# Patient Record
Sex: Female | Born: 1995 | Race: White | Hispanic: No | Marital: Single | Smoking: Never smoker
Health system: Southern US, Community
[De-identification: ages and names within clinical notes are randomized; demographics above are authoritative.]

## PROBLEM LIST (undated history)

## (undated) DIAGNOSIS — R51 Headache: Secondary | ICD-10-CM

## (undated) DIAGNOSIS — H9190 Unspecified hearing loss, unspecified ear: Secondary | ICD-10-CM

## (undated) HISTORY — DX: Headache: R51

## (undated) HISTORY — DX: Unspecified hearing loss, unspecified ear: H91.90

## (undated) HISTORY — PX: INNER EAR SURGERY: SHX679

---
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
## 2014-02-26 ENCOUNTER — Telehealth: Payer: Self-pay | Admitting: *Deleted

## 2014-02-26 DIAGNOSIS — G43919 Migraine, unspecified, intractable, without status migrainosus: Secondary | ICD-10-CM

## 2014-02-26 MED ORDER — SUMATRIPTAN SUCCINATE 100 MG PO TABS: 100.0000 mg | ORAL_TABLET | ORAL | Status: DC | PRN

## 2014-02-26 NOTE — Telephone Encounter (Signed)
I have spoke with patient and let her know that we are starting her on Imitrex because her insurance will not pay for the Maxalt.  I have sent in the Imitrex to her pharmacy.

## 2014-02-26 NOTE — Telephone Encounter (Signed)
This is the wrong patient

## 2014-05-28 ENCOUNTER — Ambulatory Visit (INDEPENDENT_AMBULATORY_CARE_PROVIDER_SITE_OTHER): Payer: Self-pay | Admitting: Nurse Practitioner

## 2014-05-28 ENCOUNTER — Encounter: Payer: Self-pay | Admitting: Nurse Practitioner

## 2014-05-28 VITALS — BP 112/82 | HR 84 | Ht <= 58 in | Wt 135.0 lb

## 2014-05-28 DIAGNOSIS — N946 Dysmenorrhea, unspecified: Secondary | ICD-10-CM

## 2014-05-28 DIAGNOSIS — Z308 Encounter for other contraceptive management: Secondary | ICD-10-CM

## 2014-05-28 MED ORDER — NORETHINDRONE ACET-ETHINYL EST 1.5-30 MG-MCG PO TABS: 1.0000 | ORAL_TABLET | Freq: Every day | ORAL | Status: DC

## 2014-05-28 NOTE — Progress Notes (Signed)
History:  Yvonne Hood is a 18 y.o. No obstetric history on file. who presents to clinic today for follow up on migraine and birth control. She has neen doing very well with migraine and takes Imitrex when she gets one. She gets about 1-2 migraines per month. She needs her BCPs refilled. She has been off this past 2 weeks. She is sexually active with the same partner for one year. They also use condoms. She will consider the Nuvaring study and will take some samples of Nuvaring.  The following portions of the patient's history were reviewed and updated as appropriate: allergies, current medications, past family history, past medical history, past social history, past surgical history and problem list.  Review of Systems:    Objective:  Physical Exam BP 112/82 mmHg  Pulse 84  Ht 4\' 10"  (1.473 m)  Wt 135 lb (61.236 kg)  BMI 28.22 kg/m2  LMP 04/30/2014 (Approximate) GENERAL: Well-developed, well-nourished female in no acute distress.  HEENT: Normocephalic, atraumatic.  NECK: Supple. Normal thyroid.  LUNGS: Normal rate. Clear to auscultation bilaterally.  HEART: Regular rate and rhythm with no adventitious sounds.  EXTREMITIES: No cyanosis, clubbing, or edema, 2+ distal pulses.   Labs and Imaging No results found.  Assessment & Plan:  Assessment:  Migraine without Aura Contraception Management  Plans:  Doing well with migraine/ no refills needed Refill Loestrin BCP x 1 year Nuvaring # 3 samples/ she is aware she will use one or the other RTC 1 year or prn   Delbert PhenixLinda M Seiya Silsby, NP 05/28/2014 1:46 PM

## 2014-05-28 NOTE — Patient Instructions (Signed)

## 2014-09-03 ENCOUNTER — Emergency Department: Payer: Self-pay | Admitting: Student

## 2014-11-29 ENCOUNTER — Emergency Department
Admission: EM | Admit: 2014-11-29 | Discharge: 2014-11-29 | Discharge status: Against Medical Advice | Payer: Medicaid Other | Attending: Emergency Medicine | Admitting: Emergency Medicine

## 2014-11-29 ENCOUNTER — Encounter: Payer: Self-pay | Admitting: Emergency Medicine

## 2014-11-29 DIAGNOSIS — Z3201 Encounter for pregnancy test, result positive: Secondary | ICD-10-CM | POA: Insufficient documentation

## 2014-11-29 DIAGNOSIS — R109 Unspecified abdominal pain: Secondary | ICD-10-CM | POA: Diagnosis not present

## 2014-11-29 DIAGNOSIS — R110 Nausea: Secondary | ICD-10-CM | POA: Insufficient documentation

## 2014-11-29 LAB — HCG, QUANTITATIVE, PREGNANCY: hCG, Beta Chain, Quant, S: 111066 m[IU]/mL — ABNORMAL HIGH (ref <5)

## 2014-11-29 LAB — POCT PREGNANCY, URINE: PREG TEST UR: POSITIVE — AB

## 2014-11-29 LAB — COMPREHENSIVE METABOLIC PANEL
ALK PHOS: 70 U/L (ref 38–126)
ALT: 15 U/L (ref 14–54)
AST: 18 U/L (ref 15–41)
Albumin: 4 g/dL (ref 3.5–5.0)
Anion gap: 9 (ref 5–15)
BUN: 12 mg/dL (ref 6–20)
CHLORIDE: 106 mmol/L (ref 101–111)
CO2: 23 mmol/L (ref 22–32)
Calcium: 9.3 mg/dL (ref 8.9–10.3)
Creatinine, Ser: 0.68 mg/dL (ref 0.44–1.00)
GFR calc Af Amer: >60 mL/min (ref >60)
GLUCOSE: 97 mg/dL (ref 65–99)
POTASSIUM: 3.5 mmol/L (ref 3.5–5.1)
SODIUM: 138 mmol/L (ref 135–145)
Total Bilirubin: 0.5 mg/dL (ref 0.3–1.2)
Total Protein: 7 g/dL (ref 6.5–8.1)

## 2014-11-29 LAB — CBC WITH DIFFERENTIAL/PLATELET
BASOS PCT: 0 %
Basophils Absolute: 0 10*3/uL (ref 0–0.1)
EOS ABS: 0.1 10*3/uL (ref 0–0.7)
Eosinophils Relative: 1 %
HCT: 41 % (ref 35.0–47.0)
HEMOGLOBIN: 13.8 g/dL (ref 12.0–16.0)
Lymphocytes Relative: 22 %
Lymphs Abs: 3.5 10*3/uL (ref 1.0–3.6)
MCH: 29.5 pg (ref 26.0–34.0)
MCHC: 33.6 g/dL (ref 32.0–36.0)
MCV: 87.8 fL (ref 80.0–100.0)
Monocytes Absolute: 1.1 10*3/uL — ABNORMAL HIGH (ref 0.2–0.9)
Monocytes Relative: 7 %
Neutro Abs: 10.8 10*3/uL — ABNORMAL HIGH (ref 1.4–6.5)
Neutrophils Relative %: 70 %
PLATELETS: 370 10*3/uL (ref 150–440)
RBC: 4.67 MIL/uL (ref 3.80–5.20)
RDW: 12.6 % (ref 11.5–14.5)
WBC: 15.6 10*3/uL — ABNORMAL HIGH (ref 3.6–11.0)

## 2014-11-29 LAB — URINALYSIS COMPLETE WITH MICROSCOPIC (ARMC ONLY)
Bacteria, UA: NONE SEEN
Bilirubin Urine: NEGATIVE
Glucose, UA: NEGATIVE mg/dL
Hgb urine dipstick: NEGATIVE
KETONES UR: NEGATIVE mg/dL
Nitrite: NEGATIVE
PH: 5 (ref 5.0–8.0)
Protein, ur: NEGATIVE mg/dL
RBC / HPF: NONE SEEN RBC/hpf (ref 0–5)
SPECIFIC GRAVITY, URINE: 1.03 (ref 1.005–1.030)

## 2014-11-29 LAB — LIPASE, BLOOD: Lipase: 30 U/L (ref 22–51)

## 2014-11-29 NOTE — ED Notes (Signed)
Called no answer in lobby  

## 2014-11-29 NOTE — ED Notes (Signed)
Called times 2  No answer in lobby 

## 2014-11-29 NOTE — ED Notes (Signed)
Pt to ed with c/o abd pain x 2 weeks with nausea.  Pt denies diarrhea states last BM was 2 night ago and normal.

## 2014-12-02 ENCOUNTER — Telehealth: Payer: Self-pay | Admitting: Emergency Medicine

## 2014-12-02 NOTE — ED Notes (Signed)
Called patient due to lwot to inquire about condition and follow up plans. Left message.   

## 2014-12-17 ENCOUNTER — Encounter: Payer: Self-pay | Admitting: Family Medicine

## 2014-12-17 ENCOUNTER — Other Ambulatory Visit: Payer: Self-pay | Admitting: Family Medicine

## 2014-12-17 ENCOUNTER — Ambulatory Visit (INDEPENDENT_AMBULATORY_CARE_PROVIDER_SITE_OTHER): Payer: Medicaid Other | Admitting: Family Medicine

## 2014-12-17 VITALS — BP 95/65 | HR 88 | Wt 137.0 lb

## 2014-12-17 DIAGNOSIS — Z3401 Encounter for supervision of normal first pregnancy, first trimester: Secondary | ICD-10-CM

## 2014-12-17 DIAGNOSIS — Z3400 Encounter for supervision of normal first pregnancy, unspecified trimester: Secondary | ICD-10-CM | POA: Insufficient documentation

## 2014-12-17 LAB — OB RESULTS CONSOLE GC/CHLAMYDIA
Chlamydia: NEGATIVE
Gonorrhea: NEGATIVE

## 2014-12-17 NOTE — Patient Instructions (Signed)
First Trimester of Pregnancy The first trimester of pregnancy is from week 1 until the end of week 12 (months 1 through 3). A week after a sperm fertilizes an egg, the egg will implant on the wall of the uterus. This embryo will begin to develop into a baby. Genes from you and your partner are forming the baby. The female genes determine whether the baby is a boy or a girl. At 6-8 weeks, the eyes and face are formed, and the heartbeat can be seen on ultrasound. At the end of 12 weeks, all the baby's organs are formed.  Now that you are pregnant, you will want to do everything you can to have a healthy baby. Two of the most important things are to get good prenatal care and to follow your health care provider's instructions. Prenatal care is all the medical care you receive before the baby's birth. This care will help prevent, find, and treat any problems during the pregnancy and childbirth. BODY CHANGES Your body goes through many changes during pregnancy. The changes vary from woman to woman.   You may gain or lose a couple of pounds at first.  You may feel sick to your stomach (nauseous) and throw up (vomit). If the vomiting is uncontrollable, call your health care provider.  You may tire easily.  You may develop headaches that can be relieved by medicines approved by your health care provider.  You may urinate more often. Painful urination may mean you have a bladder infection.  You may develop heartburn as a result of your pregnancy.  You may develop constipation because certain hormones are causing the muscles that push waste through your intestines to slow down.  You may develop hemorrhoids or swollen, bulging veins (varicose veins).  Your breasts may begin to grow larger and become tender. Your nipples may stick out more, and the tissue that surrounds them (areola) may become darker.  Your gums may bleed and may be sensitive to brushing and flossing.  Dark spots or blotches  (chloasma, mask of pregnancy) may develop on your face. This will likely fade after the baby is born.  Your menstrual periods will stop.  You may have a loss of appetite.  You may develop cravings for certain kinds of food.  You may have changes in your emotions from day to day, such as being excited to be pregnant or being concerned that something may go wrong with the pregnancy and baby.  You may have more vivid and strange dreams.  You may have changes in your hair. These can include thickening of your hair, rapid growth, and changes in texture. Some women also have hair loss during or after pregnancy, or hair that feels dry or thin. Your hair will most likely return to normal after your baby is born. WHAT TO EXPECT AT YOUR PRENATAL VISITS During a routine prenatal visit:  You will be weighed to make sure you and the baby are growing normally.  Your blood pressure will be taken.  Your abdomen will be measured to track your baby's growth.  The fetal heartbeat will be listened to starting around week 10 or 12 of your pregnancy.  Test results from any previous visits will be discussed. Your health care provider may ask you:  How you are feeling.  If you are feeling the baby move.  If you have had any abnormal symptoms, such as leaking fluid, bleeding, severe headaches, or abdominal cramping.  If you have any questions. Other tests   that may be performed during your first trimester include:  Blood tests to find your blood type and to check for the presence of any previous infections. They will also be used to check for low iron levels (anemia) and Rh antibodies. Later in the pregnancy, blood tests for diabetes will be done along with other tests if problems develop.  Urine tests to check for infections, diabetes, or protein in the urine.  An ultrasound to confirm the proper growth and development of the baby.  An amniocentesis to check for possible genetic problems.  Fetal  screens for spina bifida and Down syndrome.  You may need other tests to make sure you and the baby are doing well. HOME CARE INSTRUCTIONS  Medicines  Follow your health care provider's instructions regarding medicine use. Specific medicines may be either safe or unsafe to take during pregnancy.  Take your prenatal vitamins as directed.  If you develop constipation, try taking a stool softener if your health care provider approves. Diet  Eat regular, well-balanced meals. Choose a variety of foods, such as meat or vegetable-based protein, fish, milk and low-fat dairy products, vegetables, fruits, and whole grain breads and cereals. Your health care provider will help you determine the amount of weight gain that is right for you.  Avoid raw meat and uncooked cheese. These carry germs that can cause birth defects in the baby.  Eating four or five small meals rather than three large meals a day may help relieve nausea and vomiting. If you start to feel nauseous, eating a few soda crackers can be helpful. Drinking liquids between meals instead of during meals also seems to help nausea and vomiting.  If you develop constipation, eat more high-fiber foods, such as fresh vegetables or fruit and whole grains. Drink enough fluids to keep your urine clear or pale yellow. Activity and Exercise  Exercise only as directed by your health care provider. Exercising will help you:  Control your weight.  Stay in shape.  Be prepared for labor and delivery.  Experiencing pain or cramping in the lower abdomen or low back is a good sign that you should stop exercising. Check with your health care provider before continuing normal exercises.  Try to avoid standing for long periods of time. Move your legs often if you must stand in one place for a long time.  Avoid heavy lifting.  Wear low-heeled shoes, and practice good posture.  You may continue to have sex unless your health care provider directs you  otherwise. Relief of Pain or Discomfort  Wear a good support bra for breast tenderness.   Take warm sitz baths to soothe any pain or discomfort caused by hemorrhoids. Use hemorrhoid cream if your health care provider approves.   Rest with your legs elevated if you have leg cramps or low back pain.  If you develop varicose veins in your legs, wear support hose. Elevate your feet for 15 minutes, 3-4 times a day. Limit salt in your diet. Prenatal Care  Schedule your prenatal visits by the twelfth week of pregnancy. They are usually scheduled monthly at first, then more often in the last 2 months before delivery.  Write down your questions. Take them to your prenatal visits.  Keep all your prenatal visits as directed by your health care provider. Safety  Wear your seat belt at all times when driving.  Make a list of emergency phone numbers, including numbers for family, friends, the hospital, and police and fire departments. General Tips    Ask your health care provider for a referral to a local prenatal education class. Begin classes no later than at the beginning of month 6 of your pregnancy.  Ask for help if you have counseling or nutritional needs during pregnancy. Your health care provider can offer advice or refer you to specialists for help with various needs.  Do not use hot tubs, steam rooms, or saunas.  Do not douche or use tampons or scented sanitary pads.  Do not cross your legs for long periods of time.  Avoid cat litter boxes and soil used by cats. These carry germs that can cause birth defects in the baby and possibly loss of the fetus by miscarriage or stillbirth.  Avoid all smoking, herbs, alcohol, and medicines not prescribed by your health care provider. Chemicals in these affect the formation and growth of the baby.  Schedule a dentist appointment. At home, brush your teeth with a soft toothbrush and be gentle when you floss. SEEK MEDICAL CARE IF:   You have  dizziness.  You have mild pelvic cramps, pelvic pressure, or nagging pain in the abdominal area.  You have persistent nausea, vomiting, or diarrhea.  You have a bad smelling vaginal discharge.  You have pain with urination.  You notice increased swelling in your face, hands, legs, or ankles. SEEK IMMEDIATE MEDICAL CARE IF:   You have a fever.  You are leaking fluid from your vagina.  You have spotting or bleeding from your vagina.  You have severe abdominal cramping or pain.  You have rapid weight gain or loss.  You vomit blood or material that looks like coffee grounds.  You are exposed to German measles and have never had them.  You are exposed to fifth disease or chickenpox.  You develop a severe headache.  You have shortness of breath.  You have any kind of trauma, such as from a fall or a car accident. Document Released: 05/25/2001 Document Revised: 10/15/2013 Document Reviewed: 04/10/2013 [employer] Patient Information 2015 [employer]. This information is not intended to replace advice given to you by your health care provider. Make sure you discuss any questions you have with your health care provider.  Breastfeeding Deciding to breastfeed is one of the best choices you can make for you and your baby. A change in hormones during pregnancy causes your breast tissue to grow and increases the number and size of your milk ducts. These hormones also allow proteins, sugars, and fats from your blood supply to make breast milk in your milk-producing glands. Hormones prevent breast milk from being released before your baby is born as well as prompt milk flow after birth. Once breastfeeding has begun, thoughts of your baby, as well as his or her sucking or crying, can stimulate the release of milk from your milk-producing glands.  BENEFITS OF BREASTFEEDING For Your Baby  Your first milk (colostrum) helps your baby's digestive system function better.   There are antibodies  in your milk that help your baby fight off infections.   Your baby has a lower incidence of asthma, allergies, and sudden infant death syndrome.   The nutrients in breast milk are better for your baby than infant formulas and are designed uniquely for your baby's needs.   Breast milk improves your baby's brain development.   Your baby is less likely to develop other conditions, such as childhood obesity, asthma, or type 2 diabetes mellitus.  For You   Breastfeeding helps to create a very special bond between   you and your baby.   Breastfeeding is convenient. Breast milk is always available at the correct temperature and costs nothing.   Breastfeeding helps to burn calories and helps you lose the weight gained during pregnancy.   Breastfeeding makes your uterus contract to its prepregnancy size faster and slows bleeding (lochia) after you give birth.   Breastfeeding helps to lower your risk of developing type 2 diabetes mellitus, osteoporosis, and breast or ovarian cancer later in life. SIGNS THAT YOUR BABY IS HUNGRY Early Signs of Hunger  Increased alertness or activity.  Stretching.  Movement of the head from side to side.  Movement of the head and opening of the mouth when the corner of the mouth or cheek is stroked (rooting).  Increased sucking sounds, smacking lips, cooing, sighing, or squeaking.  Hand-to-mouth movements.  Increased sucking of fingers or hands. Late Signs of Hunger  Fussing.  Intermittent crying. Extreme Signs of Hunger Signs of extreme hunger will require calming and consoling before your baby will be able to breastfeed successfully. Do not wait for the following signs of extreme hunger to occur before you initiate breastfeeding:   Restlessness.  A loud, strong cry.   Screaming. BREASTFEEDING BASICS Breastfeeding Initiation  Find a comfortable place to sit or lie down, with your neck and back well supported.  Place a pillow or  rolled up blanket under your baby to bring him or her to the level of your breast (if you are seated). Nursing pillows are specially designed to help support your arms and your baby while you breastfeed.  Make sure that your baby's abdomen is facing your abdomen.   Gently massage your breast. With your fingertips, massage from your chest wall toward your nipple in a circular motion. This encourages milk flow. You may need to continue this action during the feeding if your milk flows slowly.  Support your breast with 4 fingers underneath and your thumb above your nipple. Make sure your fingers are well away from your nipple and your baby's mouth.   Stroke your baby's lips gently with your finger or nipple.   When your baby's mouth is open wide enough, quickly bring your baby to your breast, placing your entire nipple and as much of the colored area around your nipple (areola) as possible into your baby's mouth.   More areola should be visible above your baby's upper lip than below the lower lip.   Your baby's tongue should be between his or her lower gum and your breast.   Ensure that your baby's mouth is correctly positioned around your nipple (latched). Your baby's lips should create a seal on your breast and be turned out (everted).  It is common for your baby to suck about 2-3 minutes in order to start the flow of breast milk. Latching Teaching your baby how to latch on to your breast properly is very important. An improper latch can cause nipple pain and decreased milk supply for you and poor weight gain in your baby. Also, if your baby is not latched onto your nipple properly, he or she may swallow some air during feeding. This can make your baby fussy. Burping your baby when you switch breasts during the feeding can help to get rid of the air. However, teaching your baby to latch on properly is still the best way to prevent fussiness from swallowing air while breastfeeding. Signs  that your baby has successfully latched on to your nipple:    Silent tugging or silent   sucking, without causing you pain.   Swallowing heard between every 3-4 sucks.    Muscle movement above and in front of his or her ears while sucking.  Signs that your baby has not successfully latched on to nipple:   Sucking sounds or smacking sounds from your baby while breastfeeding.  Nipple pain. If you think your baby has not latched on correctly, slip your finger into the corner of your baby's mouth to break the suction and place it between your baby's gums. Attempt breastfeeding initiation again. Signs of Successful Breastfeeding Signs from your baby:   A gradual decrease in the number of sucks or complete cessation of sucking.   Falling asleep.   Relaxation of his or her body.   Retention of a small amount of milk in his or her mouth.   Letting go of your breast by himself or herself. Signs from you:  Breasts that have increased in firmness, weight, and size 1-3 hours after feeding.   Breasts that are softer immediately after breastfeeding.  Increased milk volume, as well as a change in milk consistency and color by the fifth day of breastfeeding.   Nipples that are not sore, cracked, or bleeding. Signs That Your Baby is Getting Enough Milk  Wetting at least 3 diapers in a 24-hour period. The urine should be clear and pale yellow by age 5 days.  At least 3 stools in a 24-hour period by age 5 days. The stool should be soft and yellow.  At least 3 stools in a 24-hour period by age 7 days. The stool should be seedy and yellow.  No loss of weight greater than 10% of birth weight during the first 3 days of age.  Average weight gain of 4-7 ounces (113-198 g) per week after age 4 days.  Consistent daily weight gain by age 5 days, without weight loss after the age of 2 weeks. After a feeding, your baby may spit up a small amount. This is common. BREASTFEEDING FREQUENCY AND  DURATION Frequent feeding will help you make more milk and can prevent sore nipples and breast engorgement. Breastfeed when you feel the need to reduce the fullness of your breasts or when your baby shows signs of hunger. This is called "breastfeeding on demand." Avoid introducing a pacifier to your baby while you are working to establish breastfeeding (the first 4-6 weeks after your baby is born). After this time you may choose to use a pacifier. Research has shown that pacifier use during the first year of a baby's life decreases the risk of sudden infant death syndrome (SIDS). Allow your baby to feed on each breast as long as he or she wants. Breastfeed until your baby is finished feeding. When your baby unlatches or falls asleep while feeding from the first breast, offer the second breast. Because newborns are often sleepy in the first few weeks of life, you may need to awaken your baby to get him or her to feed. Breastfeeding times will vary from baby to baby. However, the following rules can serve as a guide to help you ensure that your baby is properly fed:  Newborns (babies 4 weeks of age or younger) may breastfeed every 1-3 hours.  Newborns should not go longer than 3 hours during the day or 5 hours during the night without breastfeeding.  You should breastfeed your baby a minimum of 8 times in a 24-hour period until you begin to introduce solid foods to your baby at around 6   months of age. BREAST MILK PUMPING Pumping and storing breast milk allows you to ensure that your baby is exclusively fed your breast milk, even at times when you are unable to breastfeed. This is especially important if you are going back to work while you are still breastfeeding or when you are not able to be present during feedings. Your lactation consultant can give you guidelines on how long it is safe to store breast milk.  A breast pump is a machine that allows you to pump milk from your breast into a sterile bottle.  The pumped breast milk can then be stored in a refrigerator or freezer. Some breast pumps are operated by hand, while others use electricity. Ask your lactation consultant which type will work best for you. Breast pumps can be purchased, but some hospitals and breastfeeding support groups lease breast pumps on a monthly basis. A lactation consultant can teach you how to hand express breast milk, if you prefer not to use a pump.  CARING FOR YOUR BREASTS WHILE YOU BREASTFEED Nipples can become dry, cracked, and sore while breastfeeding. The following recommendations can help keep your breasts moisturized and healthy:  Avoid using soap on your nipples.   Wear a supportive bra. Although not required, special nursing bras and tank tops are designed to allow access to your breasts for breastfeeding without taking off your entire bra or top. Avoid wearing underwire-style bras or extremely tight bras.  Air dry your nipples for 3-4minutes after each feeding.   Use only cotton bra pads to absorb leaked breast milk. Leaking of breast milk between feedings is normal.   Use lanolin on your nipples after breastfeeding. Lanolin helps to maintain your skin's normal moisture barrier. If you use pure lanolin, you do not need to wash it off before feeding your baby again. Pure lanolin is not toxic to your baby. You may also hand express a few drops of breast milk and gently massage that milk into your nipples and allow the milk to air dry. In the first few weeks after giving birth, some women experience extremely full breasts (engorgement). Engorgement can make your breasts feel heavy, warm, and tender to the touch. Engorgement peaks within 3-5 days after you give birth. The following recommendations can help ease engorgement:  Completely empty your breasts while breastfeeding or pumping. You may want to start by applying warm, moist heat (in the shower or with warm water-soaked hand towels) just before feeding or  pumping. This increases circulation and helps the milk flow. If your baby does not completely empty your breasts while breastfeeding, pump any extra milk after he or she is finished.  Wear a snug bra (nursing or regular) or tank top for 1-2 days to signal your body to slightly decrease milk production.  Apply ice packs to your breasts, unless this is too uncomfortable for you.  Make sure that your baby is latched on and positioned properly while breastfeeding. If engorgement persists after 48 hours of following these recommendations, contact your health care provider or a lactation consultant. OVERALL HEALTH CARE RECOMMENDATIONS WHILE BREASTFEEDING  Eat healthy foods. Alternate between meals and snacks, eating 3 of each per day. Because what you eat affects your breast milk, some of the foods may make your baby more irritable than usual. Avoid eating these foods if you are sure that they are negatively affecting your baby.  Drink milk, fruit juice, and water to satisfy your thirst (about 10 glasses a day).   Rest   often, relax, and continue to take your prenatal vitamins to prevent fatigue, stress, and anemia.  Continue breast self-awareness checks.  Avoid chewing and smoking tobacco.  Avoid alcohol and drug use. Some medicines that may be harmful to your baby can pass through breast milk. It is important to ask your health care provider before taking any medicine, including all over-the-counter and prescription medicine as well as vitamin and herbal supplements. It is possible to become pregnant while breastfeeding. If birth control is desired, ask your health care provider about options that will be safe for your baby. SEEK MEDICAL CARE IF:   You feel like you want to stop breastfeeding or have become frustrated with breastfeeding.  You have painful breasts or nipples.  Your nipples are cracked or bleeding.  Your breasts are red, tender, or warm.  You have a swollen area on either  breast.  You have a fever or chills.  You have nausea or vomiting.  You have drainage other than breast milk from your nipples.  Your breasts do not become full before feedings by the fifth day after you give birth.  You feel sad and depressed.  Your baby is too sleepy to eat well.  Your baby is having trouble sleeping.   Your baby is wetting less than 3 diapers in a 24-hour period.  Your baby has less than 3 stools in a 24-hour period.  Your baby's skin or the white part of his or her eyes becomes yellow.   Your baby is not gaining weight by 5 days of age. SEEK IMMEDIATE MEDICAL CARE IF:   Your baby is overly tired (lethargic) and does not want to wake up and feed.  Your baby develops an unexplained fever. Document Released: 05/31/2005 Document Revised: 06/05/2013 Document Reviewed: 11/22/2012 [employer] Patient Information 2015 [employer]. This information is not intended to replace advice given to you by your health care provider. Make sure you discuss any questions you have with your health care provider.  

## 2014-12-17 NOTE — Progress Notes (Signed)
   Subjective:    Yvonne Hood is a G1P0000 1488w6d being seen today for her first obstetrical visit.  Her obstetrical history is not significant. Pregnancy history fully reviewed.  Patient reports nausea and vomiting.  There were no vitals filed for this visit.  HISTORY: OB History  Gravida Para Term Preterm AB SAB TAB Ectopic Multiple Living  1 0 0 0 0 0 0 0 0 0     # Outcome Date GA Lbr Len/2nd Weight Sex Delivery Anes PTL Lv  1 Current              Past Medical History  Diagnosis Date  . ZOXWRUEA(540.9Headache(784.0)    Past Surgical History  Procedure Laterality Date  . Inner ear surgery      left ear   Family History  Problem Relation Age of Onset  . Diabetes Mother   . COPD Mother   . Heart disease Father      Exam    Uterus:   10 wk size  Pelvic Exam:    Perineum: Normal Perineum   Vulva: Bartholin's, Urethra, Skene's normal   Vagina:  normal mucosa, normal discharge   Cervix: no bleeding following Pap and no cervical motion tenderness   Adnexa: normal adnexa   Bony Pelvis: average  System: Breast:  normal appearance, no masses or tenderness   Skin: normal coloration and turgor, no rashes    Neurologic: oriented   Extremities: normal strength, tone, and muscle mass   HEENT sclera clear, anicteric   Mouth/Teeth mucous membranes moist, pharynx normal without lesions   Neck supple   Cardiovascular: regular rate and rhythm, no murmurs or gallops   Respiratory:  appears well, vitals normal, no respiratory distress, acyanotic, normal RR, ear and throat exam is normal, neck free of mass or lymphadenopathy, chest clear, no wheezing, crepitations, rhonchi, normal symmetric air entry   Abdomen: soft, non-tender; bowel sounds normal; no masses,  no organomegaly      Assessment:    Pregnancy: G1P0000 Patient Active Problem List   Diagnosis Date Noted  . Migraine without aura 04/25/2012  . Birth control 07/13/2011  . Adolescent dysmenorrhea 07/13/2011         Plan:     Initial labs drawn. Prenatal vitamins. Problem list reviewed and updated. Genetic Screening discussed First Screen: declined.  Follow up in 4 weeks.    Wava Kildow S 12/17/2014

## 2014-12-18 LAB — PRENATAL PROFILE (SOLSTAS)
ANTIBODY SCREEN: NEGATIVE
BASOS PCT: 0 % (ref 0–1)
Basophils Absolute: 0 10*3/uL (ref 0.0–0.1)
EOS ABS: 0.1 10*3/uL (ref 0.0–0.7)
EOS PCT: 1 % (ref 0–5)
HCT: 38.1 % (ref 36.0–46.0)
HEMOGLOBIN: 12.9 g/dL (ref 12.0–15.0)
HEP B S AG: NEGATIVE
HIV 1&2 Ab, 4th Generation: NONREACTIVE
LYMPHS ABS: 2.3 10*3/uL (ref 0.7–4.0)
Lymphocytes Relative: 21 % (ref 12–46)
MCH: 29.3 pg (ref 26.0–34.0)
MCHC: 33.9 g/dL (ref 30.0–36.0)
MCV: 86.6 fL (ref 78.0–100.0)
MPV: 9.8 fL (ref 8.6–12.4)
Monocytes Absolute: 0.8 10*3/uL (ref 0.1–1.0)
Monocytes Relative: 7 % (ref 3–12)
NEUTROS PCT: 71 % (ref 43–77)
Neutro Abs: 7.8 10*3/uL — ABNORMAL HIGH (ref 1.7–7.7)
Platelets: 346 10*3/uL (ref 150–400)
RBC: 4.4 MIL/uL (ref 3.87–5.11)
RDW: 13.2 % (ref 11.5–15.5)
RUBELLA: 0.98 {index} — AB (ref <0.90)
Rh Type: POSITIVE
WBC: 11 10*3/uL — ABNORMAL HIGH (ref 4.0–10.5)

## 2014-12-18 LAB — GC/CHLAMYDIA PROBE AMP
CT Probe RNA: NEGATIVE
GC Probe RNA: NEGATIVE

## 2014-12-18 LAB — CYSTIC FIBROSIS DIAGNOSTIC STUDY

## 2014-12-19 ENCOUNTER — Encounter: Payer: Self-pay | Admitting: Family Medicine

## 2014-12-19 DIAGNOSIS — O9989 Other specified diseases and conditions complicating pregnancy, childbirth and the puerperium: Secondary | ICD-10-CM

## 2014-12-19 DIAGNOSIS — Z283 Underimmunization status: Secondary | ICD-10-CM | POA: Insufficient documentation

## 2014-12-19 DIAGNOSIS — Z2839 Other underimmunization status: Secondary | ICD-10-CM | POA: Insufficient documentation

## 2014-12-19 LAB — HEMOGLOBINOPATHY EVALUATION
HGB A2 QUANT: 3 % (ref 2.2–3.2)
HGB A: 97 % (ref 96.8–97.8)
Hemoglobin Other: 0 %
Hgb F Quant: 0 % (ref 0.0–2.0)
Hgb S Quant: 0 %

## 2014-12-19 LAB — CULTURE, OB URINE: Colony Count: 9000

## 2015-01-14 ENCOUNTER — Ambulatory Visit (INDEPENDENT_AMBULATORY_CARE_PROVIDER_SITE_OTHER): Payer: Medicaid Other | Admitting: Obstetrics & Gynecology

## 2015-01-14 VITALS — BP 103/71 | HR 92 | Wt 131.0 lb

## 2015-01-14 DIAGNOSIS — O219 Vomiting of pregnancy, unspecified: Secondary | ICD-10-CM

## 2015-01-14 DIAGNOSIS — Z3402 Encounter for supervision of normal first pregnancy, second trimester: Secondary | ICD-10-CM

## 2015-01-14 MED ORDER — METOCLOPRAMIDE HCL 10 MG PO TABS: 10.0000 mg | ORAL_TABLET | Freq: Four times a day (QID) | ORAL | Status: DC | PRN

## 2015-01-14 NOTE — Progress Notes (Signed)
Subjective:  Yvonne Hood is a 19 y.o. G1P0000 at [redacted]w[redacted]d being seen today for ongoing prenatal care.  Patient reports that she sill has nausea and vomiting, usually vomits three times a day.    Vag. Bleeding: None. Movement: Absent. Denies leaking of fluid.   The following portions of the patient's history were reviewed and updated as appropriate: allergies, current medications, past family history, past medical history, past social history, past surgical history and problem list.   Objective:   Filed Vitals:   01/14/15 1333  BP: 103/71  Pulse: 92  Weight: 131 lb (59.421 kg)    Fetal Status: Fetal Heart Rate (bpm): 155   Movement: Absent     General:  Alert, oriented and cooperative. Patient is in no acute distress.  Skin: Skin is warm and dry. No rash noted.   Cardiovascular: Normal heart rate noted  Respiratory: Normal respiratory effort, no problems with respiration noted  Abdomen: Soft, gravid, appropriate for gestational age. Pain/Pressure: Absent     Vaginal: Vag. Bleeding: None.       Cervix: Not evaluated        Extremities: Normal range of motion.  Edema: None  Mental Status: Normal mood and affect. Normal behavior. Normal judgment and thought content.   Urinalysis: Urine Protein: Trace Urine Glucose: Negative  Assessment and Plan:  Pregnancy: G1P0000 at [redacted]w[redacted]d  1. Supervision of normal first pregnancy, antepartum, second trimester - US OB Comp + 14 Wk; Future - Declines quad screen  2. Nausea and vomiting of pregnancy, antepartum - metoCLOPramide (REGLAN) 10 MG tablet; Take 1 tablet (10 mg total) by mouth 4 (four) times daily as needed for nausea or vomiting.  Dispense: 30 tablet; Refill: 2  Routine obstetric precautions reviewed. Please refer to After Visit Summary for other counseling recommendations.  Return in about 4 weeks (around 02/11/2015) for OB Visit.   Tereso Newcomer, MD

## 2015-01-14 NOTE — Patient Instructions (Signed)
Return to clinic for any obstetric concerns or go to [facility] for evaluation Second Trimester of Pregnancy The second trimester is from week 13 through week 28, months 4 through 6. The second trimester is often a time when you feel your best. Your body has also adjusted to being pregnant, and you begin to feel better physically. Usually, morning sickness has lessened or quit completely, you may have more energy, and you may have an increase in appetite. The second trimester is also a time when the fetus is growing rapidly. At the end of the sixth month, the fetus is about 9 inches long and weighs about 1 pounds. You will likely begin to feel the baby move (quickening) between 18 and 20 weeks of the pregnancy. BODY CHANGES Your body goes through many changes during pregnancy. The changes vary from woman to woman.   Your weight will continue to increase. You will notice your lower abdomen bulging out.  You may begin to get stretch marks on your hips, abdomen, and breasts.  You may develop headaches that can be relieved by medicines approved by your health care provider.  You may urinate more often because the fetus is pressing on your bladder.  You may develop or continue to have heartburn as a result of your pregnancy.  You may develop constipation because certain hormones are causing the muscles that push waste through your intestines to slow down.  You may develop hemorrhoids or swollen, bulging veins (varicose veins).  You may have back pain because of the weight gain and pregnancy hormones relaxing your joints between the bones in your pelvis and as a result of a shift in weight and the muscles that support your balance.  Your breasts will continue to grow and be tender.  Your gums may bleed and may be sensitive to brushing and flossing.  Dark spots or blotches (chloasma, mask of pregnancy) may develop on your face. This will likely fade after the baby is born.  A dark line from your belly  button to the pubic area (linea nigra) may appear. This will likely fade after the baby is born.  You may have changes in your hair. These can include thickening of your hair, rapid growth, and changes in texture. Some women also have hair loss during or after pregnancy, or hair that feels dry or thin. Your hair will most likely return to normal after your baby is born. WHAT TO EXPECT AT YOUR PRENATAL VISITS During a routine prenatal visit:  You will be weighed to make sure you and the fetus are growing normally.  Your blood pressure will be taken.  Your abdomen will be measured to track your baby's growth.  The fetal heartbeat will be listened to.  Any test results from the previous visit will be discussed. Your health care provider may ask you:  How you are feeling.  If you are feeling the baby move.  If you have had any abnormal symptoms, such as leaking fluid, bleeding, severe headaches, or abdominal cramping.  If you have any questions. Other tests that may be performed during your second trimester include:  Blood tests that check for:  Low iron levels (anemia).  Gestational diabetes (between 24 and 28 weeks).  Rh antibodies.  Urine tests to check for infections, diabetes, or protein in the urine.  An ultrasound to confirm the proper growth and development of the baby.  An amniocentesis to check for possible genetic problems.  Fetal screens for spina bifida and Down  syndrome. HOME CARE INSTRUCTIONS   Avoid all smoking, herbs, alcohol, and unprescribed drugs. These chemicals affect the formation and growth of the baby.  Follow your health care provider's instructions regarding medicine use. There are medicines that are either safe or unsafe to take during pregnancy.  Exercise only as directed by your health care provider. Experiencing uterine cramps is a good sign to stop exercising.  Continue to eat regular, healthy meals.  Wear a good support bra for breast  tenderness.  Do not use hot tubs, steam rooms, or saunas.  Wear your seat belt at all times when driving.  Avoid raw meat, uncooked cheese, cat litter boxes, and soil used by cats. These carry germs that can cause birth defects in the baby.  Take your prenatal vitamins.  Try taking a stool softener (if your health care provider approves) if you develop constipation. Eat more high-fiber foods, such as fresh vegetables or fruit and whole grains. Drink plenty of fluids to keep your urine clear or pale yellow.  Take warm sitz baths to soothe any pain or discomfort caused by hemorrhoids. Use hemorrhoid cream if your health care provider approves.  If you develop varicose veins, wear support hose. Elevate your feet for 15 minutes, 3-4 times a day. Limit salt in your diet.  Avoid heavy lifting, wear low heel shoes, and practice good posture.  Rest with your legs elevated if you have leg cramps or low back pain.  Visit your dentist if you have not gone yet during your pregnancy. Use a soft toothbrush to brush your teeth and be gentle when you floss.  A sexual relationship may be continued unless your health care provider directs you otherwise.  Continue to go to all your prenatal visits as directed by your health care provider. SEEK MEDICAL CARE IF:   You have dizziness.  You have mild pelvic cramps, pelvic pressure, or nagging pain in the abdominal area.  You have persistent nausea, vomiting, or diarrhea.  You have a bad smelling vaginal discharge.  You have pain with urination. SEEK IMMEDIATE MEDICAL CARE IF:   You have a fever.  You are leaking fluid from your vagina.  You have spotting or bleeding from your vagina.  You have severe abdominal cramping or pain.  You have rapid weight gain or loss.  You have shortness of breath with chest pain.  You notice sudden or extreme swelling of your face, hands, ankles, feet, or legs.  You have not felt your baby move in over an  hour.  You have severe headaches that do not go away with medicine.  You have vision changes. Document Released: 05/25/2001 Document Revised: 06/05/2013 Document Reviewed: 08/01/2012 [facility] Patient Information 2015 [place], [place]. This information is not intended to replace advice given to you by your health care provider. Make sure you discuss any questions you have with your health care provider.

## 2015-02-11 ENCOUNTER — Encounter: Payer: Self-pay | Admitting: Physician Assistant

## 2015-02-11 ENCOUNTER — Ambulatory Visit (INDEPENDENT_AMBULATORY_CARE_PROVIDER_SITE_OTHER): Payer: Medicaid Other | Admitting: Physician Assistant

## 2015-02-11 VITALS — BP 88/54 | HR 91 | Wt 130.0 lb

## 2015-02-11 DIAGNOSIS — G4440 Drug-induced headache, not elsewhere classified, not intractable: Secondary | ICD-10-CM | POA: Insufficient documentation

## 2015-02-11 DIAGNOSIS — Z3402 Encounter for supervision of normal first pregnancy, second trimester: Secondary | ICD-10-CM

## 2015-02-11 DIAGNOSIS — G43009 Migraine without aura, not intractable, without status migrainosus: Secondary | ICD-10-CM

## 2015-02-11 HISTORY — DX: Drug-induced headache, not elsewhere classified, not intractable: G44.40

## 2015-02-11 MED ORDER — CYCLOBENZAPRINE HCL 10 MG PO TABS: 10.0000 mg | ORAL_TABLET | Freq: Three times a day (TID) | ORAL | Status: DC | PRN

## 2015-02-11 NOTE — Patient Instructions (Signed)
Second Trimester of Pregnancy The second trimester is from week 13 through week 28, months 4 through 6. The second trimester is often a time when you feel your best. Your body has also adjusted to being pregnant, and you begin to feel better physically. Usually, morning sickness has lessened or quit completely, you may have more energy, and you may have an increase in appetite. The second trimester is also a time when the fetus is growing rapidly. At the end of the sixth month, the fetus is about 9 inches long and weighs about 1 pounds. You will likely begin to feel the baby move (quickening) between 18 and 20 weeks of the pregnancy. BODY CHANGES Your body goes through many changes during pregnancy. The changes vary from woman to woman.   Your weight will continue to increase. You will notice your lower abdomen bulging out.  You may begin to get stretch marks on your hips, abdomen, and breasts.  You may develop headaches that can be relieved by medicines approved by your health care provider.  You may urinate more often because the fetus is pressing on your bladder.  You may develop or continue to have heartburn as a result of your pregnancy.  You may develop constipation because certain hormones are causing the muscles that push waste through your intestines to slow down.  You may develop hemorrhoids or swollen, bulging veins (varicose veins).  You may have back pain because of the weight gain and pregnancy hormones relaxing your joints between the bones in your pelvis and as a result of a shift in weight and the muscles that support your balance.  Your breasts will continue to grow and be tender.  Your gums may bleed and may be sensitive to brushing and flossing.  Dark spots or blotches (chloasma, mask of pregnancy) may develop on your face. This will likely fade after the baby is born.  A dark line from your belly button to the pubic area (linea nigra) may appear. This will likely fade  after the baby is born.  You may have changes in your hair. These can include thickening of your hair, rapid growth, and changes in texture. Some women also have hair loss during or after pregnancy, or hair that feels dry or thin. Your hair will most likely return to normal after your baby is born. WHAT TO EXPECT AT YOUR PRENATAL VISITS During a routine prenatal visit:  You will be weighed to make sure you and the fetus are growing normally.  Your blood pressure will be taken.  Your abdomen will be measured to track your baby's growth.  The fetal heartbeat will be listened to.  Any test results from the previous visit will be discussed. Your health care provider may ask you:  How you are feeling.  If you are feeling the baby move.  If you have had any abnormal symptoms, such as leaking fluid, bleeding, severe headaches, or abdominal cramping.  If you have any questions. Other tests that may be performed during your second trimester include:  Blood tests that check for:  Low iron levels (anemia).  Gestational diabetes (between 24 and 28 weeks).  Rh antibodies.  Urine tests to check for infections, diabetes, or protein in the urine.  An ultrasound to confirm the proper growth and development of the baby.  An amniocentesis to check for possible genetic problems.  Fetal screens for spina bifida and Down syndrome. HOME CARE INSTRUCTIONS   Avoid all smoking, herbs, alcohol, and unprescribed   drugs. These chemicals affect the formation and growth of the baby.  Follow your health care provider's instructions regarding medicine use. There are medicines that are either safe or unsafe to take during pregnancy.  Exercise only as directed by your health care provider. Experiencing uterine cramps is a good sign to stop exercising.  Continue to eat regular, healthy meals.  Wear a good support bra for breast tenderness.  Do not use hot tubs, steam rooms, or saunas.  Wear your  seat belt at all times when driving.  Avoid raw meat, uncooked cheese, cat litter boxes, and soil used by cats. These carry germs that can cause birth defects in the baby.  Take your prenatal vitamins.  Try taking a stool softener (if your health care provider approves) if you develop constipation. Eat more high-fiber foods, such as fresh vegetables or fruit and whole grains. Drink plenty of fluids to keep your urine clear or pale yellow.  Take warm sitz baths to soothe any pain or discomfort caused by hemorrhoids. Use hemorrhoid cream if your health care provider approves.  If you develop varicose veins, wear support hose. Elevate your feet for 15 minutes, 3-4 times a day. Limit salt in your diet.  Avoid heavy lifting, wear low heel shoes, and practice good posture.  Rest with your legs elevated if you have leg cramps or low back pain.  Visit your dentist if you have not gone yet during your pregnancy. Use a soft toothbrush to brush your teeth and be gentle when you floss.  A sexual relationship may be continued unless your health care provider directs you otherwise.  Continue to go to all your prenatal visits as directed by your health care provider. SEEK MEDICAL CARE IF:   You have dizziness.  You have mild pelvic cramps, pelvic pressure, or nagging pain in the abdominal area.  You have persistent nausea, vomiting, or diarrhea.  You have a bad smelling vaginal discharge.  You have pain with urination. SEEK IMMEDIATE MEDICAL CARE IF:   You have a fever.  You are leaking fluid from your vagina.  You have spotting or bleeding from your vagina.  You have severe abdominal cramping or pain.  You have rapid weight gain or loss.  You have shortness of breath with chest pain.  You notice sudden or extreme swelling of your face, hands, ankles, feet, or legs.  You have not felt your baby move in over an hour.  You have severe headaches that do not go away with  medicine.  You have vision changes. Document Released: 05/25/2001 Document Revised: 06/05/2013 Document Reviewed: 08/01/2012 [employer] Patient Information 2015 [employer]. This information is not intended to replace advice given to you by your health care provider. Make sure you discuss any questions you have with your health care provider.  

## 2015-02-11 NOTE — Progress Notes (Signed)
Subjective:  Yvonne Hood is a 19 y.o. G1P0000 at [redacted]w[redacted]d being seen today for ongoing prenatal care.  Patient reports headache.  Contractions: Not present.  Vag. Bleeding: None. Movement: Present. Denies leaking of fluid.  Pt notes the HA's are mild.  She awakens with them every day.  They improve slightly with tylenol but worsen again as it wears off.  She notes they have been worse since she stopped caffeine a couple weeks ago.  She cannot  quantify but reports drinking a lot of soda and tea every day previously.  No other meds or treatments tried.   The following portions of the patient's history were reviewed and updated as appropriate: allergies, current medications, past family history, past medical history, past social history, past surgical history and problem list.   Objective:   Filed Vitals:   02/11/15 0933  BP: 88/54  Pulse: 91  Weight: 130 lb (58.968 kg)    Fetal Status: Fetal Heart Rate (bpm): 140   Movement: Present     General:  Alert, oriented and cooperative. Patient is in no acute distress.  Skin: Skin is warm and dry. No rash noted.   Cardiovascular: Normal heart rate noted  Respiratory: Normal respiratory effort, no problems with respiration noted  Abdomen: Soft, gravid, appropriate for gestational age. Pain/Pressure: Absent   Fundal ht 2 fw below umb  Pelvic: Vag. Bleeding: None Vag D/C Character: Thin   Cervical exam deferred        Extremities: Normal range of motion.  Edema: None  Mental Status: Normal mood and affect. Normal behavior. Normal judgment and thought content.   Urinalysis: Urine Protein: Negative Urine Glucose: Negative  Assessment and Plan:  Pregnancy: G1P0000 at [redacted]w[redacted]d  1. Migraine without aura and without status migrainosus, not intractable Flexeril  - take 1/2 tab for mild HA.  May use up to 1 tab TID PRN.  Do not use more than 2-3 days per week.   Hydrate well.  2. Supervision of normal first pregnancy, antepartum, second  trimester Has anatomy scan appt tomorrow  3. Headache, rebound Flexeril  - take 1/2 tab for mild HA.  May use up to 1 tab TID PRN.  Do not use more than 2-3 days per week.   Hydrate well.  Preterm labor symptoms and general obstetric precautions including but not limited to vaginal bleeding, contractions, leaking of fluid and fetal movement were reviewed in detail with the patient. Please refer to After Visit Summary for other counseling recommendations.  Return in about 4 weeks (around 03/11/2015).   Bertram Denver, PA-C

## 2015-02-12 ENCOUNTER — Other Ambulatory Visit: Payer: Self-pay | Admitting: Obstetrics & Gynecology

## 2015-02-12 ENCOUNTER — Ambulatory Visit (HOSPITAL_COMMUNITY)
Admission: RE | Admit: 2015-02-12 | Discharge: 2015-02-12 | Disposition: A | Discharge status: Home / Self Care | Payer: Medicaid Other | Source: Ambulatory Visit | Attending: Obstetrics & Gynecology | Admitting: Obstetrics & Gynecology

## 2015-02-12 DIAGNOSIS — Z3689 Encounter for other specified antenatal screening: Secondary | ICD-10-CM

## 2015-02-12 DIAGNOSIS — Z3A18 18 weeks gestation of pregnancy: Secondary | ICD-10-CM | POA: Diagnosis not present

## 2015-02-12 DIAGNOSIS — Z3402 Encounter for supervision of normal first pregnancy, second trimester: Secondary | ICD-10-CM

## 2015-02-12 DIAGNOSIS — Z36 Encounter for antenatal screening of mother: Secondary | ICD-10-CM | POA: Insufficient documentation

## 2015-03-11 ENCOUNTER — Encounter: Payer: Self-pay | Admitting: Obstetrics & Gynecology

## 2015-03-11 ENCOUNTER — Ambulatory Visit (INDEPENDENT_AMBULATORY_CARE_PROVIDER_SITE_OTHER): Payer: Medicaid Other | Admitting: Obstetrics & Gynecology

## 2015-03-11 VITALS — BP 111/75 | HR 93 | Wt 136.0 lb

## 2015-03-11 DIAGNOSIS — Z3402 Encounter for supervision of normal first pregnancy, second trimester: Secondary | ICD-10-CM

## 2015-03-11 NOTE — Progress Notes (Signed)
Subjective:  Yvonne Hood is a 19 y.o. S W  G1P0000 at [redacted]w[redacted]d being seen today for ongoing prenatal care.  Patient reports cold symptoms.  Contractions: Not present.  Vag. Bleeding: None. Movement: Present. Denies leaking of fluid.   The following portions of the patient's history were reviewed and updated as appropriate: allergies, current medications, past family history, past medical history, past social history, past surgical history and problem list.   Objective:   Filed Vitals:   03/11/15 0935  BP: 111/75  Pulse: 93  Weight: 136 lb (61.689 kg)    Fetal Status: Fetal Heart Rate (bpm): 143 Fundal Height: 22 cm Movement: Present     General:  Alert, oriented and cooperative. Patient is in no acute distress.  Skin: Skin is warm and dry. No rash noted.   Cardiovascular: Normal heart rate noted  Respiratory: Normal respiratory effort, no problems with respiration noted  Abdomen: Soft, gravid, appropriate for gestational age. Pain/Pressure: Absent     Pelvic: Vag. Bleeding: None Vag D/C Character: Thin   Cervical exam deferred        Extremities: Normal range of motion.  Edema: None  Mental Status: Normal mood and affect. Normal behavior. Normal judgment and thought content.   Urinalysis: Urine Protein: Negative Urine Glucose: Negative  Assessment and Plan:  Pregnancy: G1P0000 at [redacted]w[redacted]d  1. Supervision of normal first pregnancy, antepartum, second trimester - flu vaccine, glucola, labs, tdap at next visit - [place] MFM OB FOLLOW UP; Future  Preterm labor symptoms and general obstetric precautions including but not limited to vaginal bleeding, contractions, leaking of fluid and fetal movement were reviewed in detail with the patient. Please refer to After Visit Summary for other counseling recommendations.  Return in about 4 weeks (around 04/08/2015) for glucola.   Allie Bossier, MD

## 2015-03-19 ENCOUNTER — Ambulatory Visit (HOSPITAL_COMMUNITY): Payer: Medicaid Other

## 2015-03-27 ENCOUNTER — Ambulatory Visit (HOSPITAL_COMMUNITY)
Admission: RE | Admit: 2015-03-27 | Discharge: 2015-03-27 | Disposition: A | Discharge status: Home / Self Care | Payer: Medicaid Other | Source: Ambulatory Visit | Attending: Obstetrics & Gynecology | Admitting: Obstetrics & Gynecology

## 2015-03-27 ENCOUNTER — Other Ambulatory Visit: Payer: Self-pay | Admitting: Obstetrics & Gynecology

## 2015-03-27 DIAGNOSIS — Z36 Encounter for antenatal screening of mother: Secondary | ICD-10-CM | POA: Insufficient documentation

## 2015-03-27 DIAGNOSIS — Z0489 Encounter for examination and observation for other specified reasons: Secondary | ICD-10-CM

## 2015-03-27 DIAGNOSIS — Z3A24 24 weeks gestation of pregnancy: Secondary | ICD-10-CM

## 2015-03-27 DIAGNOSIS — Z3402 Encounter for supervision of normal first pregnancy, second trimester: Secondary | ICD-10-CM

## 2015-03-27 DIAGNOSIS — IMO0002 Reserved for concepts with insufficient information to code with codable children: Secondary | ICD-10-CM

## 2015-04-09 ENCOUNTER — Encounter: Payer: Medicaid Other | Admitting: Obstetrics & Gynecology

## 2015-04-11 ENCOUNTER — Ambulatory Visit (INDEPENDENT_AMBULATORY_CARE_PROVIDER_SITE_OTHER): Payer: Medicaid Other | Admitting: Obstetrics & Gynecology

## 2015-04-11 VITALS — BP 115/76 | HR 82 | Wt 140.0 lb

## 2015-04-11 DIAGNOSIS — Z23 Encounter for immunization: Secondary | ICD-10-CM

## 2015-04-11 DIAGNOSIS — Z3402 Encounter for supervision of normal first pregnancy, second trimester: Secondary | ICD-10-CM

## 2015-04-11 NOTE — Progress Notes (Signed)
Subjective:  Yvonne Hood is a 19 y.o. G1P0000 at 1244w2d being seen today for ongoing prenatal care.  Patient reports no complaints.  Contractions: Not present.  Vag. Bleeding: None. Movement: Present. Denies leaking of fluid.   The following portions of the patient's history were reviewed and updated as appropriate: allergies, current medications, past family history, past medical history, past social history, past surgical history and problem list. Problem list updated.  Objective:   Filed Vitals:   04/11/15 0915  BP: 115/76  Pulse: 82  Weight: 140 lb (63.504 kg)    Fetal Status: Fetal Heart Rate (bpm): 146 Fundal Height: 26 cm Movement: Present     General:  Alert, oriented and cooperative. Patient is in no acute distress.  Skin: Skin is warm and dry. No rash noted.   Cardiovascular: Normal heart rate noted  Respiratory: Normal respiratory effort, no problems with respiration noted  Abdomen: Soft, gravid, appropriate for gestational age. Pain/Pressure: Absent     Pelvic: Vag. Bleeding: None Vag D/C Character: Thin  Cervical exam deferred        Extremities: Normal range of motion.  Edema: Trace  Mental Status: Normal mood and affect. Normal behavior. Normal judgment and thought content.   Urinalysis: Urine Protein: Negative Urine Glucose: Negative  Assessment and Plan:  Pregnancy: G1P0000 at 6044w2d  1. Supervision of normal first pregnancy in second trimester - Flu Vaccine QUAD 36+ mos IM (Fluarix, Quad PF) Preterm labor symptoms and general obstetric precautions including but not limited to vaginal bleeding, contractions, leaking of fluid and fetal movement were reviewed in detail with the patient. Please refer to After Visit Summary for other counseling recommendations.  Return in about 3 weeks (around 05/02/2015) for 1 hr GTT, 3rd trimester labs, TDap, OB Visit.   Tereso NewcomerUgonna A Sunshine Mackowski, MD

## 2015-04-11 NOTE — Patient Instructions (Signed)
Return to clinic for any obstetric concerns or go to [facility] for evaluation  

## 2015-05-02 ENCOUNTER — Ambulatory Visit (INDEPENDENT_AMBULATORY_CARE_PROVIDER_SITE_OTHER): Payer: Medicaid Other | Admitting: Family Medicine

## 2015-05-02 VITALS — BP 111/77 | HR 105 | Wt 145.0 lb

## 2015-05-02 DIAGNOSIS — Z3493 Encounter for supervision of normal pregnancy, unspecified, third trimester: Secondary | ICD-10-CM

## 2015-05-02 DIAGNOSIS — Z36 Encounter for antenatal screening of mother: Secondary | ICD-10-CM

## 2015-05-02 DIAGNOSIS — Z3403 Encounter for supervision of normal first pregnancy, third trimester: Secondary | ICD-10-CM

## 2015-05-02 DIAGNOSIS — Z23 Encounter for immunization: Secondary | ICD-10-CM

## 2015-05-02 LAB — CBC
HCT: 34.8 % — ABNORMAL LOW (ref 36.0–46.0)
Hemoglobin: 11.4 g/dL — ABNORMAL LOW (ref 12.0–15.0)
MCH: 28.7 pg (ref 26.0–34.0)
MCHC: 32.8 g/dL (ref 30.0–36.0)
MCV: 87.7 fL (ref 78.0–100.0)
MPV: 10.3 fL (ref 8.6–12.4)
PLATELETS: 304 10*3/uL (ref 150–400)
RBC: 3.97 MIL/uL (ref 3.87–5.11)
RDW: 12.6 % (ref 11.5–15.5)
WBC: 13.1 10*3/uL — ABNORMAL HIGH (ref 4.0–10.5)

## 2015-05-02 NOTE — Patient Instructions (Signed)
Third Trimester of Pregnancy The third trimester is from week 29 through week 42, months 7 through 9. The third trimester is a time when the fetus is growing rapidly. At the end of the ninth month, the fetus is about 20 inches in length and weighs 6-10 pounds.  BODY CHANGES Your body goes through many changes during pregnancy. The changes vary from woman to woman.   Your weight will continue to increase. You can expect to gain 25-35 pounds (11-16 kg) by the end of the pregnancy.  You may begin to get stretch marks on your hips, abdomen, and breasts.  You may urinate more often because the fetus is moving lower into your pelvis and pressing on your bladder.  You may develop or continue to have heartburn as a result of your pregnancy.  You may develop constipation because certain hormones are causing the muscles that push waste through your intestines to slow down.  You may develop hemorrhoids or swollen, bulging veins (varicose veins).  You may have pelvic pain because of the weight gain and pregnancy hormones relaxing your joints between the bones in your pelvis. Backaches may result from overexertion of the muscles supporting your posture.  You may have changes in your hair. These can include thickening of your hair, rapid growth, and changes in texture. Some women also have hair loss during or after pregnancy, or hair that feels dry or thin. Your hair will most likely return to normal after your baby is born.  Your breasts will continue to grow and be tender. A yellow discharge may leak from your breasts called colostrum.  Your belly button may stick out.  You may feel short of breath because of your expanding uterus.  You may notice the fetus "dropping," or moving lower in your abdomen.  You may have a bloody mucus discharge. This usually occurs a few days to a week before labor begins.  Your cervix becomes thin and soft (effaced) near your due date. WHAT TO EXPECT AT YOUR  PRENATAL EXAMS  You will have prenatal exams every 2 weeks until week 36. Then, you will have weekly prenatal exams. During a routine prenatal visit:  You will be weighed to make sure you and the fetus are growing normally.  Your blood pressure is taken.  Your abdomen will be measured to track your baby's growth.  The fetal heartbeat will be listened to.  Any test results from the previous visit will be discussed.  You may have a cervical check near your due date to see if you have effaced. At around 36 weeks, your caregiver will check your cervix. At the same time, your caregiver will also perform a test on the secretions of the vaginal tissue. This test is to determine if a type of bacteria, Group B streptococcus, is present. Your caregiver will explain this further. Your caregiver may ask you:  What your birth plan is.  How you are feeling.  If you are feeling the baby move.  If you have had any abnormal symptoms, such as leaking fluid, bleeding, severe headaches, or abdominal cramping.  If you are using any tobacco products, including cigarettes, chewing tobacco, and electronic cigarettes.  If you have any questions. Other tests or screenings that may be performed during your third trimester include:  Blood tests that check for low iron levels (anemia).  Fetal testing to check the health, activity level, and growth of the fetus. Testing is done if you have certain medical conditions or if   there are problems during the pregnancy.  HIV (human immunodeficiency virus) testing. If you are at high risk, you may be screened for HIV during your third trimester of pregnancy. FALSE LABOR You may feel small, irregular contractions that eventually go away. These are called Braxton Hicks contractions, or false labor. Contractions may last for hours, days, or even weeks before true labor sets in. If contractions come at regular intervals, intensify, or become painful, it is best to be seen  by your caregiver.  SIGNS OF LABOR   Menstrual-like cramps.  Contractions that are 5 minutes apart or less.  Contractions that start on the top of the uterus and spread down to the lower abdomen and back.  A sense of increased pelvic pressure or back pain.  A watery or bloody mucus discharge that comes from the vagina. If you have any of these signs before the 37th week of pregnancy, call your caregiver right away. You need to go to the hospital to get checked immediately. HOME CARE INSTRUCTIONS   Avoid all smoking, herbs, alcohol, and unprescribed drugs. These chemicals affect the formation and growth of the baby.  Do not use any tobacco products, including cigarettes, chewing tobacco, and electronic cigarettes. If you need help quitting, ask your health care provider. You may receive counseling support and other resources to help you quit.  Follow your caregiver's instructions regarding medicine use. There are medicines that are either safe or unsafe to take during pregnancy.  Exercise only as directed by your caregiver. Experiencing uterine cramps is a good sign to stop exercising.  Continue to eat regular, healthy meals.  Wear a good support bra for breast tenderness.  Do not use hot tubs, steam rooms, or saunas.  Wear your seat belt at all times when driving.  Avoid raw meat, uncooked cheese, cat litter boxes, and soil used by cats. These carry germs that can cause birth defects in the baby.  Take your prenatal vitamins.  Take 1500-2000 mg of calcium daily starting at the 20th week of pregnancy until you deliver your baby.  Try taking a stool softener (if your caregiver approves) if you develop constipation. Eat more high-fiber foods, such as fresh vegetables or fruit and whole grains. Drink plenty of fluids to keep your urine clear or pale yellow.  Take warm sitz baths to soothe any pain or discomfort caused by hemorrhoids. Use hemorrhoid cream if your caregiver  approves.  If you develop varicose veins, wear support hose. Elevate your feet for 15 minutes, 3-4 times a day. Limit salt in your diet.  Avoid heavy lifting, wear low heal shoes, and practice good posture.  Rest a lot with your legs elevated if you have leg cramps or low back pain.  Visit your dentist if you have not gone during your pregnancy. Use a soft toothbrush to brush your teeth and be gentle when you floss.  A sexual relationship may be continued unless your caregiver directs you otherwise.  Do not travel far distances unless it is absolutely necessary and only with the approval of your caregiver.  Take prenatal classes to understand, practice, and ask questions about the labor and delivery.  Make a trial run to the hospital.  Pack your hospital bag.  Prepare the baby's nursery.  Continue to go to all your prenatal visits as directed by your caregiver. SEEK MEDICAL CARE IF:  You are unsure if you are in labor or if your water has broken.  You have dizziness.  You have   mild pelvic cramps, pelvic pressure, or nagging pain in your abdominal area.  You have persistent nausea, vomiting, or diarrhea.  You have a bad smelling vaginal discharge.  You have pain with urination. SEEK IMMEDIATE MEDICAL CARE IF:   You have a fever.  You are leaking fluid from your vagina.  You have spotting or bleeding from your vagina.  You have severe abdominal cramping or pain.  You have rapid weight loss or gain.  You have shortness of breath with chest pain.  You notice sudden or extreme swelling of your face, hands, ankles, feet, or legs.  You have not felt your baby move in over an hour.  You have severe headaches that do not go away with medicine.  You have vision changes.   This information is not intended to replace advice given to you by your health care provider. Make sure you discuss any questions you have with your health care provider.   Document Released:  05/25/2001 Document Revised: 06/21/2014 Document Reviewed: 08/01/2012 Elsevier Interactive Patient Education 2016 [employer]  Contraception Choices Contraception (birth control) is the use of any methods or devices to prevent pregnancy. Below are some methods to help avoid pregnancy. HORMONAL METHODS   Contraceptive implant. This is a thin, plastic tube containing progesterone hormone. It does not contain estrogen hormone. Your health care provider inserts the tube in the inner part of the upper arm. The tube can remain in place for up to 3 years. After 3 years, the implant must be removed. The implant prevents the ovaries from releasing an egg (ovulation), thickens the cervical mucus to prevent sperm from entering the uterus, and thins the lining of the inside of the uterus.  Progesterone-only injections. These injections are given every 3 months by your health care provider to prevent pregnancy. This synthetic progesterone hormone stops the ovaries from releasing eggs. It also thickens cervical mucus and changes the uterine lining. This makes it harder for sperm to survive in the uterus.  Birth control pills. These pills contain estrogen and progesterone hormone. They work by preventing the ovaries from releasing eggs (ovulation). They also cause the cervical mucus to thicken, preventing the sperm from entering the uterus. Birth control pills are prescribed by a health care provider.Birth control pills can also be used to treat heavy periods.  Minipill. This type of birth control pill contains only the progesterone hormone. They are taken every day of each month and must be prescribed by your health care provider.  Birth control patch. The patch contains hormones similar to those in birth control pills. It must be changed once a week and is prescribed by a health care provider.  Vaginal ring. The ring contains hormones similar to those in birth control pills. It is left in the vagina for 3  weeks, removed for 1 week, and then a new one is put back in place. The patient must be comfortable inserting and removing the ring from the vagina.A health care provider's prescription is necessary.  Emergency contraception. Emergency contraceptives prevent pregnancy after unprotected sexual intercourse. This pill can be taken right after sex or up to 5 days after unprotected sex. It is most effective the sooner you take the pills after having sexual intercourse. Most emergency contraceptive pills are available without a prescription. Check with your pharmacist. Do not use emergency contraception as your only form of birth control. BARRIER METHODS   Female condom. This is a thin sheath (latex or rubber) that is worn over the penis   during sexual intercourse. It can be used with spermicide to increase effectiveness.  Female condom. This is a soft, loose-fitting sheath that is put into the vagina before sexual intercourse.  Diaphragm. This is a soft, latex, dome-shaped barrier that must be fitted by a health care provider. It is inserted into the vagina, along with a spermicidal jelly. It is inserted before intercourse. The diaphragm should be left in the vagina for 6 to 8 hours after intercourse.  Cervical cap. This is a round, soft, latex or plastic cup that fits over the cervix and must be fitted by a health care provider. The cap can be left in place for up to 48 hours after intercourse.  Sponge. This is a soft, circular piece of polyurethane foam. The sponge has spermicide in it. It is inserted into the vagina after wetting it and before sexual intercourse.  Spermicides. These are chemicals that kill or block sperm from entering the cervix and uterus. They come in the form of creams, jellies, suppositories, foam, or tablets. They do not require a prescription. They are inserted into the vagina with an applicator before having sexual intercourse. The process must be repeated every time you have  sexual intercourse. INTRAUTERINE CONTRACEPTION  Intrauterine device (IUD). This is a T-shaped device that is put in a woman's uterus during a menstrual period to prevent pregnancy. There are 2 types:  Copper IUD. This type of IUD is wrapped in copper wire and is placed inside the uterus. Copper makes the uterus and fallopian tubes produce a fluid that kills sperm. It can stay in place for 10 years.  Hormone IUD. This type of IUD contains the hormone progestin (synthetic progesterone). The hormone thickens the cervical mucus and prevents sperm from entering the uterus, and it also thins the uterine lining to prevent implantation of a fertilized egg. The hormone can weaken or kill the sperm that get into the uterus. It can stay in place for 3-5 years, depending on which type of IUD is used. PERMANENT METHODS OF CONTRACEPTION  Female tubal ligation. This is when the woman's fallopian tubes are surgically sealed, tied, or blocked to prevent the egg from traveling to the uterus.  Hysteroscopic sterilization. This involves placing a small coil or insert into each fallopian tube. Your doctor uses a technique called hysteroscopy to do the procedure. The device causes scar tissue to form. This results in permanent blockage of the fallopian tubes, so the sperm cannot fertilize the egg. It takes about 3 months after the procedure for the tubes to become blocked. You must use another form of birth control for these 3 months.  Female sterilization. This is when the female has the tubes that carry sperm tied off (vasectomy).This blocks sperm from entering the vagina during sexual intercourse. After the procedure, the man can still ejaculate fluid (semen). NATURAL PLANNING METHODS  Natural family planning. This is not having sexual intercourse or using a barrier method (condom, diaphragm, cervical cap) on days the woman could become pregnant.  Calendar method. This is keeping track of the length of each menstrual  cycle and identifying when you are fertile.  Ovulation method. This is avoiding sexual intercourse during ovulation.  Symptothermal method. This is avoiding sexual intercourse during ovulation, using a thermometer and ovulation symptoms.  Post-ovulation method. This is timing sexual intercourse after you have ovulated. Regardless of which type or method of contraception you choose, it is important that you use condoms to protect against the transmission of sexually transmitted infections (  STIs). Talk with your health care provider about which form of contraception is most appropriate for you.   This information is not intended to replace advice given to you by your health care provider. Make sure you discuss any questions you have with your health care provider.   Document Released: 05/31/2005 Document Revised: 06/05/2013 Document Reviewed: 11/23/2012 Elsevier Interactive Patient Education 2016 [employer]   Breastfeeding Deciding to breastfeed is one of the best choices you can make for you and your baby. A change in hormones during pregnancy causes your breast tissue to grow and increases the number and size of your milk ducts. These hormones also allow proteins, sugars, and fats from your blood supply to make breast milk in your milk-producing glands. Hormones prevent breast milk from being released before your baby is born as well as prompt milk flow after birth. Once breastfeeding has begun, thoughts of your baby, as well as his or her sucking or crying, can stimulate the release of milk from your milk-producing glands.  BENEFITS OF BREASTFEEDING For Your Baby  Your first milk (colostrum) helps your baby's digestive system function better.  There are antibodies in your milk that help your baby fight off infections.  Your baby has a lower incidence of asthma, allergies, and sudden infant death syndrome.  The nutrients in breast milk are better for your baby than infant formulas and  are designed uniquely for your baby's needs.  Breast milk improves your baby's brain development.  Your baby is less likely to develop other conditions, such as childhood obesity, asthma, or type 2 diabetes mellitus. For You  Breastfeeding helps to create a very special bond between you and your baby.  Breastfeeding is convenient. Breast milk is always available at the correct temperature and costs nothing.  Breastfeeding helps to burn calories and helps you lose the weight gained during pregnancy.  Breastfeeding makes your uterus contract to its prepregnancy size faster and slows bleeding (lochia) after you give birth.   Breastfeeding helps to lower your risk of developing type 2 diabetes mellitus, osteoporosis, and breast or ovarian cancer later in life. SIGNS THAT YOUR BABY IS HUNGRY Early Signs of Hunger  Increased alertness or activity.  Stretching.  Movement of the head from side to side.  Movement of the head and opening of the mouth when the corner of the mouth or cheek is stroked (rooting).  Increased sucking sounds, smacking lips, cooing, sighing, or squeaking.  Hand-to-mouth movements.  Increased sucking of fingers or hands. Late Signs of Hunger  Fussing.  Intermittent crying. Extreme Signs of Hunger Signs of extreme hunger will require calming and consoling before your baby will be able to breastfeed successfully. Do not wait for the following signs of extreme hunger to occur before you initiate breastfeeding:  Restlessness.  A loud, strong cry.  Screaming. BREASTFEEDING BASICS Breastfeeding Initiation  Find a comfortable place to sit or lie down, with your neck and back well supported.  Place a pillow or rolled up blanket under your baby to bring him or her to the level of your breast (if you are seated). Nursing pillows are specially designed to help support your arms and your baby while you breastfeed.  Make sure that your baby's abdomen is facing  your abdomen.  Gently massage your breast. With your fingertips, massage from your chest wall toward your nipple in a circular motion. This encourages milk flow. You may need to continue this action during the feeding if your milk flows slowly.    Support your breast with 4 fingers underneath and your thumb above your nipple. Make sure your fingers are well away from your nipple and your baby's mouth.  Stroke your baby's lips gently with your finger or nipple.  When your baby's mouth is open wide enough, quickly bring your baby to your breast, placing your entire nipple and as much of the colored area around your nipple (areola) as possible into your baby's mouth.  More areola should be visible above your baby's upper lip than below the lower lip.  Your baby's tongue should be between his or her lower gum and your breast.  Ensure that your baby's mouth is correctly positioned around your nipple (latched). Your baby's lips should create a seal on your breast and be turned out (everted).  It is common for your baby to suck about 2-3 minutes in order to start the flow of breast milk. Latching Teaching your baby how to latch on to your breast properly is very important. An improper latch can cause nipple pain and decreased milk supply for you and poor weight gain in your baby. Also, if your baby is not latched onto your nipple properly, he or she may swallow some air during feeding. This can make your baby fussy. Burping your baby when you switch breasts during the feeding can help to get rid of the air. However, teaching your baby to latch on properly is still the best way to prevent fussiness from swallowing air while breastfeeding. Signs that your baby has successfully latched on to your nipple:  Silent tugging or silent sucking, without causing you pain.  Swallowing heard between every 3-4 sucks.  Muscle movement above and in front of his or her ears while sucking. Signs that your baby has  not successfully latched on to nipple:  Sucking sounds or smacking sounds from your baby while breastfeeding.  Nipple pain. If you think your baby has not latched on correctly, slip your finger into the corner of your baby's mouth to break the suction and place it between your baby's gums. Attempt breastfeeding initiation again. Signs of Successful Breastfeeding Signs from your baby:  A gradual decrease in the number of sucks or complete cessation of sucking.  Falling asleep.  Relaxation of his or her body.  Retention of a small amount of milk in his or her mouth.  Letting go of your breast by himself or herself. Signs from you:  Breasts that have increased in firmness, weight, and size 1-3 hours after feeding.  Breasts that are softer immediately after breastfeeding.  Increased milk volume, as well as a change in milk consistency and color by the fifth day of breastfeeding.  Nipples that are not sore, cracked, or bleeding. Signs That Your Baby is Getting Enough Milk  Wetting at least 3 diapers in a 24-hour period. The urine should be clear and pale yellow by age 5 days.  At least 3 stools in a 24-hour period by age 5 days. The stool should be soft and yellow.  At least 3 stools in a 24-hour period by age 7 days. The stool should be seedy and yellow.  No loss of weight greater than 10% of birth weight during the first 3 days of age.  Average weight gain of 4-7 ounces (113-198 g) per week after age 4 days.  Consistent daily weight gain by age 5 days, without weight loss after the age of 2 weeks. After a feeding, your baby may spit up a small amount. This   is common. BREASTFEEDING FREQUENCY AND DURATION Frequent feeding will help you make more milk and can prevent sore nipples and breast engorgement. Breastfeed when you feel the need to reduce the fullness of your breasts or when your baby shows signs of hunger. This is called "breastfeeding on demand." Avoid introducing a  pacifier to your baby while you are working to establish breastfeeding (the first 4-6 weeks after your baby is born). After this time you may choose to use a pacifier. Research has shown that pacifier use during the first year of a baby's life decreases the risk of sudden infant death syndrome (SIDS). Allow your baby to feed on each breast as long as he or she wants. Breastfeed until your baby is finished feeding. When your baby unlatches or falls asleep while feeding from the first breast, offer the second breast. Because newborns are often sleepy in the first few weeks of life, you may need to awaken your baby to get him or her to feed. Breastfeeding times will vary from baby to baby. However, the following rules can serve as a guide to help you ensure that your baby is properly fed:  Newborns (babies 4 weeks of age or younger) may breastfeed every 1-3 hours.  Newborns should not go longer than 3 hours during the day or 5 hours during the night without breastfeeding.  You should breastfeed your baby a minimum of 8 times in a 24-hour period until you begin to introduce solid foods to your baby at around 6 months of age. BREAST MILK PUMPING Pumping and storing breast milk allows you to ensure that your baby is exclusively fed your breast milk, even at times when you are unable to breastfeed. This is especially important if you are going back to work while you are still breastfeeding or when you are not able to be present during feedings. Your lactation consultant can give you guidelines on how long it is safe to store breast milk. A breast pump is a machine that allows you to pump milk from your breast into a sterile bottle. The pumped breast milk can then be stored in a refrigerator or freezer. Some breast pumps are operated by hand, while others use electricity. Ask your lactation consultant which type will work best for you. Breast pumps can be purchased, but some hospitals and breastfeeding support  groups lease breast pumps on a monthly basis. A lactation consultant can teach you how to hand express breast milk, if you prefer not to use a pump. CARING FOR YOUR BREASTS WHILE YOU BREASTFEED Nipples can become dry, cracked, and sore while breastfeeding. The following recommendations can help keep your breasts moisturized and healthy:  Avoid using soap on your nipples.  Wear a supportive bra. Although not required, special nursing bras and tank tops are designed to allow access to your breasts for breastfeeding without taking off your entire bra or top. Avoid wearing underwire-style bras or extremely tight bras.  Air dry your nipples for 3-4minutes after each feeding.  Use only cotton bra pads to absorb leaked breast milk. Leaking of breast milk between feedings is normal.  Use lanolin on your nipples after breastfeeding. Lanolin helps to maintain your skin's normal moisture barrier. If you use pure lanolin, you do not need to wash it off before feeding your baby again. Pure lanolin is not toxic to your baby. You may also hand express a few drops of breast milk and gently massage that milk into your nipples and allow the milk   to air dry. In the first few weeks after giving birth, some women experience extremely full breasts (engorgement). Engorgement can make your breasts feel heavy, warm, and tender to the touch. Engorgement peaks within 3-5 days after you give birth. The following recommendations can help ease engorgement:  Completely empty your breasts while breastfeeding or pumping. You may want to start by applying warm, moist heat (in the shower or with warm water-soaked hand towels) just before feeding or pumping. This increases circulation and helps the milk flow. If your baby does not completely empty your breasts while breastfeeding, pump any extra milk after he or she is finished.  Wear a snug bra (nursing or regular) or tank top for 1-2 days to signal your body to slightly decrease  milk production.  Apply ice packs to your breasts, unless this is too uncomfortable for you.  Make sure that your baby is latched on and positioned properly while breastfeeding. If engorgement persists after 48 hours of following these recommendations, contact your health care provider or a lactation consultant. OVERALL HEALTH CARE RECOMMENDATIONS WHILE BREASTFEEDING  Eat healthy foods. Alternate between meals and snacks, eating 3 of each per day. Because what you eat affects your breast milk, some of the foods may make your baby more irritable than usual. Avoid eating these foods if you are sure that they are negatively affecting your baby.  Drink milk, fruit juice, and water to satisfy your thirst (about 10 glasses a day).  Rest often, relax, and continue to take your prenatal vitamins to prevent fatigue, stress, and anemia.  Continue breast self-awareness checks.  Avoid chewing and smoking tobacco. Chemicals from cigarettes that pass into breast milk and exposure to secondhand smoke may harm your baby.  Avoid alcohol and drug use, including marijuana. Some medicines that may be harmful to your baby can pass through breast milk. It is important to ask your health care provider before taking any medicine, including all over-the-counter and prescription medicine as well as vitamin and herbal supplements. It is possible to become pregnant while breastfeeding. If birth control is desired, ask your health care provider about options that will be safe for your baby. SEEK MEDICAL CARE IF:  You feel like you want to stop breastfeeding or have become frustrated with breastfeeding.  You have painful breasts or nipples.  Your nipples are cracked or bleeding.  Your breasts are red, tender, or warm.  You have a swollen area on either breast.  You have a fever or chills.  You have nausea or vomiting.  You have drainage other than breast milk from your nipples.  Your breasts do not become  full before feedings by the fifth day after you give birth.  You feel sad and depressed.  Your baby is too sleepy to eat well.  Your baby is having trouble sleeping.   Your baby is wetting less than 3 diapers in a 24-hour period.  Your baby has less than 3 stools in a 24-hour period.  Your baby's skin or the white part of his or her eyes becomes yellow.   Your baby is not gaining weight by 5 days of age. SEEK IMMEDIATE MEDICAL CARE IF:  Your baby is overly tired (lethargic) and does not want to wake up and feed.  Your baby develops an unexplained fever.   This information is not intended to replace advice given to you by your health care provider. Make sure you discuss any questions you have with your health care provider.     Document Released: 05/31/2005 Document Revised: 02/19/2015 Document Reviewed: 11/22/2012 Elsevier Interactive Patient Education 2016 [employer]  

## 2015-05-02 NOTE — Progress Notes (Signed)
Subjective:  Yvonne Hood is a 19 y.o. G1P0000 at 1158w2d being seen today for ongoing prenatal care.  Patient reports no complaints.  Contractions: Not present.  Vag. Bleeding: None. Movement: Present. Denies leaking of fluid.   The following portions of the patient's history were reviewed and updated as appropriate: allergies, current medications, past family history, past medical history, past social history, past surgical history and problem list. Problem list updated.  Objective:   Filed Vitals:   05/02/15 0920  BP: 111/77  Pulse: 105  Weight: 145 lb (65.772 kg)    Fetal Status: Fetal Heart Rate (bpm): 140   Movement: Present     General:  Alert, oriented and cooperative. Patient is in no acute distress.  Skin: Skin is warm and dry. No rash noted.   Cardiovascular: Normal heart rate noted  Respiratory: Normal respiratory effort, no problems with respiration noted  Abdomen: Soft, gravid, appropriate for gestational age. Pain/Pressure: Absent     Pelvic: Vag. Bleeding: None Vag D/C Character: Thin   Cervical exam deferred        Extremities: Normal range of motion.  Edema: Trace  Mental Status: Normal mood and affect. Normal behavior. Normal judgment and thought content.   Urinalysis: Urine Protein: Negative Urine Glucose: Negative  Assessment and Plan:  Pregnancy: G1P0000 at 3558w2d  1. Supervision of normal pregnancy in third trimester Continue routine prenatal care. 28 wk labs today - Glucose Tolerance, 1 HR (50g) - CBC - RPR - HIV antibody - Tdap vaccine greater than or equal to 7yo IM   Preterm labor symptoms and general obstetric precautions including but not limited to vaginal bleeding, contractions, leaking of fluid and fetal movement were reviewed in detail with the patient. Please refer to After Visit Summary for other counseling recommendations.  Return in 2 weeks (on 05/16/2015).   Reva Boresanya S Sutton Plake, MD

## 2015-05-03 LAB — GLUCOSE TOLERANCE, 1 HOUR (50G) W/O FASTING: Glucose, 1 Hour GTT: 116 mg/dL (ref 70–140)

## 2015-05-03 LAB — HIV ANTIBODY (ROUTINE TESTING W REFLEX): HIV 1&2 Ab, 4th Generation: NONREACTIVE

## 2015-05-03 LAB — RPR

## 2015-05-15 ENCOUNTER — Ambulatory Visit (INDEPENDENT_AMBULATORY_CARE_PROVIDER_SITE_OTHER): Payer: Medicaid Other | Admitting: Obstetrics and Gynecology

## 2015-05-15 ENCOUNTER — Encounter: Payer: Self-pay | Admitting: Obstetrics and Gynecology

## 2015-05-15 VITALS — BP 119/80 | HR 94 | Wt 146.0 lb

## 2015-05-15 DIAGNOSIS — Z283 Underimmunization status: Secondary | ICD-10-CM

## 2015-05-15 DIAGNOSIS — O9989 Other specified diseases and conditions complicating pregnancy, childbirth and the puerperium: Secondary | ICD-10-CM

## 2015-05-15 DIAGNOSIS — O09899 Supervision of other high risk pregnancies, unspecified trimester: Secondary | ICD-10-CM

## 2015-05-15 DIAGNOSIS — Z3403 Encounter for supervision of normal first pregnancy, third trimester: Secondary | ICD-10-CM

## 2015-05-15 NOTE — Progress Notes (Signed)
Subjective:  Yvonne Hood is a 19 y.o. G1P0000 at 54w1dbeing seen today for ongoing prenatal care.  She is currently monitored for the following issues for this high-risk pregnancy and has Adolescent dysmenorrhea; Migraine without aura; Supervision of normal first pregnancy, antepartum; Rubella non-immune status, antepartum; and Headache, rebound on her problem list.  Patient reports heartburn.  Contractions: Not present. Vag. Bleeding: None.  Movement: Present. Denies leaking of fluid.   The following portions of the patient's history were reviewed and updated as appropriate: allergies, current medications, past family history, past medical history, past social history, past surgical history and problem list. Problem list updated.  Objective:   Filed Vitals:   05/15/15 0950  BP: 119/80  Pulse: 94  Weight: 146 lb (66.225 kg)    Fetal Status: Fetal Heart Rate (bpm): 143   Movement: Present     General:  Alert, oriented and cooperative. Patient is in no acute distress.  Skin: Skin is warm and dry. No rash noted.   Cardiovascular: Normal heart rate noted  Respiratory: Normal respiratory effort, no problems with respiration noted  Abdomen: Soft, gravid, appropriate for gestational age. Pain/Pressure: Absent     Pelvic: Vag. Bleeding: None Vag D/C Character: Thin   Cervical exam deferred        Extremities: Normal range of motion.  Edema: Trace  Mental Status: Normal mood and affect. Normal behavior. Normal judgment and thought content.   Urinalysis: Urine Protein: Negative Urine Glucose: Negative  Assessment and Plan:  Pregnancy: G1P0000 at 359w1d1. Supervision of normal first pregnancy, antepartum, third trimester Patient reports some acid reflux and has not taken anything for it. Suggested Tums and Rx for pepcid e-prescribed if Tums are not sufficient  2. Rubella non-immune status, antepartum Will need MMR postpartum  Preterm labor symptoms and general obstetric  precautions including but not limited to vaginal bleeding, contractions, leaking of fluid and fetal movement were reviewed in detail with the patient. Please refer to After Visit Summary for other counseling recommendations.  Return in about 2 weeks (around 05/29/2015).   PeMora BellmanMD

## 2015-05-29 ENCOUNTER — Ambulatory Visit (INDEPENDENT_AMBULATORY_CARE_PROVIDER_SITE_OTHER): Payer: Medicaid Other | Admitting: Obstetrics and Gynecology

## 2015-05-29 ENCOUNTER — Encounter: Payer: Self-pay | Admitting: Obstetrics and Gynecology

## 2015-05-29 VITALS — BP 117/81 | HR 102 | Wt 147.0 lb

## 2015-05-29 DIAGNOSIS — Z283 Underimmunization status: Secondary | ICD-10-CM

## 2015-05-29 DIAGNOSIS — Z3403 Encounter for supervision of normal first pregnancy, third trimester: Secondary | ICD-10-CM

## 2015-05-29 DIAGNOSIS — O9989 Other specified diseases and conditions complicating pregnancy, childbirth and the puerperium: Secondary | ICD-10-CM

## 2015-05-29 DIAGNOSIS — O09899 Supervision of other high risk pregnancies, unspecified trimester: Secondary | ICD-10-CM

## 2015-05-29 NOTE — Progress Notes (Signed)
Subjective:  Yvonne Hood is a 19 y.o. G1P0000 at 6021w1d being seen today for ongoing prenatal care.  She is currently monitored for the following issues for this high-risk pregnancy and has Adolescent dysmenorrhea; Migraine without aura; Supervision of normal first pregnancy, antepartum; Rubella non-immune status, antepartum; and Headache, rebound on her problem list.  Patient reports no complaints.  Contractions: Irregular. Vag. Bleeding: None.  Movement: Present. Denies leaking of fluid.   The following portions of the patient's history were reviewed and updated as appropriate: allergies, current medications, past family history, past medical history, past social history, past surgical history and problem list. Problem list updated.  Objective:   Filed Vitals:   05/29/15 0958  BP: 117/81  Pulse: 102  Weight: 147 lb (66.679 kg)    Fetal Status: Fetal Heart Rate (bpm): 140   Movement: Present     General:  Alert, oriented and cooperative. Patient is in no acute distress.  Skin: Skin is warm and dry. No rash noted.   Cardiovascular: Normal heart rate noted  Respiratory: Normal respiratory effort, no problems with respiration noted  Abdomen: Soft, gravid, appropriate for gestational age. Pain/Pressure: Present     Pelvic: Vag. Bleeding: None Vag D/C Character: Thin   Cervical exam deferred        Extremities: Normal range of motion.  Edema: Trace  Mental Status: Normal mood and affect. Normal behavior. Normal judgment and thought content.   Urinalysis: Urine Protein: Negative Urine Glucose: Negative  Assessment and Plan:  Pregnancy: G1P0000 at 521w1d  1. Supervision of normal first pregnancy, antepartum, third trimester Patient is doing well without complaints  2. Rubella non-immune status, antepartum Will need pp   Preterm labor symptoms and general obstetric precautions including but not limited to vaginal bleeding, contractions, leaking of fluid and fetal movement were  reviewed in detail with the patient. Please refer to After Visit Summary for other counseling recommendations.  Return in about 2 weeks (around 06/12/2015).   Catalina AntiguaPeggy Momoko Slezak, MD

## 2015-06-12 ENCOUNTER — Ambulatory Visit (INDEPENDENT_AMBULATORY_CARE_PROVIDER_SITE_OTHER): Payer: Medicaid Other | Admitting: Obstetrics & Gynecology

## 2015-06-12 ENCOUNTER — Encounter: Payer: Self-pay | Admitting: Obstetrics & Gynecology

## 2015-06-12 VITALS — BP 131/82 | HR 108 | Wt 151.0 lb

## 2015-06-12 DIAGNOSIS — Z3403 Encounter for supervision of normal first pregnancy, third trimester: Secondary | ICD-10-CM

## 2015-06-12 NOTE — Progress Notes (Signed)
Subjective:  Yvonne Hood is a 19 y.o. SW 161P0000 (son)  at 66100w1d being seen today for ongoing prenatal care.  She is currently monitored for the following issues for this low-risk pregnancy and has Adolescent dysmenorrhea; Migraine without aura; Supervision of normal first pregnancy, antepartum; Rubella non-immune status, antepartum; and Headache, rebound on her problem list.  Patient reports no complaints except that her shifts at [employer] are too long and hard. She can have a note limiting her shifts to 6 hours.  Contractions: Irregular. Vag. Bleeding: None.  Movement: Present. Denies leaking of fluid.   The following portions of the patient's history were reviewed and updated as appropriate: allergies, current medications, past family history, past medical history, past social history, past surgical history and problem list. Problem list updated.  Objective:   Filed Vitals:   06/12/15 0842  BP: 131/82  Pulse: 108  Weight: 151 lb (68.493 kg)    Fetal Status: Fetal Heart Rate (bpm): 154   Movement: Present     General:  Alert, oriented and cooperative. Patient is in no acute distress.  Skin: Skin is warm and dry. No rash noted.   Cardiovascular: Normal heart rate noted  Respiratory: Normal respiratory effort, no problems with respiration noted  Abdomen: Soft, gravid, appropriate for gestational age. Pain/Pressure: Present     Pelvic: Vag. Bleeding: None Vag D/C Character: Thin   Cervical exam deferred        Extremities: Normal range of motion.  Edema: Trace  Mental Status: Normal mood and affect. Normal behavior. Normal judgment and thought content.   Urinalysis: Urine Protein: Negative Urine Glucose: Negative  Assessment and Plan:  Pregnancy: G1P0000 at 54100w1d  1. Supervision of normal first pregnancy, antepartum, third trimester   Preterm labor symptoms and general obstetric precautions including but not limited to vaginal bleeding, contractions, leaking of fluid and  fetal movement were reviewed in detail with the patient. Please refer to After Visit Summary for other counseling recommendations.   Return in about 1 week (around 06/19/2015) for cervical cultures.   Yvonne BossierMyra C Harris Kistler, MD

## 2015-06-15 NOTE — L&D Delivery Note (Signed)
Patient is 20 y.o. G1P0000 [redacted]w[redacted]d admitted for SOL. New diagnosis of GHTN on admission. BPs have been stable. Diagnosed with Triple I during labor, and was started on Amp and [place]. Amnioinfusion done due to recurrent variable decelerations during labor.    Delivery Note At 8:50 PM a viable female was delivered via Vaginal, Spontaneous Delivery (Presentation: Left Occiput Anterior).  APGAR: 6, 7; weight: 3410.   Placenta status: Intact, Spontaneous.  Cord:  with the following complications: None.  Cord pH: not obtained. Placenta sent to pathology due to Triple I. Noted meconium stained fluid.   Anesthesia: Epidural  Episiotomy:  none Lacerations:  1st degree perineal  Suture Repair: 3.0 vicryl Est. Blood Loss (mL):  250  Mom to postpartum. Due to diagnosis of Triple I, will continue Amp and Gent 24 hrs postpartum. Placenta sent to pathology. Baby to NICU.  Palma Holter 07/07/2015, 9:18 PM   Upon arrival patient was complete and pushing. She pushed with good maternal effort to deliver a healthy baby boy. Baby delivered without difficulty, was noted to have good tone and place on maternal abdomen for oral suctioning, drying and stimulation. Delayed cord clamping performed. Placenta delivered intact with 3V cord. Vaginal canal and perineum was inspected and 1st degree perinea laceration was repaired; hemostatic. Pitocin was started and uterus massaged until bleeding slowed. Counts of sharps, instruments, and lap pads were all correct.   Palma Holter, MD PGY 1 Family Medicine

## 2015-06-19 ENCOUNTER — Other Ambulatory Visit (HOSPITAL_COMMUNITY)
Admission: RE | Admit: 2015-06-19 | Discharge: 2015-06-19 | Disposition: A | Discharge status: Home / Self Care | Payer: Medicaid Other | Source: Ambulatory Visit | Attending: Obstetrics and Gynecology | Admitting: Obstetrics and Gynecology

## 2015-06-19 ENCOUNTER — Encounter: Payer: Self-pay | Admitting: Obstetrics and Gynecology

## 2015-06-19 ENCOUNTER — Ambulatory Visit (INDEPENDENT_AMBULATORY_CARE_PROVIDER_SITE_OTHER): Payer: Medicaid Other | Admitting: Obstetrics and Gynecology

## 2015-06-19 VITALS — BP 127/86 | HR 82 | Wt 151.0 lb

## 2015-06-19 DIAGNOSIS — Z283 Underimmunization status: Secondary | ICD-10-CM

## 2015-06-19 DIAGNOSIS — Z113 Encounter for screening for infections with a predominantly sexual mode of transmission: Secondary | ICD-10-CM | POA: Diagnosis present

## 2015-06-19 DIAGNOSIS — Z36 Encounter for antenatal screening of mother: Secondary | ICD-10-CM | POA: Diagnosis not present

## 2015-06-19 DIAGNOSIS — O09899 Supervision of other high risk pregnancies, unspecified trimester: Secondary | ICD-10-CM

## 2015-06-19 DIAGNOSIS — Z3403 Encounter for supervision of normal first pregnancy, third trimester: Secondary | ICD-10-CM

## 2015-06-19 DIAGNOSIS — O9989 Other specified diseases and conditions complicating pregnancy, childbirth and the puerperium: Secondary | ICD-10-CM

## 2015-06-19 LAB — OB RESULTS CONSOLE GBS: STREP GROUP B AG: NEGATIVE

## 2015-06-19 NOTE — Progress Notes (Signed)
Subjective:  Yvonne Hood is a 20 y.o. G1P0000 at 1452w1d being seen today for ongoing prenatal care.  She is currently monitored for the following issues for this low-risk pregnancy and has Adolescent dysmenorrhea; Migraine without aura; Supervision of normal first pregnancy, antepartum; Rubella non-immune status, antepartum; and Headache, rebound on her problem list.  Patient reports no complaints.  Contractions: Irregular. Vag. Bleeding: None.  Movement: Present. Denies leaking of fluid.   The following portions of the patient's history were reviewed and updated as appropriate: allergies, current medications, past family history, past medical history, past social history, past surgical history and problem list. Problem list updated.  Objective:   Filed Vitals:   06/19/15 0955  BP: 127/86  Pulse: 82  Weight: 151 lb (68.493 kg)    Fetal Status: Fetal Heart Rate (bpm): 126   Movement: Present     General:  Alert, oriented and cooperative. Patient is in no acute distress.  Skin: Skin is warm and dry. No rash noted.   Cardiovascular: Normal heart rate noted  Respiratory: Normal respiratory effort, no problems with respiration noted  Abdomen: Soft, gravid, appropriate for gestational age. Pain/Pressure: Present     Pelvic: Vag. Bleeding: None Vag D/C Character: Thin   Cervical exam performed      1/ 30/ high  Extremities: Normal range of motion.  Edema: Trace  Mental Status: Normal mood and affect. Normal behavior. Normal judgment and thought content.   Urinalysis:      Assessment and Plan:  Pregnancy: G1P0000 at 3952w1d  1. Supervision of normal first pregnancy, antepartum, third trimester Patient is doing well Cultures collected  2. Rubella non-immune status, antepartum Will need vaccination postpartum  Preterm labor symptoms and general obstetric precautions including but not limited to vaginal bleeding, contractions, leaking of fluid and fetal movement were reviewed in  detail with the patient. Please refer to After Visit Summary for other counseling recommendations.  Return in about 1 week (around 06/26/2015).   Catalina AntiguaPeggy Noel Henandez, MD

## 2015-06-20 LAB — GC/CHLAMYDIA PROBE AMP (~~LOC~~) NOT AT ARMC
CHLAMYDIA, DNA PROBE: NEGATIVE
Neisseria Gonorrhea: NEGATIVE

## 2015-06-21 LAB — CULTURE, BETA STREP (GROUP B ONLY)

## 2015-06-27 ENCOUNTER — Ambulatory Visit (INDEPENDENT_AMBULATORY_CARE_PROVIDER_SITE_OTHER): Payer: Medicaid Other | Admitting: Family Medicine

## 2015-06-27 VITALS — BP 136/81 | HR 85 | Wt 155.0 lb

## 2015-06-27 DIAGNOSIS — Z3403 Encounter for supervision of normal first pregnancy, third trimester: Secondary | ICD-10-CM

## 2015-06-27 NOTE — Progress Notes (Signed)
Subjective:  Yvonne Hood is a 20 y.o. G1P0000 at 7367w2d being seen today for ongoing prenatal care.  She is currently monitored for the following issues for this low-risk pregnancy and has Adolescent dysmenorrhea; Migraine without aura; Supervision of normal first pregnancy, antepartum; Rubella non-immune status, antepartum; and Headache, rebound on her problem list.  Patient reports no complaints.  Contractions: Irregular. Vag. Bleeding: None.  Movement: Present. Denies leaking of fluid.   The following portions of the patient's history were reviewed and updated as appropriate: allergies, current medications, past family history, past medical history, past social history, past surgical history and problem list. Problem list updated.  Objective:   Filed Vitals:   06/27/15 0942  BP: 136/81  Pulse: 85  Weight: 155 lb (70.308 kg)    Fetal Status: Fetal Heart Rate (bpm): 138 Fundal Height: 32 cm Movement: Present  Presentation: Vertex  General:  Alert, oriented and cooperative. Patient is in no acute distress.  Skin: Skin is warm and dry. No rash noted.   Cardiovascular: Normal heart rate noted  Respiratory: Normal respiratory effort, no problems with respiration noted  Abdomen: Soft, gravid, appropriate for gestational age. Pain/Pressure: Present     Pelvic: Vag. Bleeding: None Vag D/C Character: Thin   Cervical exam performed Dilation: 1.5 Effacement (%): 50 Station: -2  Extremities: Normal range of motion.  Edema: Trace  Mental Status: Normal mood and affect. Normal behavior. Normal judgment and thought content.   Urinalysis: Urine Protein: Negative Urine Glucose: Negative  Assessment and Plan:  Pregnancy: G1P0000 at 7467w2d  1. Supervision of normal first pregnancy, antepartum, third trimester Continue routine prenatal care.   Term labor symptoms and general obstetric precautions including but not limited to vaginal bleeding, contractions, leaking of fluid and fetal movement  were reviewed in detail with the patient. Please refer to After Visit Summary for other counseling recommendations.  Return in 1 week (on 07/04/2015).   Reva Boresanya S Pratt, MD

## 2015-06-27 NOTE — Patient Instructions (Addendum)
Breastfeeding Deciding to breastfeed is one of the best choices you can make for you and your baby. A change in hormones during pregnancy causes your breast tissue to grow and increases the number and size of your milk ducts. These hormones also allow proteins, sugars, and fats from your blood supply to make breast milk in your milk-producing glands. Hormones prevent breast milk from being released before your baby is born as well as prompt milk flow after birth. Once breastfeeding has begun, thoughts of your baby, as well as his or her sucking or crying, can stimulate the release of milk from your milk-producing glands.  BENEFITS OF BREASTFEEDING For Your Baby  Your first milk (colostrum) helps your baby's digestive system function better.  There are antibodies in your milk that help your baby fight off infections.  Your baby has a lower incidence of asthma, allergies, and sudden infant death syndrome.  The nutrients in breast milk are better for your baby than infant formulas and are designed uniquely for your baby's needs.  Breast milk improves your baby's brain development.  Your baby is less likely to develop other conditions, such as childhood obesity, asthma, or type 2 diabetes mellitus. For You  Breastfeeding helps to create a very special bond between you and your baby.  Breastfeeding is convenient. Breast milk is always available at the correct temperature and costs nothing.  Breastfeeding helps to burn calories and helps you lose the weight gained during pregnancy.  Breastfeeding makes your uterus contract to its prepregnancy size faster and slows bleeding (lochia) after you give birth.   Breastfeeding helps to lower your risk of developing type 2 diabetes mellitus, osteoporosis, and breast or ovarian cancer later in life. SIGNS THAT YOUR BABY IS HUNGRY Early Signs of Hunger  Increased alertness or activity.  Stretching.  Movement of the head from side to  side.  Movement of the head and opening of the mouth when the corner of the mouth or cheek is stroked (rooting).  Increased sucking sounds, smacking lips, cooing, sighing, or squeaking.  Hand-to-mouth movements.  Increased sucking of fingers or hands. Late Signs of Hunger  Fussing.  Intermittent crying. Extreme Signs of Hunger Signs of extreme hunger will require calming and consoling before your baby will be able to breastfeed successfully. Do not wait for the following signs of extreme hunger to occur before you initiate breastfeeding:  Restlessness.  A loud, strong cry.  Screaming. BREASTFEEDING BASICS Breastfeeding Initiation  Find a comfortable place to sit or lie down, with your neck and back well supported.  Place a pillow or rolled up blanket under your baby to bring him or her to the level of your breast (if you are seated). Nursing pillows are specially designed to help support your arms and your baby while you breastfeed.  Make sure that your baby's abdomen is facing your abdomen.  Gently massage your breast. With your fingertips, massage from your chest wall toward your nipple in a circular motion. This encourages milk flow. You may need to continue this action during the feeding if your milk flows slowly.  Support your breast with 4 fingers underneath and your thumb above your nipple. Make sure your fingers are well away from your nipple and your baby's mouth.  Stroke your baby's lips gently with your finger or nipple.  When your baby's mouth is open wide enough, quickly bring your baby to your breast, placing your entire nipple and as much of the colored area around your nipple (  areola) as possible into your baby's mouth.  More areola should be visible above your baby's upper lip than below the lower lip.  Your baby's tongue should be between his or her lower gum and your breast.  Ensure that your baby's mouth is correctly positioned around your nipple  (latched). Your baby's lips should create a seal on your breast and be turned out (everted).  It is common for your baby to suck about 2-3 minutes in order to start the flow of breast milk. Latching Teaching your baby how to latch on to your breast properly is very important. An improper latch can cause nipple pain and decreased milk supply for you and poor weight gain in your baby. Also, if your baby is not latched onto your nipple properly, he or she may swallow some air during feeding. This can make your baby fussy. Burping your baby when you switch breasts during the feeding can help to get rid of the air. However, teaching your baby to latch on properly is still the best way to prevent fussiness from swallowing air while breastfeeding. Signs that your baby has successfully latched on to your nipple:  Silent tugging or silent sucking, without causing you pain.  Swallowing heard between every 3-4 sucks.  Muscle movement above and in front of his or her ears while sucking. Signs that your baby has not successfully latched on to nipple:  Sucking sounds or smacking sounds from your baby while breastfeeding.  Nipple pain. If you think your baby has not latched on correctly, slip your finger into the corner of your baby's mouth to break the suction and place it between your baby's gums. Attempt breastfeeding initiation again. Signs of Successful Breastfeeding Signs from your baby:  A gradual decrease in the number of sucks or complete cessation of sucking.  Falling asleep.  Relaxation of his or her body.  Retention of a small amount of milk in his or her mouth.  Letting go of your breast by himself or herself. Signs from you:  Breasts that have increased in firmness, weight, and size 1-3 hours after feeding.  Breasts that are softer immediately after breastfeeding.  Increased milk volume, as well as a change in milk consistency and color by the fifth day of breastfeeding.  Nipples  that are not sore, cracked, or bleeding. Signs That Your Baby is Getting Enough Milk  Wetting at least 3 diapers in a 24-hour period. The urine should be clear and pale yellow by age 5 days.  At least 3 stools in a 24-hour period by age 5 days. The stool should be soft and yellow.  At least 3 stools in a 24-hour period by age 7 days. The stool should be seedy and yellow.  No loss of weight greater than 10% of birth weight during the first 3 days of age.  Average weight gain of 4-7 ounces (113-198 g) per week after age 4 days.  Consistent daily weight gain by age 5 days, without weight loss after the age of 2 weeks. After a feeding, your baby may spit up a small amount. This is common. BREASTFEEDING FREQUENCY AND DURATION Frequent feeding will help you make more milk and can prevent sore nipples and breast engorgement. Breastfeed when you feel the need to reduce the fullness of your breasts or when your baby shows signs of hunger. This is called "breastfeeding on demand." Avoid introducing a pacifier to your baby while you are working to establish breastfeeding (the first 4-6 weeks   after your baby is born). After this time you may choose to use a pacifier. Research has shown that pacifier use during the first year of a baby's life decreases the risk of sudden infant death syndrome (SIDS). Allow your baby to feed on each breast as long as he or she wants. Breastfeed until your baby is finished feeding. When your baby unlatches or falls asleep while feeding from the first breast, offer the second breast. Because newborns are often sleepy in the first few weeks of life, you may need to awaken your baby to get him or her to feed. Breastfeeding times will vary from baby to baby. However, the following rules can serve as a guide to help you ensure that your baby is properly fed:  Newborns (babies 4 weeks of age or younger) may breastfeed every 1-3 hours.  Newborns should not go longer than 3 hours  during the day or 5 hours during the night without breastfeeding.  You should breastfeed your baby a minimum of 8 times in a 24-hour period until you begin to introduce solid foods to your baby at around 6 months of age. BREAST MILK PUMPING Pumping and storing breast milk allows you to ensure that your baby is exclusively fed your breast milk, even at times when you are unable to breastfeed. This is especially important if you are going back to work while you are still breastfeeding or when you are not able to be present during feedings. Your lactation consultant can give you guidelines on how long it is safe to store breast milk. A breast pump is a machine that allows you to pump milk from your breast into a sterile bottle. The pumped breast milk can then be stored in a refrigerator or freezer. Some breast pumps are operated by hand, while others use electricity. Ask your lactation consultant which type will work best for you. Breast pumps can be purchased, but some hospitals and breastfeeding support groups lease breast pumps on a monthly basis. A lactation consultant can teach you how to hand express breast milk, if you prefer not to use a pump. CARING FOR YOUR BREASTS WHILE YOU BREASTFEED Nipples can become dry, cracked, and sore while breastfeeding. The following recommendations can help keep your breasts moisturized and healthy:  Avoid using soap on your nipples.  Wear a supportive bra. Although not required, special nursing bras and tank tops are designed to allow access to your breasts for breastfeeding without taking off your entire bra or top. Avoid wearing underwire-style bras or extremely tight bras.  Air dry your nipples for 3-4minutes after each feeding.  Use only cotton bra pads to absorb leaked breast milk. Leaking of breast milk between feedings is normal.  Use lanolin on your nipples after breastfeeding. Lanolin helps to maintain your skin's normal moisture barrier. If you use  pure lanolin, you do not need to wash it off before feeding your baby again. Pure lanolin is not toxic to your baby. You may also hand express a few drops of breast milk and gently massage that milk into your nipples and allow the milk to air dry. In the first few weeks after giving birth, some women experience extremely full breasts (engorgement). Engorgement can make your breasts feel heavy, warm, and tender to the touch. Engorgement peaks within 3-5 days after you give birth. The following recommendations can help ease engorgement:  Completely empty your breasts while breastfeeding or pumping. You may want to start by applying warm, moist heat (in   the shower or with warm water-soaked hand towels) just before feeding or pumping. This increases circulation and helps the milk flow. If your baby does not completely empty your breasts while breastfeeding, pump any extra milk after he or she is finished.  Wear a snug bra (nursing or regular) or tank top for 1-2 days to signal your body to slightly decrease milk production.  Apply ice packs to your breasts, unless this is too uncomfortable for you.  Make sure that your baby is latched on and positioned properly while breastfeeding. If engorgement persists after 48 hours of following these recommendations, contact your health care provider or a lactation consultant. OVERALL HEALTH CARE RECOMMENDATIONS WHILE BREASTFEEDING  Eat healthy foods. Alternate between meals and snacks, eating 3 of each per day. Because what you eat affects your breast milk, some of the foods may make your baby more irritable than usual. Avoid eating these foods if you are sure that they are negatively affecting your baby.  Drink milk, fruit juice, and water to satisfy your thirst (about 10 glasses a day).  Rest often, relax, and continue to take your prenatal vitamins to prevent fatigue, stress, and anemia.  Continue breast self-awareness checks.  Avoid chewing and smoking  tobacco. Chemicals from cigarettes that pass into breast milk and exposure to secondhand smoke may harm your baby.  Avoid alcohol and drug use, including marijuana. Some medicines that may be harmful to your baby can pass through breast milk. It is important to ask your health care provider before taking any medicine, including all over-the-counter and prescription medicine as well as vitamin and herbal supplements. It is possible to become pregnant while breastfeeding. If birth control is desired, ask your health care provider about options that will be safe for your baby. SEEK MEDICAL CARE IF:  You feel like you want to stop breastfeeding or have become frustrated with breastfeeding.  You have painful breasts or nipples.  Your nipples are cracked or bleeding.  Your breasts are red, tender, or warm.  You have a swollen area on either breast.  You have a fever or chills.  You have nausea or vomiting.  You have drainage other than breast milk from your nipples.  Your breasts do not become full before feedings by the fifth day after you give birth.  You feel sad and depressed.  Your baby is too sleepy to eat well.  Your baby is having trouble sleeping.   Your baby is wetting less than 3 diapers in a 24-hour period.  Your baby has less than 3 stools in a 24-hour period.  Your baby's skin or the white part of his or her eyes becomes yellow.   Your baby is not gaining weight by 5 days of age. SEEK IMMEDIATE MEDICAL CARE IF:  Your baby is overly tired (lethargic) and does not want to wake up and feed.  Your baby develops an unexplained fever.   This information is not intended to replace advice given to you by your health care provider. Make sure you discuss any questions you have with your health care provider.   Document Released: 05/31/2005 Document Revised: 02/19/2015 Document Reviewed: 11/22/2012 [employer] Interactive Patient Education 2016 [employer] Vaginal  Delivery During delivery, your health care provider will help you give birth to your baby. During a vaginal delivery, you will work to push the baby out of your vagina. However, before you can push your baby out, a few things need to happen. The opening of your uterus (  cervix) has to soften, thin out, and open up (dilate) all the way to 10 cm. Also, your baby has to move down from the uterus into your vagina.  SIGNS OF LABOR  Your health care provider will first need to make sure you are in labor. Signs of labor include:   Passing what is called the mucous plug before labor begins. This is a small amount of blood-stained mucus.  Having regular, painful uterine contractions.   The time between contractions gets shorter.   The discomfort and pain gradually get more intense.  Contraction pains get worse when walking and do not go away when resting.   Your cervix becomes thinner (effacement) and dilates. BEFORE THE DELIVERY Once you are in labor and admitted into the hospital or care center, your health care provider may do the following:   Perform a complete physical exam.  Review any complications related to pregnancy or labor.  Check your blood pressure, pulse, temperature, and heart rate (vital signs).   Determine if, and when, the rupture of amniotic membranes occurred.  Do a vaginal exam (using a sterile glove and lubricant) to determine:   The position (presentation) of the baby. Is the baby's head presenting first (vertex) in the birth canal (vagina), or are the feet or buttocks first (breech)?   The level (station) of the baby's head within the birth canal.   The effacement and dilatation of the cervix.   An electronic fetal monitor is usually placed on your abdomen when you first arrive. This is used to monitor your contractions and the baby's heart rate.  When the monitor is on your abdomen (external fetal monitor), it can only pick up the frequency and length of  your contractions. It cannot tell the strength of your contractions.  If it becomes necessary for your health care provider to know exactly how strong your contractions are or to see exactly what the baby's heart rate is doing, an internal monitor may be inserted into your vagina and uterus. Your health care provider will discuss the benefits and risks of using an internal monitor and obtain your permission before inserting the device.  Continuous fetal monitoring may be needed if you have an epidural, are receiving certain medicines (such as oxytocin), or have pregnancy or labor complications.  An IV access tube may be placed into a vein in your arm to deliver fluids and medicines if necessary. THREE STAGES OF LABOR AND DELIVERY Normal labor and delivery is divided into three stages. First Stage This stage starts when you begin to contract regularly and your cervix begins to efface and dilate. It ends when your cervix is completely open (fully dilated). The first stage is the longest stage of labor and can last from 3 hours to 15 hours.  Several methods are available to help with labor pain. You and your health care provider will decide which option is best for you. Options include:   Opioid medicines. These are strong pain medicines that you can get through your IV tube or as a shot into your muscle. These medicines lessen pain but do not make it go away completely.  Epidural. A medicine is given through a thin tube that is inserted in your back. The medicine numbs the lower part of your body and prevents any pain in that area.  Paracervical pain medicine. This is an injection of an anesthetic on each side of your cervix.   You may request natural childbirth, which does not involve the   use of pain medicines or an epidural during labor and delivery. Instead, you will use other things, such as breathing exercises, to help cope with the pain. Second Stage The second stage of labor begins when  your cervix is fully dilated at 10 cm. It continues until you push your baby down through the birth canal and the baby is born. This stage can take only minutes or several hours.  The location of your baby's head as it moves through the birth canal is reported as a number called a station. If the baby's head has not started its descent, the station is described as being at minus 3 (-3). When your baby's head is at the zero station, it is at the middle of the birth canal and is engaged in the pelvis. The station of your baby helps indicate the progress of the second stage of labor.  When your baby is born, your health care provider may hold the baby with his or her head lowered to prevent amniotic fluid, mucus, and blood from getting into the baby's lungs. The baby's mouth and nose may be suctioned with a small bulb syringe to remove any additional fluid.  Your health care provider may then place the baby on your stomach. It is important to keep the baby from getting cold. To do this, the health care provider will dry the baby off, place the baby directly on your skin (with no blankets between you and the baby), and cover the baby with warm, dry blankets.   The umbilical cord is cut. Third Stage During the third stage of labor, your health care provider will deliver the placenta (afterbirth) and make sure your bleeding is under control. The delivery of the placenta usually takes about 5 minutes but can take up to 30 minutes. After the placenta is delivered, a medicine may be given either by IV or injection to help contract the uterus and control bleeding. If you are planning to breastfeed, you can try to do so now. After you deliver the placenta, your uterus should contract and get very firm. If your uterus does not remain firm, your health care provider will massage it. This is important because the contraction of the uterus helps cut off bleeding at the site where the placenta was attached to your  uterus. If your uterus does not contract properly and stay firm, you may continue to bleed heavily. If there is a lot of bleeding, medicines may be given to contract the uterus and stop the bleeding.    This information is not intended to replace advice given to you by your health care provider. Make sure you discuss any questions you have with your health care provider.   Document Released: 03/09/2008 Document Revised: 06/21/2014 Document Reviewed: 01/26/2012 [employer] Interactive Patient Education 2016 [employer]  

## 2015-07-04 ENCOUNTER — Ambulatory Visit (INDEPENDENT_AMBULATORY_CARE_PROVIDER_SITE_OTHER): Payer: Medicaid Other | Admitting: Family Medicine

## 2015-07-04 VITALS — BP 133/88 | HR 88 | Wt 152.0 lb

## 2015-07-04 DIAGNOSIS — Z3403 Encounter for supervision of normal first pregnancy, third trimester: Secondary | ICD-10-CM

## 2015-07-04 NOTE — Patient Instructions (Addendum)
Breastfeeding Deciding to breastfeed is one of the best choices you can make for you and your baby. A change in hormones during pregnancy causes your breast tissue to grow and increases the number and size of your milk ducts. These hormones also allow proteins, sugars, and fats from your blood supply to make breast milk in your milk-producing glands. Hormones prevent breast milk from being released before your baby is born as well as prompt milk flow after birth. Once breastfeeding has begun, thoughts of your baby, as well as his or her sucking or crying, can stimulate the release of milk from your milk-producing glands.  BENEFITS OF BREASTFEEDING For Your Baby  Your first milk (colostrum) helps your baby's digestive system function better.  There are antibodies in your milk that help your baby fight off infections.  Your baby has a lower incidence of asthma, allergies, and sudden infant death syndrome.  The nutrients in breast milk are better for your baby than infant formulas and are designed uniquely for your baby's needs.  Breast milk improves your baby's brain development.  Your baby is less likely to develop other conditions, such as childhood obesity, asthma, or type 2 diabetes mellitus. For You  Breastfeeding helps to create a very special bond between you and your baby.  Breastfeeding is convenient. Breast milk is always available at the correct temperature and costs nothing.  Breastfeeding helps to burn calories and helps you lose the weight gained during pregnancy.  Breastfeeding makes your uterus contract to its prepregnancy size faster and slows bleeding (lochia) after you give birth.   Breastfeeding helps to lower your risk of developing type 2 diabetes mellitus, osteoporosis, and breast or ovarian cancer later in life. SIGNS THAT YOUR BABY IS HUNGRY Early Signs of Hunger  Increased alertness or activity.  Stretching.  Movement of the head from side to  side.  Movement of the head and opening of the mouth when the corner of the mouth or cheek is stroked (rooting).  Increased sucking sounds, smacking lips, cooing, sighing, or squeaking.  Hand-to-mouth movements.  Increased sucking of fingers or hands. Late Signs of Hunger  Fussing.  Intermittent crying. Extreme Signs of Hunger Signs of extreme hunger will require calming and consoling before your baby will be able to breastfeed successfully. Do not wait for the following signs of extreme hunger to occur before you initiate breastfeeding:  Restlessness.  A loud, strong cry.  Screaming. BREASTFEEDING BASICS Breastfeeding Initiation  Find a comfortable place to sit or lie down, with your neck and back well supported.  Place a pillow or rolled up blanket under your baby to bring him or her to the level of your breast (if you are seated). Nursing pillows are specially designed to help support your arms and your baby while you breastfeed.  Make sure that your baby's abdomen is facing your abdomen.  Gently massage your breast. With your fingertips, massage from your chest wall toward your nipple in a circular motion. This encourages milk flow. You may need to continue this action during the feeding if your milk flows slowly.  Support your breast with 4 fingers underneath and your thumb above your nipple. Make sure your fingers are well away from your nipple and your baby's mouth.  Stroke your baby's lips gently with your finger or nipple.  When your baby's mouth is open wide enough, quickly bring your baby to your breast, placing your entire nipple and as much of the colored area around your nipple (  areola) as possible into your baby's mouth.  More areola should be visible above your baby's upper lip than below the lower lip.  Your baby's tongue should be between his or her lower gum and your breast.  Ensure that your baby's mouth is correctly positioned around your nipple  (latched). Your baby's lips should create a seal on your breast and be turned out (everted).  It is common for your baby to suck about 2-3 minutes in order to start the flow of breast milk. Latching Teaching your baby how to latch on to your breast properly is very important. An improper latch can cause nipple pain and decreased milk supply for you and poor weight gain in your baby. Also, if your baby is not latched onto your nipple properly, he or she may swallow some air during feeding. This can make your baby fussy. Burping your baby when you switch breasts during the feeding can help to get rid of the air. However, teaching your baby to latch on properly is still the best way to prevent fussiness from swallowing air while breastfeeding. Signs that your baby has successfully latched on to your nipple:  Silent tugging or silent sucking, without causing you pain.  Swallowing heard between every 3-4 sucks.  Muscle movement above and in front of his or her ears while sucking. Signs that your baby has not successfully latched on to nipple:  Sucking sounds or smacking sounds from your baby while breastfeeding.  Nipple pain. If you think your baby has not latched on correctly, slip your finger into the corner of your baby's mouth to break the suction and place it between your baby's gums. Attempt breastfeeding initiation again. Signs of Successful Breastfeeding Signs from your baby:  A gradual decrease in the number of sucks or complete cessation of sucking.  Falling asleep.  Relaxation of his or her body.  Retention of a small amount of milk in his or her mouth.  Letting go of your breast by himself or herself. Signs from you:  Breasts that have increased in firmness, weight, and size 1-3 hours after feeding.  Breasts that are softer immediately after breastfeeding.  Increased milk volume, as well as a change in milk consistency and color by the fifth day of breastfeeding.  Nipples  that are not sore, cracked, or bleeding. Signs That Your Baby is Getting Enough Milk  Wetting at least 3 diapers in a 24-hour period. The urine should be clear and pale yellow by age 5 days.  At least 3 stools in a 24-hour period by age 5 days. The stool should be soft and yellow.  At least 3 stools in a 24-hour period by age 7 days. The stool should be seedy and yellow.  No loss of weight greater than 10% of birth weight during the first 3 days of age.  Average weight gain of 4-7 ounces (113-198 g) per week after age 4 days.  Consistent daily weight gain by age 5 days, without weight loss after the age of 2 weeks. After a feeding, your baby may spit up a small amount. This is common. BREASTFEEDING FREQUENCY AND DURATION Frequent feeding will help you make more milk and can prevent sore nipples and breast engorgement. Breastfeed when you feel the need to reduce the fullness of your breasts or when your baby shows signs of hunger. This is called "breastfeeding on demand." Avoid introducing a pacifier to your baby while you are working to establish breastfeeding (the first 4-6 weeks   after your baby is born). After this time you may choose to use a pacifier. Research has shown that pacifier use during the first year of a baby's life decreases the risk of sudden infant death syndrome (SIDS). Allow your baby to feed on each breast as long as he or she wants. Breastfeed until your baby is finished feeding. When your baby unlatches or falls asleep while feeding from the first breast, offer the second breast. Because newborns are often sleepy in the first few weeks of life, you may need to awaken your baby to get him or her to feed. Breastfeeding times will vary from baby to baby. However, the following rules can serve as a guide to help you ensure that your baby is properly fed:  Newborns (babies 4 weeks of age or younger) may breastfeed every 1-3 hours.  Newborns should not go longer than 3 hours  during the day or 5 hours during the night without breastfeeding.  You should breastfeed your baby a minimum of 8 times in a 24-hour period until you begin to introduce solid foods to your baby at around 6 months of age. BREAST MILK PUMPING Pumping and storing breast milk allows you to ensure that your baby is exclusively fed your breast milk, even at times when you are unable to breastfeed. This is especially important if you are going back to work while you are still breastfeeding or when you are not able to be present during feedings. Your lactation consultant can give you guidelines on how long it is safe to store breast milk. A breast pump is a machine that allows you to pump milk from your breast into a sterile bottle. The pumped breast milk can then be stored in a refrigerator or freezer. Some breast pumps are operated by hand, while others use electricity. Ask your lactation consultant which type will work best for you. Breast pumps can be purchased, but some hospitals and breastfeeding support groups lease breast pumps on a monthly basis. A lactation consultant can teach you how to hand express breast milk, if you prefer not to use a pump. CARING FOR YOUR BREASTS WHILE YOU BREASTFEED Nipples can become dry, cracked, and sore while breastfeeding. The following recommendations can help keep your breasts moisturized and healthy:  Avoid using soap on your nipples.  Wear a supportive bra. Although not required, special nursing bras and tank tops are designed to allow access to your breasts for breastfeeding without taking off your entire bra or top. Avoid wearing underwire-style bras or extremely tight bras.  Air dry your nipples for 3-4minutes after each feeding.  Use only cotton bra pads to absorb leaked breast milk. Leaking of breast milk between feedings is normal.  Use lanolin on your nipples after breastfeeding. Lanolin helps to maintain your skin's normal moisture barrier. If you use  pure lanolin, you do not need to wash it off before feeding your baby again. Pure lanolin is not toxic to your baby. You may also hand express a few drops of breast milk and gently massage that milk into your nipples and allow the milk to air dry. In the first few weeks after giving birth, some women experience extremely full breasts (engorgement). Engorgement can make your breasts feel heavy, warm, and tender to the touch. Engorgement peaks within 3-5 days after you give birth. The following recommendations can help ease engorgement:  Completely empty your breasts while breastfeeding or pumping. You may want to start by applying warm, moist heat (in   the shower or with warm water-soaked hand towels) just before feeding or pumping. This increases circulation and helps the milk flow. If your baby does not completely empty your breasts while breastfeeding, pump any extra milk after he or she is finished.  Wear a snug bra (nursing or regular) or tank top for 1-2 days to signal your body to slightly decrease milk production.  Apply ice packs to your breasts, unless this is too uncomfortable for you.  Make sure that your baby is latched on and positioned properly while breastfeeding. If engorgement persists after 48 hours of following these recommendations, contact your health care provider or a Advertising copywriter. OVERALL HEALTH CARE RECOMMENDATIONS WHILE BREASTFEEDING  Eat healthy foods. Alternate between meals and snacks, eating 3 of each per day. Because what you eat affects your breast milk, some of the foods may make your baby more irritable than usual. Avoid eating these foods if you are sure that they are negatively affecting your baby.  Drink milk, fruit juice, and water to satisfy your thirst (about 10 glasses a day).  Rest often, relax, and continue to take your prenatal vitamins to prevent fatigue, stress, and anemia.  Continue breast self-awareness checks.  Avoid chewing and smoking  tobacco. Chemicals from cigarettes that pass into breast milk and exposure to secondhand smoke may harm your baby.  Avoid alcohol and drug use, including marijuana. Some medicines that may be harmful to your baby can pass through breast milk. It is important to ask your health care provider before taking any medicine, including all over-the-counter and prescription medicine as well as vitamin and herbal supplements. It is possible to become pregnant while breastfeeding. If birth control is desired, ask your health care provider about options that will be safe for your baby. SEEK MEDICAL CARE IF:  You feel like you want to stop breastfeeding or have become frustrated with breastfeeding.  You have painful breasts or nipples.  Your nipples are cracked or bleeding.  Your breasts are red, tender, or warm.  You have a swollen area on either breast.  You have a fever or chills.  You have nausea or vomiting.  You have drainage other than breast milk from your nipples.  Your breasts do not become full before feedings by the fifth day after you give birth.  You feel sad and depressed.  Your baby is too sleepy to eat well.  Your baby is having trouble sleeping.   Your baby is wetting less than 3 diapers in a 24-hour period.  Your baby has less than 3 stools in a 24-hour period.  Your baby's skin or the white part of his or her eyes becomes yellow.   Your baby is not gaining weight by 52 days of age. SEEK IMMEDIATE MEDICAL CARE IF:  Your baby is overly tired (lethargic) and does not want to wake up and feed.  Your baby develops an unexplained fever.   This information is not intended to replace advice given to you by your health care provider. Make sure you discuss any questions you have with your health care provider.   Document Released: 05/31/2005 Document Revised: 02/19/2015 Document Reviewed: 11/22/2012 Elsevier Interactive Patient Education 2016 [employer] Preeclampsia  and Eclampsia Preeclampsia is a serious condition that develops only during pregnancy. It is also called toxemia of pregnancy. This condition causes high blood pressure along with other symptoms, such as swelling and headaches. These may develop as the condition gets worse. Preeclampsia may occur 20 weeks or later  into your pregnancy.  Diagnosing and treating preeclampsia early is very important. If not treated early, it can cause serious problems for you and your baby. One problem it can lead to is eclampsia, which is a condition that causes muscle jerking or shaking (convulsions) in the mother. Delivering your baby is the best treatment for preeclampsia or eclampsia.  RISK FACTORS The cause of preeclampsia is not known. You may be more likely to develop preeclampsia if you have certain risk factors. These include:   Being pregnant for the first time.  Having preeclampsia in a past pregnancy.  Having a family history of preeclampsia.  Having high blood pressure.  Being pregnant with twins or triplets.  Being 69 or older.  Being African American.  Having kidney disease or diabetes.  Having medical conditions such as lupus or blood diseases.  Being very overweight (obese). SIGNS AND SYMPTOMS  The earliest signs of preeclampsia are:  High blood pressure.  Increased protein in your urine. Your health care provider will check for this at every prenatal visit. Other symptoms that can develop include:   Severe headaches.  Sudden weight gain.  Swelling of your hands, face, legs, and feet.  Feeling sick to your stomach (nauseous) and throwing up (vomiting).  Vision problems (blurred or double vision).  Numbness in your face, arms, legs, and feet.  Dizziness.  Slurred speech.  Sensitivity to bright lights.  Abdominal pain. DIAGNOSIS  There are no screening tests for preeclampsia. Your health care provider will ask you about symptoms and check for signs of preeclampsia  during your prenatal visits. You may also have tests, including:  Urine testing.  Blood testing.  Checking your baby's heart rate.  Checking the health of your baby and your placenta using images created with sound waves (ultrasound). TREATMENT  You can work out the best treatment approach together with your health care provider. It is very important to keep all prenatal appointments. If you have an increased risk of preeclampsia, you may need more frequent prenatal exams.  Your health care provider may prescribe bed rest.  You may have to eat as little salt as possible.  You may need to take medicine to lower your blood pressure if the condition does not respond to more conservative measures.  You may need to stay in the hospital if your condition is severe. There, treatment will focus on controlling your blood pressure and fluid retention. You may also need to take medicine to prevent seizures.  If the condition gets worse, your baby may need to be delivered early to protect you and the baby. You may have your labor started with medicine (be induced), or you may have a cesarean delivery.  Preeclampsia usually goes away after the baby is born. HOME CARE INSTRUCTIONS   Only take over-the-counter or prescription medicines as directed by your health care provider.  Lie on your left side while resting. This keeps pressure off your baby.  Elevate your feet while resting.  Get regular exercise. Ask your health care provider what type of exercise is safe for you.  Avoid caffeine and alcohol.  Do not smoke.  Drink 6-8 glasses of water every day.  Eat a balanced diet that is low in salt. Do not add salt to your food.  Avoid stressful situations as much as possible.  Get plenty of rest and sleep.  Keep all prenatal appointments and tests as scheduled. SEEK MEDICAL CARE IF:  You are gaining more weight than expected.  You  have any headaches, abdominal pain, or nausea.  You  are bruising more than usual.  You feel dizzy or light-headed. SEEK IMMEDIATE MEDICAL CARE IF:   You develop sudden or severe swelling anywhere in your body. This usually happens in the legs.  You gain 5 lb (2.3 kg) or more in a week.  You have a severe headache, dizziness, problems with your vision, or confusion.  You have severe abdominal pain.  You have lasting nausea or vomiting.  You have a seizure.  You have trouble moving any part of your body.  You develop numbness in your body.  You have trouble speaking.  You have any abnormal bleeding.  You develop a stiff neck.  You pass out. MAKE SURE YOU:   Understand these instructions.  Will watch your condition.  Will get help right away if you are not doing well or get worse.   This information is not intended to replace advice given to you by your health care provider. Make sure you discuss any questions you have with your health care provider.   Document Released: 05/28/2000 Document Revised: 06/05/2013 Document Reviewed: 03/23/2013 Elsevier Interactive Patient Education 2016 [employer]. Vaginal Delivery During delivery, your health care provider will help you give birth to your baby. During a vaginal delivery, you will work to push the baby out of your vagina. However, before you can push your baby out, a few things need to happen. The opening of your uterus (cervix) has to soften, thin out, and open up (dilate) all the way to 10 cm. Also, your baby has to move down from the uterus into your vagina.  SIGNS OF LABOR  Your health care provider will first need to make sure you are in labor. Signs of labor include:   Passing what is called the mucous plug before labor begins. This is a small amount of blood-stained mucus.  Having regular, painful uterine contractions.   The time between contractions gets shorter.   The discomfort and pain gradually get more intense.  Contraction pains get worse when walking  and do not go away when resting.   Your cervix becomes thinner (effacement) and dilates. BEFORE THE DELIVERY Once you are in labor and admitted into the hospital or care center, your health care provider may do the following:   Perform a complete physical exam.  Review any complications related to pregnancy or labor.  Check your blood pressure, pulse, temperature, and heart rate (vital signs).   Determine if, and when, the rupture of amniotic membranes occurred.  Do a vaginal exam (using a sterile glove and lubricant) to determine:   The position (presentation) of the baby. Is the baby's head presenting first (vertex) in the birth canal (vagina), or are the feet or buttocks first (breech)?   The level (station) of the baby's head within the birth canal.   The effacement and dilatation of the cervix.   An electronic fetal monitor is usually placed on your abdomen when you first arrive. This is used to monitor your contractions and the baby's heart rate.  When the monitor is on your abdomen (external fetal monitor), it can only pick up the frequency and length of your contractions. It cannot tell the strength of your contractions.  If it becomes necessary for your health care provider to know exactly how strong your contractions are or to see exactly what the baby's heart rate is doing, an internal monitor may be inserted into your vagina and uterus. Your health  care provider will discuss the benefits and risks of using an internal monitor and obtain your permission before inserting the device.  Continuous fetal monitoring may be needed if you have an epidural, are receiving certain medicines (such as oxytocin), or have pregnancy or labor complications.  An IV access tube may be placed into a vein in your arm to deliver fluids and medicines if necessary. THREE STAGES OF LABOR AND DELIVERY Normal labor and delivery is divided into three stages. First Stage This stage starts when  you begin to contract regularly and your cervix begins to efface and dilate. It ends when your cervix is completely open (fully dilated). The first stage is the longest stage of labor and can last from 3 hours to 15 hours.  Several methods are available to help with labor pain. You and your health care provider will decide which option is best for you. Options include:   Opioid medicines. These are strong pain medicines that you can get through your IV tube or as a shot into your muscle. These medicines lessen pain but do not make it go away completely.  Epidural. A medicine is given through a thin tube that is inserted in your back. The medicine numbs the lower part of your body and prevents any pain in that area.  Paracervical pain medicine. This is an injection of an anesthetic on each side of your cervix.   You may request natural childbirth, which does not involve the use of pain medicines or an epidural during labor and delivery. Instead, you will use other things, such as breathing exercises, to help cope with the pain. Second Stage The second stage of labor begins when your cervix is fully dilated at 10 cm. It continues until you push your baby down through the birth canal and the baby is born. This stage can take only minutes or several hours.  The location of your baby's head as it moves through the birth canal is reported as a number called a station. If the baby's head has not started its descent, the station is described as being at minus 3 (-3). When your baby's head is at the zero station, it is at the middle of the birth canal and is engaged in the pelvis. The station of your baby helps indicate the progress of the second stage of labor.  When your baby is born, your health care provider may hold the baby with his or her head lowered to prevent amniotic fluid, mucus, and blood from getting into the baby's lungs. The baby's mouth and nose may be suctioned with a small bulb syringe to  remove any additional fluid.  Your health care provider may then place the baby on your stomach. It is important to keep the baby from getting cold. To do this, the health care provider will dry the baby off, place the baby directly on your skin (with no blankets between you and the baby), and cover the baby with warm, dry blankets.   The umbilical cord is cut. Third Stage During the third stage of labor, your health care provider will deliver the placenta (afterbirth) and make sure your bleeding is under control. The delivery of the placenta usually takes about 5 minutes but can take up to 30 minutes. After the placenta is delivered, a medicine may be given either by IV or injection to help contract the uterus and control bleeding. If you are planning to breastfeed, you can try to do so now. After you  deliver the placenta, your uterus should contract and get very firm. If your uterus does not remain firm, your health care provider will massage it. This is important because the contraction of the uterus helps cut off bleeding at the site where the placenta was attached to your uterus. If your uterus does not contract properly and stay firm, you may continue to bleed heavily. If there is a lot of bleeding, medicines may be given to contract the uterus and stop the bleeding.    This information is not intended to replace advice given to you by your health care provider. Make sure you discuss any questions you have with your health care provider.   Document Released: 03/09/2008 Document Revised: 06/21/2014 Document Reviewed: 01/26/2012 Elsevier Interactive Patient Education [employer]

## 2015-07-04 NOTE — Progress Notes (Signed)
Subjective:  Yvonne Hood is a 20 y.o. G1P0000 at [redacted]w[redacted]d being seen today for ongoing prenatal care.  She is currently monitored for the following issues for this low-risk pregnancy and has Adolescent dysmenorrhea; Migraine without aura; Supervision of normal first pregnancy, antepartum; Rubella non-immune status, antepartum; and Headache, rebound on her problem list.  Patient reports headache.  Contractions: Irregular. Vag. Bleeding: None.  Movement: Present. Denies leaking of fluid.   The following portions of the patient's history were reviewed and updated as appropriate: allergies, current medications, past family history, past medical history, past social history, past surgical history and problem list. Problem list updated.  Objective:   Filed Vitals:   07/04/15 1048 07/04/15 1111  BP: 141/91 133/88  Pulse: 88   Weight: 152 lb (68.947 kg)     Fetal Status: Fetal Heart Rate (bpm): 128 Fundal Height: 34 cm Movement: Present  Presentation: Vertex  General:  Alert, oriented and cooperative. Patient is in no acute distress.  Skin: Skin is warm and dry. No rash noted.   Cardiovascular: Normal heart rate noted  Respiratory: Normal respiratory effort, no problems with respiration noted  Abdomen: Soft, gravid, appropriate for gestational age. Pain/Pressure: Present     Pelvic: Vag. Bleeding: None Vag D/C Character: Thin   Cervical exam performed Dilation: 2 Effacement (%): 60 Station: -2  Extremities: Normal range of motion.  Edema: Trace  Mental Status: Normal mood and affect. Normal behavior. Normal judgment and thought content.   Urinalysis: Urine Protein: Negative Urine Glucose: Negative  Assessment and Plan:  Pregnancy: G1P0000 at [redacted]w[redacted]d  1. Supervision of normal first pregnancy, antepartum, third trimester Watch BP closely--peeclampsia precautions given Consider membrane stripping next visit.  Term labor symptoms and general obstetric precautions including but not limited  to vaginal bleeding, contractions, leaking of fluid and fetal movement were reviewed in detail with the patient. Please refer to After Visit Summary for other counseling recommendations.  Return in 1 week (on 07/11/2015).   Reva Bores, MD

## 2015-07-06 ENCOUNTER — Inpatient Hospital Stay (HOSPITAL_COMMUNITY)
Admission: AD | Admit: 2015-07-06 | Discharge: 2015-07-09 | DRG: 775 | Disposition: A | Discharge status: Home / Self Care | Payer: Medicaid Other | Source: Ambulatory Visit | Attending: Family Medicine | Admitting: Family Medicine

## 2015-07-06 ENCOUNTER — Encounter (HOSPITAL_COMMUNITY): Payer: Self-pay | Admitting: *Deleted

## 2015-07-06 DIAGNOSIS — IMO0001 Reserved for inherently not codable concepts without codable children: Secondary | ICD-10-CM

## 2015-07-06 DIAGNOSIS — O9989 Other specified diseases and conditions complicating pregnancy, childbirth and the puerperium: Secondary | ICD-10-CM

## 2015-07-06 DIAGNOSIS — O09899 Supervision of other high risk pregnancies, unspecified trimester: Secondary | ICD-10-CM

## 2015-07-06 DIAGNOSIS — O133 Gestational [pregnancy-induced] hypertension without significant proteinuria, third trimester: Secondary | ICD-10-CM

## 2015-07-06 DIAGNOSIS — O411230 Chorioamnionitis, third trimester, not applicable or unspecified: Secondary | ICD-10-CM | POA: Diagnosis present

## 2015-07-06 DIAGNOSIS — Z283 Underimmunization status: Secondary | ICD-10-CM

## 2015-07-06 DIAGNOSIS — Z2839 Other underimmunization status: Secondary | ICD-10-CM

## 2015-07-06 DIAGNOSIS — Z3403 Encounter for supervision of normal first pregnancy, third trimester: Secondary | ICD-10-CM

## 2015-07-06 DIAGNOSIS — Z3A38 38 weeks gestation of pregnancy: Secondary | ICD-10-CM

## 2015-07-06 DIAGNOSIS — Z833 Family history of diabetes mellitus: Secondary | ICD-10-CM

## 2015-07-06 DIAGNOSIS — Z8249 Family history of ischemic heart disease and other diseases of the circulatory system: Secondary | ICD-10-CM | POA: Diagnosis not present

## 2015-07-06 DIAGNOSIS — O134 Gestational [pregnancy-induced] hypertension without significant proteinuria, complicating childbirth: Secondary | ICD-10-CM | POA: Diagnosis present

## 2015-07-06 LAB — COMPREHENSIVE METABOLIC PANEL
ALBUMIN: 2.9 g/dL — AB (ref 3.5–5.0)
ALT: 13 U/L — ABNORMAL LOW (ref 14–54)
ANION GAP: 12 (ref 5–15)
AST: 19 U/L (ref 15–41)
Alkaline Phosphatase: 183 U/L — ABNORMAL HIGH (ref 38–126)
BUN: 10 mg/dL (ref 6–20)
CHLORIDE: 105 mmol/L (ref 101–111)
CO2: 20 mmol/L — ABNORMAL LOW (ref 22–32)
Calcium: 9.5 mg/dL (ref 8.9–10.3)
Creatinine, Ser: 0.82 mg/dL (ref 0.44–1.00)
GFR calc Af Amer: >60 mL/min (ref >60)
GFR calc non Af Amer: >60 mL/min (ref >60)
GLUCOSE: 75 mg/dL (ref 65–99)
POTASSIUM: 3.4 mmol/L — AB (ref 3.5–5.1)
SODIUM: 137 mmol/L (ref 135–145)
Total Bilirubin: 0.5 mg/dL (ref 0.3–1.2)
Total Protein: 7 g/dL (ref 6.5–8.1)

## 2015-07-06 LAB — CBC
HCT: 35.4 % — ABNORMAL LOW (ref 36.0–46.0)
Hemoglobin: 11.5 g/dL — ABNORMAL LOW (ref 12.0–15.0)
MCH: 26.9 pg (ref 26.0–34.0)
MCHC: 32.5 g/dL (ref 30.0–36.0)
MCV: 82.7 fL (ref 78.0–100.0)
PLATELETS: 303 10*3/uL (ref 150–400)
RBC: 4.28 MIL/uL (ref 3.87–5.11)
RDW: 14.1 % (ref 11.5–15.5)
WBC: 13.8 10*3/uL — AB (ref 4.0–10.5)

## 2015-07-06 LAB — PROTEIN / CREATININE RATIO, URINE
Creatinine, Urine: 44 mg/dL
Total Protein, Urine: <6 mg/dL

## 2015-07-06 LAB — TYPE AND SCREEN
ABO/RH(D): A POS
ANTIBODY SCREEN: NEGATIVE

## 2015-07-06 MED ORDER — OXYCODONE-ACETAMINOPHEN 5-325 MG PO TABS: 1.0000 | ORAL_TABLET | ORAL | Status: DC | PRN

## 2015-07-06 MED ORDER — LACTATED RINGERS IV SOLN
500.0000 mL | INTRAVENOUS | Status: DC | PRN
  Administered 2015-07-07: 1000 mL via INTRAVENOUS
  Administered 2015-07-07 (×3): 500 mL via INTRAVENOUS

## 2015-07-06 MED ORDER — ONDANSETRON HCL 4 MG/2ML IJ SOLN: 4.0000 mg | Freq: Four times a day (QID) | INTRAMUSCULAR | Status: DC | PRN

## 2015-07-06 MED ORDER — FENTANYL CITRATE (PF) 100 MCG/2ML IJ SOLN
100.0000 ug | INTRAMUSCULAR | Status: DC | PRN
  Administered 2015-07-06: 100 ug via INTRAVENOUS

## 2015-07-06 MED ORDER — CITRIC ACID-SODIUM CITRATE 334-500 MG/5ML PO SOLN: 30.0000 mL | ORAL | Status: DC | PRN

## 2015-07-06 MED ORDER — LIDOCAINE HCL (PF) 1 % IJ SOLN
30.0000 mL | INTRAMUSCULAR | Status: DC | PRN
  Administered 2015-07-07: 30 mL via SUBCUTANEOUS
  Filled 2015-07-06: qty 30

## 2015-07-06 MED ORDER — OXYCODONE-ACETAMINOPHEN 5-325 MG PO TABS: 2.0000 | ORAL_TABLET | ORAL | Status: DC | PRN

## 2015-07-06 MED ORDER — MORPHINE SULFATE (PF) 4 MG/ML IV SOLN
4.0000 mg | Freq: Once | INTRAVENOUS | Status: AC
Start: 2015-07-06 — End: 2015-07-06
  Administered 2015-07-06: 4 mg via INTRAMUSCULAR
  Filled 2015-07-06: qty 1

## 2015-07-06 MED ORDER — FLEET ENEMA 7-19 GM/118ML RE ENEM: 1.0000 | ENEMA | RECTAL | Status: DC | PRN

## 2015-07-06 MED ORDER — PROMETHAZINE HCL 25 MG/ML IJ SOLN
12.5000 mg | Freq: Once | INTRAMUSCULAR | Status: AC
  Administered 2015-07-06: 12.5 mg via INTRAMUSCULAR
  Filled 2015-07-06: qty 1

## 2015-07-06 MED ORDER — OXYTOCIN 10 UNIT/ML IJ SOLN
2.5000 [IU]/h | INTRAVENOUS | Status: DC
  Administered 2015-07-07: 2.5 [IU]/h via INTRAVENOUS

## 2015-07-06 MED ORDER — LACTATED RINGERS IV SOLN
INTRAVENOUS | Status: DC
  Administered 2015-07-06 – 2015-07-07 (×5): via INTRAVENOUS

## 2015-07-06 MED ORDER — ACETAMINOPHEN 325 MG PO TABS
650.0000 mg | ORAL_TABLET | ORAL | Status: DC | PRN
  Administered 2015-07-07 (×2): 650 mg via ORAL
  Filled 2015-07-06 (×2): qty 2

## 2015-07-06 MED ORDER — OXYTOCIN BOLUS FROM INFUSION: 500.0000 mL | INTRAVENOUS | Status: DC

## 2015-07-06 MED ORDER — FENTANYL CITRATE (PF) 100 MCG/2ML IJ SOLN
INTRAMUSCULAR | Status: AC
  Filled 2015-07-06: qty 2

## 2015-07-06 NOTE — MAU Note (Signed)
C/o contractions since 12N. Passed some mucous, little bit of spotting. States FM decreased some today.

## 2015-07-06 NOTE — H&P (Signed)
Yvonne Hood is a 20 y.o. female presenting for labor. GBS neg, low risk. Maternal Medical History:  Reason for admission: Contractions.   Contractions: Onset was 6-12 hours ago.   Frequency: regular.    Fetal activity: Perceived fetal activity is normal.   Last perceived fetal movement was within the past hour.    Prenatal complications: no prenatal complications Prenatal Complications - Diabetes: none.    OB History    Gravida Para Term Preterm AB TAB SAB Ectopic Multiple Living       Past Medical History  Diagnosis Date  . Headache(784.0)   . Hearing loss     Bilateral aids   Past Surgical History  Procedure Laterality Date  . Inner ear surgery      left ear   Family History: family history includes COPD in her mother; Diabetes in her mother; Heart disease in her father. Social History:  reports that she has never smoked. She has never used smokeless tobacco. She reports that she does not drink alcohol or use illicit drugs.   Prenatal Transfer Tool  Maternal Diabetes: No Genetic Screening: Normal Maternal Ultrasounds/Referrals: Normal Fetal Ultrasounds or other Referrals:  None Maternal Substance Abuse:  No Significant Maternal Medications:  None Significant Maternal Lab Results:  None Other Comments:  None  Review of Systems  Constitutional: Negative.   HENT: Negative.   Eyes: Negative.   Respiratory: Negative.   Cardiovascular: Negative.   Gastrointestinal: Positive for abdominal pain.  Genitourinary: Negative.   Musculoskeletal: Negative.   Skin: Negative.   Neurological: Negative.   Endo/Heme/Allergies: Negative.   Psychiatric/Behavioral: Negative.     Dilation: 2.5 Effacement (%): 70 Station: -2 Exam by:: S. Carrera, RNC Blood pressure 145/89, pulse 70, temperature 98.1 F (36.7 C), temperature source Oral, resp. rate 18, last menstrual period 10/09/2014. Maternal Exam:  Uterine Assessment: Contraction strength is  mild.  Contraction frequency is regular.   Abdomen: Patient reports no abdominal tenderness. Fetal presentation: vertex  Introitus: Normal vulva. Normal vagina.  Pelvis: adequate for delivery.   Cervix: Cervix evaluated by digital exam.     Fetal Exam Fetal Monitor Review: Mode: ultrasound.   Variability: moderate (6-25 bpm).    Fetal State Assessment: Category I - tracings are normal.     Physical Exam  Constitutional: She is oriented to person, place, and time. She appears well-developed and well-nourished.  HENT:  Head: Normocephalic.  Eyes: Pupils are equal, round, and reactive to light.  Neck: Normal range of motion.  Cardiovascular: Normal rate, regular rhythm, normal heart sounds and intact distal pulses.   Respiratory: Effort normal and breath sounds normal.  GI: Soft. Bowel sounds are normal.  Genitourinary: Vagina normal and uterus normal.  Musculoskeletal: Normal range of motion.  Neurological: She is alert and oriented to person, place, and time. She has normal reflexes.  Skin: Skin is warm and dry.  Psychiatric: She has a normal mood and affect. Her behavior is normal. Judgment and thought content normal.    Prenatal labs: ABO, Rh: A/POS/-- (07/05 1404) Antibody: NEG (07/05 1404) Rubella: 0.98 (07/05 1404) RPR: NON REAC (11/18 0945)  HBsAg: NEGATIVE (07/05 1404)  HIV: NONREACTIVE (11/18 0945)  GBS: Negative (01/05 0000)   Assessment/Plan: Mild hypertension that i feel is related to discomfort of contractions, probable early labor.  Plan: Pre eclampsia labs admit pt   Ferdie Ping 07/06/2015, 6:46 PM

## 2015-07-07 ENCOUNTER — Inpatient Hospital Stay (HOSPITAL_COMMUNITY): Payer: Medicaid Other | Admitting: Anesthesiology

## 2015-07-07 ENCOUNTER — Encounter (HOSPITAL_COMMUNITY): Payer: Self-pay | Admitting: General Practice

## 2015-07-07 DIAGNOSIS — Z3A38 38 weeks gestation of pregnancy: Secondary | ICD-10-CM

## 2015-07-07 DIAGNOSIS — O411230 Chorioamnionitis, third trimester, not applicable or unspecified: Secondary | ICD-10-CM

## 2015-07-07 DIAGNOSIS — O134 Gestational [pregnancy-induced] hypertension without significant proteinuria, complicating childbirth: Secondary | ICD-10-CM

## 2015-07-07 LAB — URINE MICROSCOPIC-ADD ON: BACTERIA UA: NONE SEEN

## 2015-07-07 LAB — CBC WITH DIFFERENTIAL/PLATELET
BASOS PCT: 0 %
Basophils Absolute: 0 10*3/uL (ref 0.0–0.1)
Eosinophils Absolute: 0 10*3/uL (ref 0.0–0.7)
Eosinophils Relative: 0 %
HCT: 33.3 % — ABNORMAL LOW (ref 36.0–46.0)
Hemoglobin: 10.8 g/dL — ABNORMAL LOW (ref 12.0–15.0)
LYMPHS ABS: 1.5 10*3/uL (ref 0.7–4.0)
Lymphocytes Relative: 8 %
MCH: 26.9 pg (ref 26.0–34.0)
MCHC: 32.4 g/dL (ref 30.0–36.0)
MCV: 82.8 fL (ref 78.0–100.0)
MONO ABS: 1.1 10*3/uL — AB (ref 0.1–1.0)
Monocytes Relative: 6 %
NEUTROS ABS: 16.2 10*3/uL — AB (ref 1.7–7.7)
Neutrophils Relative %: 86 %
OTHER: 0 %
PLATELETS: 263 10*3/uL (ref 150–400)
RBC: 4.02 MIL/uL (ref 3.87–5.11)
RDW: 14.3 % (ref 11.5–15.5)
WBC MORPHOLOGY: INCREASED
WBC: 18.8 10*3/uL — ABNORMAL HIGH (ref 4.0–10.5)

## 2015-07-07 LAB — URINALYSIS, ROUTINE W REFLEX MICROSCOPIC
Bilirubin Urine: NEGATIVE
Glucose, UA: NEGATIVE mg/dL
KETONES UR: NEGATIVE mg/dL
NITRITE: NEGATIVE
PROTEIN: NEGATIVE mg/dL
Specific Gravity, Urine: 1.01 (ref 1.005–1.030)
pH: 6 (ref 5.0–8.0)

## 2015-07-07 LAB — INFLUENZA PANEL BY PCR (TYPE A & B)
H1N1 flu by pcr: NOT DETECTED
Influenza A By PCR: NEGATIVE
Influenza B By PCR: NEGATIVE

## 2015-07-07 LAB — ABO/RH: ABO/RH(D): A POS

## 2015-07-07 MED ORDER — SENNOSIDES-DOCUSATE SODIUM 8.6-50 MG PO TABS
2.0000 | ORAL_TABLET | ORAL | Status: DC
  Administered 2015-07-08 (×2): 2 via ORAL
  Filled 2015-07-07 (×2): qty 2

## 2015-07-07 MED ORDER — ONDANSETRON HCL 4 MG PO TABS: 4.0000 mg | ORAL_TABLET | ORAL | Status: DC | PRN

## 2015-07-07 MED ORDER — BENZOCAINE-MENTHOL 20-0.5 % EX AERO
1.0000 "application " | INHALATION_SPRAY | CUTANEOUS | Status: DC | PRN
  Filled 2015-07-07 (×2): qty 56

## 2015-07-07 MED ORDER — LANOLIN HYDROUS EX OINT: TOPICAL_OINTMENT | CUTANEOUS | Status: DC | PRN

## 2015-07-07 MED ORDER — PRENATAL MULTIVITAMIN CH
1.0000 | ORAL_TABLET | Freq: Every day | ORAL | Status: DC
  Administered 2015-07-08 – 2015-07-09 (×2): 1 via ORAL
  Filled 2015-07-07 (×2): qty 1

## 2015-07-07 MED ORDER — DIPHENHYDRAMINE HCL 25 MG PO CAPS: 25.0000 mg | ORAL_CAPSULE | Freq: Four times a day (QID) | ORAL | Status: DC | PRN

## 2015-07-07 MED ORDER — PHENYLEPHRINE 40 MCG/ML (10ML) SYRINGE FOR IV PUSH (FOR BLOOD PRESSURE SUPPORT)
PREFILLED_SYRINGE | INTRAVENOUS | Status: AC
  Filled 2015-07-07: qty 20

## 2015-07-07 MED ORDER — ACETAMINOPHEN 325 MG PO TABS
650.0000 mg | ORAL_TABLET | ORAL | Status: DC | PRN
  Administered 2015-07-08 – 2015-07-09 (×3): 650 mg via ORAL
  Filled 2015-07-07 (×4): qty 2

## 2015-07-07 MED ORDER — FENTANYL 2.5 MCG/ML BUPIVACAINE 1/10 % EPIDURAL INFUSION (WH - ANES)
INTRAMUSCULAR | Status: AC
  Filled 2015-07-07: qty 125

## 2015-07-07 MED ORDER — SODIUM CHLORIDE 0.9 % IV SOLN
2.0000 g | Freq: Four times a day (QID) | INTRAVENOUS | Status: DC
  Administered 2015-07-07 – 2015-07-08 (×4): 2 g via INTRAVENOUS
  Filled 2015-07-07 (×5): qty 2000

## 2015-07-07 MED ORDER — DIPHENHYDRAMINE HCL 50 MG/ML IJ SOLN: 12.5000 mg | INTRAMUSCULAR | Status: DC | PRN

## 2015-07-07 MED ORDER — GENTAMICIN SULFATE 40 MG/ML IJ SOLN
1.5000 mg/kg | Freq: Three times a day (TID) | INTRAVENOUS | Status: AC
  Administered 2015-07-07 – 2015-07-08 (×4): 100 mg via INTRAVENOUS
  Filled 2015-07-07 (×4): qty 2.5

## 2015-07-07 MED ORDER — WITCH HAZEL-GLYCERIN EX PADS: 1.0000 "application " | MEDICATED_PAD | CUTANEOUS | Status: DC | PRN

## 2015-07-07 MED ORDER — OXYTOCIN 10 UNIT/ML IJ SOLN
1.0000 m[IU]/min | INTRAVENOUS | Status: DC
  Administered 2015-07-07: 2 m[IU]/min via INTRAVENOUS
  Filled 2015-07-07: qty 4

## 2015-07-07 MED ORDER — EPHEDRINE 5 MG/ML INJ
10.0000 mg | INTRAVENOUS | Status: DC | PRN
Start: 2015-07-07 — End: 2015-07-07
  Filled 2015-07-07: qty 2

## 2015-07-07 MED ORDER — LACTATED RINGERS IV SOLN
INTRAVENOUS | Status: DC
  Administered 2015-07-07: 17:00:00 via INTRAUTERINE

## 2015-07-07 MED ORDER — ZOLPIDEM TARTRATE 5 MG PO TABS: 5.0000 mg | ORAL_TABLET | Freq: Every evening | ORAL | Status: DC | PRN

## 2015-07-07 MED ORDER — SIMETHICONE 80 MG PO CHEW: 80.0000 mg | CHEWABLE_TABLET | ORAL | Status: DC | PRN

## 2015-07-07 MED ORDER — TERBUTALINE SULFATE 1 MG/ML IJ SOLN: 0.2500 mg | Freq: Once | INTRAMUSCULAR | Status: DC | PRN

## 2015-07-07 MED ORDER — TETANUS-DIPHTH-ACELL PERTUSSIS 5-2.5-18.5 LF-MCG/0.5 IM SUSP: 0.5000 mL | Freq: Once | INTRAMUSCULAR | Status: DC

## 2015-07-07 MED ORDER — PHENYLEPHRINE 40 MCG/ML (10ML) SYRINGE FOR IV PUSH (FOR BLOOD PRESSURE SUPPORT)
80.0000 ug | PREFILLED_SYRINGE | INTRAVENOUS | Status: DC | PRN
  Filled 2015-07-07: qty 2
  Filled 2015-07-07: qty 20

## 2015-07-07 MED ORDER — LIDOCAINE HCL (PF) 1 % IJ SOLN
INTRAMUSCULAR | Status: DC | PRN
  Administered 2015-07-07: 5 mL via EPIDURAL
  Administered 2015-07-07: 3 mL via EPIDURAL
  Administered 2015-07-07: 2 mL via EPIDURAL

## 2015-07-07 MED ORDER — ONDANSETRON HCL 4 MG/2ML IJ SOLN: 4.0000 mg | INTRAMUSCULAR | Status: DC | PRN

## 2015-07-07 MED ORDER — FENTANYL 2.5 MCG/ML BUPIVACAINE 1/10 % EPIDURAL INFUSION (WH - ANES)
14.0000 mL/h | INTRAMUSCULAR | Status: DC | PRN
  Administered 2015-07-07 (×3): 14 mL/h via EPIDURAL
  Filled 2015-07-07 (×2): qty 125

## 2015-07-07 MED ORDER — LIDOCAINE-EPINEPHRINE (PF) 2 %-1:200000 IJ SOLN
INTRAMUSCULAR | Status: DC | PRN
  Administered 2015-07-07 (×2): 4 mL via EPIDURAL

## 2015-07-07 MED ORDER — DIBUCAINE 1 % RE OINT: 1.0000 "application " | TOPICAL_OINTMENT | RECTAL | Status: DC | PRN

## 2015-07-07 NOTE — Progress Notes (Addendum)
LABOR PROGRESS NOTE  Yvonne Hood is a 20 y.o. G1P0000 at [redacted]w[redacted]d  admitted for SOL found to have ghtn.  Subjective: Denies fever. Endorses a few days of stuffy nose, headache, body ache, runny nose. No abdominal pain. Some vaginal bleeding.  Objective: BP 140/86 mmHg  Pulse 94  Temp(Src) 100.9 F (38.3 C) (Oral)  Resp 18  Ht  (1.473 m)  Wt 152 lb (68.947 kg)  BMI 31.78 kg/m2  SpO2 98%  LMP 10/09/2014 or  Filed Vitals:   07/07/15 1300 07/07/15 1330 07/07/15 1400 07/07/15 1430  BP: 132/72 137/84 138/78 140/86  Pulse: 101 101 89 94  Temp:   100.4 F (38 C) 100.9 F (38.3 C)  TempSrc:   Oral Oral  Resp:   20 18  Height:      Weight:      SpO2:       No uterine tenderness.  165/mod/-a/intermittent shallow late decels 5/70/-2  Labs: Lab Results  Component Value Date   WBC 13.8* 07/06/2015   HGB 11.5* 07/06/2015   HCT 35.4* 07/06/2015   MCV 82.7 07/06/2015   PLT 303 07/06/2015    Patient Active Problem List   Diagnosis Date Noted  . Active labor at term 07/06/2015  . Headache, rebound 02/11/2015  . Rubella non-immune status, antepartum 12/19/2014  . Supervision of normal first pregnancy, antepartum 12/17/2014  . Migraine without aura 04/25/2012  . Adolescent dysmenorrhea 07/13/2011    Assessment / Plan: 20 y.o. G1P0000 at [redacted]w[redacted]d here for SOL with GHTN. Now with fetal tachycardia and maternal fever  Labor: s/p arom, iupc placed, titrate pitocin Fetal Wellbeing:  Cat 2 - s/p bolus, repositioning, now with O2 Pain Control:  epidural Anticipated MOD:  Vagina Fever, fetal tachycardia: ddx maternal infection, epidural, URI, abruption. Now two fevers > 30 minutes apart, and with fetal tachycardia, so will start amp/gent for presumed intraamniotic infection. Sending rapid flu. Urinalysis not suggestive of infection. No significant vaginal bleeding or uterine tenderness, but definitely bloody show, so possible abruption, and will continue to monitor closely.  Has been bolused twice.  Silvano Bilis, MD 07/07/2015, 2:39 PM

## 2015-07-07 NOTE — Progress Notes (Signed)
Labor Progress Note  MARLETTA BOUSQUET is a 20 y.o. G1P0000 at [redacted]w[redacted]d  admitted for early labor  S:  Patient doing okay. Continues to feel contractions.    O:  BP 146/87 mmHg  Pulse 84  Temp(Src) 99.2 F (37.3 C) (Oral)  Resp 18  Ht  (1.473 m)  Wt 68.947 kg (152 lb)  BMI 31.78 kg/m2  LMP 10/09/2014     FHT:  FHR: 130 bpm, variability: minimal ,  accelerations:  Present,  decelerations:  Present variable decel x 1 (mild) UC:  2-3 mins  SVE:   Dilation: 3.5 Effacement (%): 80 Station: -2 Exam by:: M.Merrill, RN Intact Membranes   Labs: Lab Results  Component Value Date   WBC 13.8* 07/06/2015   HGB 11.5* 07/06/2015   HCT 35.4* 07/06/2015   MCV 82.7 07/06/2015   PLT 303 07/06/2015    Assessment / Plan: 20 y.o. G1P0000 [redacted]w[redacted]d admitted for early labor  Spontaneous labor, progressing normally  Labor: Progressing normally Fetal Wellbeing:  Category II (one variable decel) Pain Control:  Fentanyl and patient desires epidural Anticipated MOD:  NSVD  Expectant management   Palma Holter, MD PGY 1 Family Medicine

## 2015-07-07 NOTE — Progress Notes (Signed)
LABOR PROGRESS NOTE  Yvonne Hood is a 20 y.o. G1P0000 at [redacted]w[redacted]d  admitted for sol, now with ghtn and triple I  Subjective: Dry mouth, no other complaints  Objective: BP 121/62 mmHg  Pulse 99  Temp(Src) 100.9 F (38.3 C) (Oral)  Resp 20  Ht  (1.473 m)  Wt 152 lb (68.947 kg)  BMI 31.78 kg/m2  SpO2 98%  LMP 10/09/2014 or  Filed Vitals:   07/07/15 1500 07/07/15 1530 07/07/15 1600 07/07/15 1630  BP: 142/82 134/71 135/76 121/62  Pulse: 93 97 106 99  Temp:  99.9 F (37.7 C) 100.9 F (38.3 C)   TempSrc:  Oral Oral   Resp:   20   Height:      Weight:      SpO2:        150/mod/-a/recurrent variable/late decels  Dilation: 5.5 Effacement (%): 80 Cervical Position: Anterior Station: -2 Presentation: Vertex Exam by:: Dr. Ashok Pall  Labs: Lab Results  Component Value Date   WBC 18.8* 07/07/2015   HGB 10.8* 07/07/2015   HCT 33.3* 07/07/2015   MCV 82.8 07/07/2015   PLT 263 07/07/2015    Patient Active Problem List   Diagnosis Date Noted  . Active labor at term 07/06/2015  . Headache, rebound 02/11/2015  . Rubella non-immune status, antepartum 12/19/2014  . Supervision of normal first pregnancy, antepartum 12/17/2014  . Migraine without aura 04/25/2012  . Adolescent dysmenorrhea 07/13/2011    Assessment / Plan: 20 y.o. G1P0000 at [redacted]w[redacted]d here for sol, now with ghtn and triple I and persistent category 2 strip  Labor: holding pitocin given tachysystole and recurrent variable/late decels. Starting amnioinfusion. Resume pit when able Fetal Wellbeing:  Cat 2 Pain Control:  epidural Anticipated MOD:  Vaginal, now moving toward possible c/s GHTN: no current indication for antihypertensive or Mg. Cont to monitor Triple I: elevated WBC and fetal tachycardia. Rapid flu pending. Amp/gent ordered.   Silvano Bilis, MD 07/07/2015, 4:39 PM

## 2015-07-07 NOTE — Anesthesia Preprocedure Evaluation (Addendum)
Anesthesia Evaluation  Patient identified by MRN, date of birth, ID band Patient awake    Reviewed: Allergy & Precautions, NPO status , Patient's Chart, lab work & pertinent test results  Airway Mallampati: II  TM Distance: >3 FB Neck ROM: Full    Dental  (+) Teeth Intact, Dental Advisory Given   Pulmonary neg pulmonary ROS,    Pulmonary exam normal breath sounds clear to auscultation       Cardiovascular negative cardio ROS Normal cardiovascular exam Rhythm:Regular Rate:Normal     Neuro/Psych  Headaches, Hearing loss    GI/Hepatic negative GI ROS, Neg liver ROS,   Endo/Other  OBESITY   Renal/GU negative Renal ROS     Musculoskeletal negative musculoskeletal ROS (+)   Abdominal   Peds  Hematology  (+) Blood dyscrasia, anemia , PLT 303K   Anesthesia Other Findings Day of surgery medications reviewed with the patient.  Reproductive/Obstetrics (+) Pregnancy                            Anesthesia Physical Anesthesia Plan  ASA: II  Anesthesia Plan: Epidural   Post-op Pain Management:    Induction:   Airway Management Planned:   Additional Equipment:   Intra-op Plan:   Post-operative Plan:   Informed Consent: I have reviewed the patients History and Physical, chart, labs and discussed the procedure including the risks, benefits and alternatives for the proposed anesthesia with the patient or authorized representative who has indicated his/her understanding and acceptance.   Dental advisory given  Plan Discussed with:   Anesthesia Plan Comments: (Patient identified. Risks/Benefits/Options discussed with patient including but not limited to bleeding, infection, nerve damage, paralysis, failed block, incomplete pain control, headache, blood pressure changes, nausea, vomiting, reactions to medication both or allergic, itching and postpartum back pain. Confirmed with bedside nurse the  patient's most recent platelet count. Confirmed with patient that they are not currently taking any anticoagulation, have any bleeding history or any family history of bleeding disorders. Patient expressed understanding and wished to proceed. All questions were answered. )        Anesthesia Quick Evaluation

## 2015-07-07 NOTE — Progress Notes (Signed)
Yvonne Hood is a 20 y.o. G1P0000 at [redacted]w[redacted]d   Subjective: Patient complaining of pressure  Objective: BP 133/89 mmHg  Pulse 116  Temp(Src) 103 F (39.4 C) (Oral)  Resp 18  Ht  (1.473 m)  Wt 152 lb (68.947 kg)  BMI 31.78 kg/m2  SpO2 98%  LMP 10/09/2014   Total I/O In: -  Out: 1075 [Urine:1075]  FHT:  FHR: 160-170 bpm, variability: moderate,  accelerations:  Abscent,  decelerations:  Absent UC:   regular, every 1-2 minutes SVE:   Dilation: 8.5 Effacement (%): 80 Station: 0, +1 Exam by:: Dr. Adrian Blackwater  Labs: Lab Results  Component Value Date   WBC 18.8* 07/07/2015   HGB 10.8* 07/07/2015   HCT 33.3* 07/07/2015   MCV 82.8 07/07/2015   PLT 263 07/07/2015    Assessment / Plan: Induction of labor due to gestational hypertension,  progressing well on pitocin  Labor: continue pitocin - making progress Preeclampsia:  labs stable Fetal Wellbeing:  Category II Pain Control:  Epidural - will ask anesthesiologist to redose epidural I/D:  intrauterine infection - on ampicillin and gent.  Continue tylenol Anticipated MOD:  NSVD  Adine Heimann JEHIEL 07/07/2015, 6:56 PM

## 2015-07-07 NOTE — Progress Notes (Signed)
LABOR PROGRESS NOTE  Yvonne Hood is a 20 y.o. G1P0000 at [redacted]w[redacted]d  admitted for labor. Found to have ghtn.  Subjective: Mild pain w/ contractions. No dysuria, no fever, no ha, no vision change, no ruq pain, no cough or sob.  Objective: BP 130/83 mmHg  Pulse 110  Temp(Src) 99.8 F (37.7 C) (Oral)  Resp 20  Ht  (1.473 m)  Wt 152 lb (68.947 kg)  BMI 31.78 kg/m2  SpO2 98%  LMP 10/09/2014 or  Filed Vitals:   07/07/15 1030 07/07/15 1100 07/07/15 1130 07/07/15 1200  BP: 131/81 131/95 119/69 130/83  Pulse: 94 101 85 110  Temp:   99.8 F (37.7 C) 99.8 F (37.7 C)  TempSrc:   Oral Oral  Resp:    20  Height:      Weight:      SpO2:        160/mod/+a/-d  Dilation: 5.5 Effacement (%): 70, 80 Cervical Position: Anterior Station: -3 Presentation: Vertex Exam by:: Lorretta Harp RNC  Labs: Lab Results  Component Value Date   WBC 13.8* 07/06/2015   HGB 11.5* 07/06/2015   HCT 35.4* 07/06/2015   MCV 82.7 07/06/2015   PLT 303 07/06/2015    Patient Active Problem List   Diagnosis Date Noted  . Active labor at term 07/06/2015  . Headache, rebound 02/11/2015  . Rubella non-immune status, antepartum 12/19/2014  . Supervision of normal first pregnancy, antepartum 12/17/2014  . Migraine without aura 04/25/2012  . Adolescent dysmenorrhea 07/13/2011    Assessment / Plan: 20 y.o. G1P0000 at [redacted]w[redacted]d here for labor c/b ghtn.  Labor: augmenting with pitocin, arom when safe to do so Fetal Wellbeing:  Borderline category 2 (borderline fetal tachycardia). No other signs/symptoms intraamniotic or other infectin. Will bolus.  Pain Control:  S/p epidural Anticipated MOD:  Vag GHTN: not on mg or antihypertnesive, recent BPs wnl, asymptomatic, continue to monitor  Silvano Bilis, MD 07/07/2015, 12:13 PM

## 2015-07-07 NOTE — Anesthesia Procedure Notes (Signed)

## 2015-07-08 LAB — RPR: RPR Ser Ql: NONREACTIVE

## 2015-07-08 MED ORDER — OXYCODONE-ACETAMINOPHEN 5-325 MG PO TABS
1.0000 | ORAL_TABLET | Freq: Four times a day (QID) | ORAL | Status: DC | PRN
  Administered 2015-07-08 – 2015-07-09 (×3): 1 via ORAL
  Filled 2015-07-08 (×3): qty 1

## 2015-07-08 MED ORDER — SODIUM CHLORIDE 0.9 % IV SOLN
2.0000 g | Freq: Four times a day (QID) | INTRAVENOUS | Status: DC
  Administered 2015-07-08: 2 g via INTRAVENOUS
  Filled 2015-07-08 (×2): qty 2000

## 2015-07-08 NOTE — Progress Notes (Signed)
Post Partum Day 1  Subjective:  Yvonne Hood is a 20 y.o. G1P1001 [redacted]w[redacted]d s/p SVD.  No acute events overnight. Pt had F103 during delivery, T99.3 this am.  BP wnl. Pt denies problems with ambulating, voiding or po intake.  She denies nausea or vomiting.  Pain is well controlled. Lochia Small.   Plan for birth control is oral contraceptives (estrogen/progesterone).   Method of Feeding: breast Circumcision: inpt vs outpt   Objective: BP 139/84 mmHg  Pulse 99  Temp(Src) 99.3 F (37.4 C) (Oral)  Resp 18  Ht  (1.473 m)  Wt 68.947 kg (152 lb)  BMI 31.78 kg/m2  SpO2 97%  LMP 10/09/2014  Breastfeeding? Unknown  Physical Exam:  General: alert, cooperative and no distress Lochia: normal flow Chest: CTAB Heart: RRR no m/r/g Abdomen: +BS, soft, nontender, fundus firm below umbilicus DVT Evaluation: No evidence of DVT seen on physical exam. Extremities: no edema   Recent Labs  07/06/15 1845 07/07/15 1430  HGB 11.5* 10.8*  HCT 35.4* 33.3*    Assessment/Plan: ASSESSMENT: Yvonne Hood is a 20 y.o. G1P1001 [redacted]w[redacted]d PPD #1 s/p NSVD doing well.   #Chorioamnitis:  -continue amp/gent 24hrs PP- stop at 2100  -monitor fever curve  Plan for DC tomorrow.    LOS: 2 days   [place] 07/08/2015, 8:40 AM   OB fellow attestation Post Partum Day 1 I have seen and examined this patient and agree with above documentation in the resident's note.   Yvonne Hood is a 20 y.o. G1P1001 s/p NSVD complicated by Triple I .  Pt denies problems with ambulating, voiding or po intake. Pain is well controlled.  Plan for birth control is oral contraceptives (estrogen/progesterone).  Method of Feeding: Breast/pumping  PE:  BP 139/84 mmHg  Pulse 99  Temp(Src) 99.3 F (37.4 C) (Oral)  Resp 18  Ht  (1.473 m)  Wt 152 lb (68.947 kg)  BMI 31.78 kg/m2  SpO2 97%  LMP 10/09/2014  Breastfeeding? Unknown Gen: well appearing Heart: reg rate Lungs: normal WOB Fundus firm-  TTP Ext: soft, no pain, no edema  Plan for discharge: PPD#2 Routine PP care  Triple I: Continue amp/gent until 2100. Follow up placental pathology Pain: poorly controlled today likely 2/2 to inability take ibuprofen . Percocet 5-325 q6 written today for pain.   Federico Flake, MD 11:25 AM

## 2015-07-08 NOTE — Anesthesia Postprocedure Evaluation (Signed)
Anesthesia Post Note  Patient: Yvonne Hood  Procedure(s) Performed: * No procedures listed *  Patient location during evaluation: Mother Baby Anesthesia Type: Epidural Level of consciousness: awake and alert and oriented Pain management: pain level controlled Vital Signs Assessment: post-procedure vital signs reviewed and stable Respiratory status: respiratory function stable Cardiovascular status: blood pressure returned to baseline Postop Assessment: no headache, no backache, no signs of nausea or vomiting, adequate PO intake and patient able to bend at knees Anesthetic complications: no    Last Vitals:  Filed Vitals:   07/08/15 0355 07/08/15 1156  BP: 139/84 115/69  Pulse: 99 87  Temp: 37.4 C 37.1 C  Resp: 18 18    Last Pain:  Filed Vitals:   07/08/15 1231  PainSc: 2                  Filomeno Cromley

## 2015-07-08 NOTE — Lactation Note (Signed)
This note was copied from the chart of Yvonne Hood. Lactation Consultation Note  Initial visit made. Providing Breastmilk For Your Baby in NICU at bedside.  Mom has been pumping every 3 hours and obtaining 5-20 mls of colostrum.  She has WIC in [employer]  Instructed to call them and arrange for a pump once discharged.  Encouraged to call with concerns/assist prn.  Patient Name: Yvonne Leara Rawl [id number] Date: 07/08/2015 Reason for consult: Initial assessment;NICU baby   Maternal Data    Feeding Feeding Type: Bottle Fed - Breast Milk Nipple Type: Slow - flow Length of feed: 10 min  LATCH Score/Interventions                      Lactation Tools Discussed/Used WIC Program: Yes Pump Review: Setup, frequency, and cleaning;Milk Storage Initiated by:: RN Date initiated:: 07/07/15   Consult Status Consult Status: Follow-up Date: 07/09/15 Follow-up type: In-patient    Huston Foley 07/08/2015, 12:25 PM

## 2015-07-08 NOTE — Progress Notes (Signed)
UR chart review completed.  

## 2015-07-09 ENCOUNTER — Encounter: Payer: Medicaid Other | Admitting: Certified Nurse Midwife

## 2015-07-09 DIAGNOSIS — O133 Gestational [pregnancy-induced] hypertension without significant proteinuria, third trimester: Secondary | ICD-10-CM

## 2015-07-09 MED ORDER — ACETAMINOPHEN 325 MG PO TABS: 650.0000 mg | ORAL_TABLET | ORAL | Status: DC | PRN

## 2015-07-09 MED ORDER — SENNOSIDES-DOCUSATE SODIUM 8.6-50 MG PO TABS: 1.0000 | ORAL_TABLET | Freq: Every day | ORAL | Status: DC

## 2015-07-09 NOTE — Clinical Social Work Maternal (Signed)
CLINICAL SOCIAL WORK MATERNAL/CHILD NOTE  Patient Details  Name: Opal Sidles MRN: [id number] Date of Birth: 06/21/1995  Date:  2016/03/13  Clinical Social Worker Initiating Note:  Icel Castles E. 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  She does not use sign language or need an interpreter.)   Date of Referral:        Reason for Referral:   (No referral-NICU admission)   Referral Source:      Address:   ([address] [place], [place] [address])  Phone number:  [phone]   Household Members:  Significant Other (MOB states that she and FOB live together and that numerous family members, including MGM live closeby.)   Natural Supports (not living in the home):  Immediate Family, Extended Family   Professional Supports:     Employment:     Type of Work:  (MOB works at [employer], but is unsure if she will return.  FOB works third shift at [employer] Careers adviser).)   Education:      Museum/gallery curator Resources:  Medicaid   Other Resources:  [facility]   Cultural/Religious Considerations Which May Impact Care: None stated  Strengths:  Home prepared for child , Compliance with medical plan , Ability to meet basic needs , Pediatrician chosen  (Pediatric follow up will be at [facility])   Risk Factors/Current Problems:  None   Cognitive State:  Alert , Linear Thinking , Goal Oriented , Insightful    Mood/Affect:  Calm , Relaxed , Interested    CSW Assessment: CSW met with parents and MGM in MOB's third floor room/306 to introduce services, offer support, and complete assessment due to baby's admission to NICU at 38.5 weeks.  Parents were quiet, but pleasant and state this is a good time to talk with them.  MOB reports feelings of sadness that she has to go home  today without her baby and states she wishes he could come home with her.  CSW normalized and validated these feelings.  MGM asked MOB if she had her hearing aids in.  She confirmed.  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  MGM talked about this experience at length.  MOB can hear some and reads lips.  She does not know sign language.   CSW inquired about parents support system.  MOB states she and FOB live together.  FOB was quiet, but engaged during assessment and spoke when CSW asked him a question.  MGM was quite talkative throughout discussion and states she lives close by to parents.  She reports other family members also nearby and MOB describes her family as supportive.  FOB states his supports are MOB's family.  They report that they have been in a relationship for 2 years.   Parents report having all necessary supplies for infant at home.  MOB reports that baby will sleep in a crib next to their bed.  CSW provided education regarding SIDS precautions, which parents were not familiar with.  Family was specifically surprised that smoking is a SIDS risk.  CSW asked if there is smoking in the home, as patient's room smelled of cigarette smoke.  MOB pointed to FOB and  said, "he does."  CSW made recommendation that FOB smoke outside and wash hands/change clothes before handling baby.  FOB states a commitment to doing this and states this was already his plan once baby comes home.   MGM had questions about circumcision.  CSW explained that Medicaid does not cover this procedure, whichMGM seemed aware of.  MGM's understanding is that the hospital will be them and they can pay in installments.  CSW explained that they will need to pay for the procedure in full prior to it being completed.  CSW explained that Dr. Harper/[facility] will complete outpatient circumcisions in some instances, but they will need to  call him prior to baby's discharge to see if he is able and willing, as baby cannot return to the hospital for this after discharge.  CSW provided Dr. Harper's contact information.  Parents stated understanding.   CSW provided education regarding signs and symptoms of perinatal mood disorders and the importance of talking with a medical professional if concerns arise.  MOB reports feeling fine emotionally throughout her pregnancy, but more irritable at the end.  She states no hx of mental illness or current concerns at this time.  Family was engaged and attentive to information given and state they will speak up if symptoms arise.  MGM spoke of a family member who is currently experiencing PPD and has been put on medication.   Parents report that they have their own transportation to visit baby in NICU once MOB is discharged today.  CSW offered a gas card as they will be traveling from [place].  CSW asked MOB about her decision to deliver at [facility] rather than [facility] and she states [facility] has a better reputation and she did not feel comfortable delivering at [facility].  MGM states significant dissatisfaction in her inability to see baby, but ultimately stated understanding of our cold and flu season policy.   CSW explained ongoing support services offered by NICU CSW and gave contact information.    CSW Plan/Description:  Patient/Family Education , Information/Referral to Community Resources , Psychosocial Support and Ongoing Assessment of Needs    Yoshiharu Brassell Minela, LCSW 07/09/2015, 11:26 AM 

## 2015-07-09 NOTE — Discharge Summary (Signed)
OB Discharge Summary     Patient Name: Yvonne Hood DOB: 01/07/1996 MRN: [id number]  Date of admission: 07/06/2015 Delivering MD: Illene Bolus A   Date of discharge: 07/09/2015  Admitting diagnosis: 74 WKS, LABOR Intrauterine pregnancy: [redacted]w[redacted]d     Secondary diagnosis:  Active Problems:   Rubella non-immune status, antepartum   Active labor at term   SVD (spontaneous vaginal delivery)   Gestational hypertension w/o significant proteinuria in 3rd trimester Presumed Intra-amniotic infection  Additional problems: none     Discharge diagnosis: Term Pregnancy Delivered                                                                                                Post partum procedures:none  Augmentation: Pitocin  Complications: Intrauterine Inflammation or infection (Chorioamniotis)  Hospital course:  Onset of Labor With Vaginal Delivery     20 y.o. yo G1P1001 at [redacted]w[redacted]d was admitted in Latent Labor on 07/06/2015. Patient had an complicated labor course as follows: Patient was newly diagnosed with gestational HTN on admission. Her pre-e labs were within normal limits. Patient was asymptomatic and did not require IV Magnesium. Additionally, amnioinfusion was done due to recurrent variable decelerations during labor. Patient was diagnosed with Triple I during labor and was started on Ampicillin and Gentamycin; placenta sent to pathology. These antibiotics were continued for 24 hours postpartum. Patient was afebrile 24 hrs prior to discharge.  Membrane Rupture Time/Date: 2:16 PM ,07/07/2015 ;   Intrapartum Procedures: Episiotomy: None [1]                                         Lacerations:  1st degree [2];Perineal [11]  Patient had a delivery of a Viable infant. Infant to NICU due to respiratory distress after delivery.  07/07/2015  Information for the patient's newborn:  Yvonne, Hood [[id number]]  Delivery Method: Vaginal, Spontaneous Delivery (Filed from Delivery  Summary)     Patient is ambulating, tolerating a regular diet, passing flatus, and urinating well. Patient's blood pressure was consistently below 140/90 for at least 24 hours prior to discharge. Patient is discharged home in stable condition on 07/09/2015.   Physical exam  Filed Vitals:   07/08/15 1757 07/08/15 2133 07/09/15 0154 07/09/15 0607  BP: 131/93 139/89  139/89  Pulse: 77 82  80  Temp: 97.9 F (36.6 C) 97.6 F (36.4 C) 97.4 F (36.3 C) 98.4 F (36.9 C)  TempSrc: Oral Oral  Oral  Resp: Height:      Weight:      SpO2: 99% 99%  99%   General: alert, cooperative and no distress Lochia: appropriate Uterine Fundus: firm Incision: N/A DVT Evaluation: No evidence of DVT seen on physical exam. No cords or calf tenderness. No significant calf/ankle edema. Labs: Lab Results  Component Value Date   WBC 18.8* 07/07/2015   HGB 10.8* 07/07/2015   HCT 33.3* 07/07/2015   MCV 82.8 07/07/2015   PLT 263 07/07/2015   CMP Latest  Ref Rng 07/06/2015  Glucose 65 - 99 mg/dL 75  BUN 6 - 20 mg/dL 10  Creatinine 1.61 - 0.96 mg/dL 0.45  Sodium 409 - 811 mmol/L 137  Potassium 3.5 - 5.1 mmol/L 3.4(L)  Chloride 101 - 111 mmol/L 105  CO2 22 - 32 mmol/L 20(L)  Calcium 8.9 - 10.3 mg/dL 9.5  Total Protein 6.5 - 8.1 g/dL 7.0  Total Bilirubin 0.3 - 1.2 mg/dL 0.5  Alkaline Phos 38 - 126 U/L 183(H)  AST 15 - 41 U/L 19  ALT 14 - 54 U/L 13(L)    Discharge instruction: per After Visit Summary and "Baby and Me Booklet".  After visit meds:    Medication List    TAKE these medications        acetaminophen 325 MG tablet  Commonly known as:  TYLENOL  Take 2 tablets (650 mg total) by mouth every 4 (four) hours as needed for mild pain, moderate pain or headache (for pain scale < 4).     [employer] PRENATAL GUMMY 0.4-113.5 MG Chew  Chew 1 tablet by mouth daily.     senna-docusate 8.6-50 MG tablet  Commonly known as:  Senokot-S  Take 1 tablet by mouth at bedtime.        Diet:  routine diet  Activity: Advance as tolerated. Pelvic rest for 6 weeks.   Outpatient follow up:6 weeks for postpartum visit; nurse visit for blood pressure check in 1 week Follow up Appt:No future appointments. Follow up Visit:No Follow-up on file.  Postpartum contraception: Combination OCPs; this was not prescribed as patient desires to breastfeed. We discussed that since patient desires to breastfeed, combined OCPs are started at 6 week PP. Informed patient to ask for prescription at postpartum visit.  Newborn Data: Live born female  Birth Weight: 7 lb 8.3 oz (3410 g) APGAR: 6, 7  Baby Feeding: Breast Disposition:NICU  07/09/2015 Palma Holter, MD   OB FELLOW DISCHARGE ATTESTATION  I have seen and examined this patient and agree with above documentation in the resident's note.   Silvano Bilis, MD 10:34 AM

## 2015-07-09 NOTE — Lactation Note (Signed)
This note was copied from the chart of Yvonne Hood. Lactation Consultation Note  Follow up visit.  Mom is pumping every 3 hours and obtaining drops.  Discussed milk coming to volume.  She is planning on obtaining a pump from [facility] department.  Informed of [facility] loaner program if needed.  Encouraged to call with concerns prn.  Patient Name: Yvonne Venna Berberich [id number] Date: 07/09/2015     Maternal Data    Feeding Feeding Type: Breast Milk with Formula added Length of feed: 30 min  LATCH Score/Interventions                      Lactation Tools Discussed/Used     Consult Status      Huston Foley 07/09/2015, 9:38 AM

## 2015-07-09 NOTE — Discharge Instructions (Signed)
As we discussed this morning,patients who desire to breastfeed usually start on the combined birth control pill at 6 weeks postpartum (not earlier because it affects breastmilk production). Therefore, I did not prescribe you the birth control pills; make sure you ask for a prescription for this at your postpartum visit. You elected not to start the progesterone only pill (one hormone pill) because you thought you would not be able to take the medication at the same time everyday in order for the medication to be effective.  Vaginal Delivery, Care After Refer to this sheet in the next few weeks. These discharge instructions provide you with information on caring for yourself after delivery. Your health care provider may also give you specific instructions. Your treatment has been planned according to the most current medical practices available, but problems sometimes occur. Call your health care provider if you have any problems or questions after you go home. HOME CARE INSTRUCTIONS  Take over-the-counter or prescription medicines only as directed by your health care provider or pharmacist.  Do not drink alcohol, especially if you are breastfeeding or taking medicine to relieve pain.  Do not chew or smoke tobacco.  Do not use illegal drugs.  Continue to use good perineal care. Good perineal care includes:  Wiping your perineum from front to back.  Keeping your perineum clean.  Do not use tampons or douche until your health care provider says it is okay.  Shower, wash your hair, and take tub baths as directed by your health care provider.  Wear a well-fitting bra that provides breast support.  Eat healthy foods.  Drink enough fluids to keep your urine clear or pale yellow.  Eat high-fiber foods such as whole grain cereals and breads, brown rice, beans, and fresh fruits and vegetables every day. These foods may help prevent or relieve constipation.  Follow your health care provider's  recommendations regarding resumption of activities such as climbing stairs, driving, lifting, exercising, or traveling.  Talk to your health care provider about resuming sexual activities. Resumption of sexual activities is dependent upon your risk of infection, your rate of healing, and your comfort and desire to resume sexual activity.  Try to have someone help you with your household activities and your newborn for at least a few days after you leave the hospital.  Rest as much as possible. Try to rest or take a nap when your newborn is sleeping.  Increase your activities gradually.  Keep all of your scheduled postpartum appointments. It is very important to keep your scheduled follow-up appointments. At these appointments, your health care provider will be checking to make sure that you are healing physically and emotionally. SEEK MEDICAL CARE IF:   You are passing large clots from your vagina. Save any clots to show your health care provider.  You have a foul smelling discharge from your vagina.  You have trouble urinating.  You are urinating frequently.  You have pain when you urinate.  You have a change in your bowel movements.  You have increasing redness, pain, or swelling near your vaginal incision (episiotomy) or vaginal tear.  You have pus draining from your episiotomy or vaginal tear.  Your episiotomy or vaginal tear is separating.  You have painful, hard, or reddened breasts.  You have a severe headache.  You have blurred vision or see spots.  You feel sad or depressed.  You have thoughts of hurting yourself or your newborn.  You have questions about your care, the care of  your newborn, or medicines.  You are dizzy or light-headed.  You have a rash.  You have nausea or vomiting.  You were breastfeeding and have not had a menstrual period within 12 weeks after you stopped breastfeeding.  You are not breastfeeding and have not had a menstrual period by  the 12th week after delivery.  You have a fever. SEEK IMMEDIATE MEDICAL CARE IF:   You have persistent pain.  You have chest pain.  You have shortness of breath.  You faint.  You have leg pain.  You have stomach pain.  Your vaginal bleeding saturates two or more sanitary pads in 1 hour.   This information is not intended to replace advice given to you by your health care provider. Make sure you discuss any questions you have with your health care provider.   Document Released: 05/28/2000 Document Revised: 02/19/2015 Document Reviewed: 01/26/2012 Elsevier Interactive Patient Education [employer]

## 2015-07-09 NOTE — Progress Notes (Signed)
Pt is discharged in the care of Mother,with N.T. Escort. Denies pain or discomfort. Understands all discharged instructions. Questions were asked and answered..  Infant to remain in NICu. Spirits are good.

## 2015-08-26 ENCOUNTER — Ambulatory Visit (INDEPENDENT_AMBULATORY_CARE_PROVIDER_SITE_OTHER): Payer: Medicaid Other | Admitting: Family Medicine

## 2015-08-26 ENCOUNTER — Other Ambulatory Visit (HOSPITAL_COMMUNITY)
Admission: RE | Admit: 2015-08-26 | Discharge: 2015-08-26 | Disposition: A | Discharge status: Home / Self Care | Payer: Medicaid Other | Source: Ambulatory Visit | Attending: Family Medicine | Admitting: Family Medicine

## 2015-08-26 ENCOUNTER — Encounter: Payer: Self-pay | Admitting: Family Medicine

## 2015-08-26 VITALS — BP 113/71 | HR 108 | Resp 18 | Ht <= 58 in | Wt 137.0 lb

## 2015-08-26 DIAGNOSIS — F53 Postpartum depression: Secondary | ICD-10-CM

## 2015-08-26 DIAGNOSIS — O99345 Other mental disorders complicating the puerperium: Secondary | ICD-10-CM

## 2015-08-26 DIAGNOSIS — Z30011 Encounter for initial prescription of contraceptive pills: Secondary | ICD-10-CM

## 2015-08-26 DIAGNOSIS — Z01419 Encounter for gynecological examination (general) (routine) without abnormal findings: Secondary | ICD-10-CM | POA: Insufficient documentation

## 2015-08-26 DIAGNOSIS — Z124 Encounter for screening for malignant neoplasm of cervix: Secondary | ICD-10-CM

## 2015-08-26 MED ORDER — NORETHIN ACE-ETH ESTRAD-FE 1-20 MG-MCG(24) PO TABS: 1.0000 | ORAL_TABLET | Freq: Every day | ORAL | Status: DC

## 2015-08-26 NOTE — Patient Instructions (Signed)
Postpartum Depression and Baby Blues °The postpartum period begins right after the birth of a baby. During this time, there is often a great amount of joy and excitement. It is also a time of many changes in the life of the parents. Regardless of how many times a mother gives birth, each child brings new challenges and dynamics to the family. It is not unusual to have feelings of excitement along with confusing shifts in moods, emotions, and thoughts. All mothers are at risk of developing postpartum depression or the "baby blues." These mood changes can occur right after giving birth, or they may occur many months after giving birth. The baby blues or postpartum depression can be mild or severe. Additionally, postpartum depression can go away rather quickly, or it can be a long-term condition.  °CAUSES °Raised hormone levels and the rapid drop in those levels are thought to be a main cause of postpartum depression and the baby blues. A number of hormones change during and after pregnancy. Estrogen and progesterone usually decrease right after the delivery of your baby. The levels of thyroid hormone and various cortisol steroids also rapidly drop. Other factors that play a role in these mood changes include major life events and genetics.  °RISK FACTORS °If you have any of the following risks for the baby blues or postpartum depression, know what symptoms to watch out for during the postpartum period. Risk factors that may increase the likelihood of getting the baby blues or postpartum depression include: °· Having a personal or family history of depression.   °· Having depression while being pregnant.   °· Having premenstrual mood issues or mood issues related to oral contraceptives. °· Having a lot of life stress.   °· Having marital conflict.   °· Lacking a social support network.   °· Having a baby with special needs.   °· Having health problems, such as diabetes.   °SIGNS AND SYMPTOMS °Symptoms of baby blues  include: °· Brief changes in mood, such as going from extreme happiness to sadness. °· Decreased concentration.   °· Difficulty sleeping.   °· Crying spells, tearfulness.   °· Irritability.   °· Anxiety.   °Symptoms of postpartum depression typically begin within the first month after giving birth. These symptoms include: °· Difficulty sleeping or excessive sleepiness.   °· Marked weight loss.   °· Agitation.   °· Feelings of worthlessness.   °· Lack of interest in activity or food.   °Postpartum psychosis is a very serious condition and can be dangerous. Fortunately, it is rare. Displaying any of the following symptoms is cause for immediate medical attention. Symptoms of postpartum psychosis include:  °· Hallucinations and delusions.   °· Bizarre or disorganized behavior.   °· Confusion or disorientation.   °DIAGNOSIS  °A diagnosis is made by an evaluation of your symptoms. There are no medical or lab tests that lead to a diagnosis, but there are various questionnaires that a health care provider may use to identify those with the baby blues, postpartum depression, or psychosis. Often, a screening tool called the Edinburgh Postnatal Depression Scale is used to diagnose depression in the postpartum period.  °TREATMENT °The baby blues usually goes away on its own in 1-2 weeks. Social support is often all that is needed. You will be encouraged to get adequate sleep and rest. Occasionally, you may be given medicines to help you sleep.  °Postpartum depression requires treatment because it can last several months or longer if it is not treated. Treatment may include individual or group therapy, medicine, or both to address any social, physiological, and psychological   factors that may play a role in the depression. Regular exercise, a healthy diet, rest, and social support may also be strongly recommended.  °Postpartum psychosis is more serious and needs treatment right away. Hospitalization is often needed. °HOME CARE  INSTRUCTIONS °· Get as much rest as you can. Nap when the baby sleeps.   °· Exercise regularly. Some women find yoga and walking to be beneficial.   °· Eat a balanced and nourishing diet.   °· Do little things that you enjoy. Have a cup of tea, take a bubble bath, read your favorite magazine, or listen to your favorite music. °· Avoid alcohol.   °· Ask for help with household chores, cooking, grocery shopping, or running errands as needed. Do not try to do everything.   °· Talk to people close to you about how you are feeling. Get support from your partner, family members, friends, or other new moms. °· Try to stay positive in how you think. Think about the things you are grateful for.   °· Do not spend a lot of time alone.   °· Only take over-the-counter or prescription medicine as directed by your health care provider. °· Keep all your postpartum appointments.   °· Let your health care provider know if you have any concerns.   °SEEK MEDICAL CARE IF: °You are having a reaction to or problems with your medicine. °SEEK IMMEDIATE MEDICAL CARE IF: °· You have suicidal feelings.   °· You think you may harm the baby or someone else. °MAKE SURE YOU: °· Understand these instructions. °· Will watch your condition. °· Will get help right away if you are not doing well or get worse. °  °This information is not intended to replace advice given to you by your health care provider. Make sure you discuss any questions you have with your health care provider. °  °Document Released: 03/04/2004 Document Revised: 06/05/2013 Document Reviewed: 03/12/2013 °Elsevier Interactive Patient Education ©2016 Elsevier Inc. ° °

## 2015-08-26 NOTE — Progress Notes (Signed)
  Subjective:     Yvonne Hood is a 20 y.o. female who presents for a postpartum visit. She is 6 weeks postpartum following a spontaneous vaginal delivery. I have fully reviewed the prenatal and intrapartum course. The delivery was at 39 gestational weeks. Outcome: spontaneous vaginal delivery. Anesthesia: epidural. Postpartum course has been notable for her "nerves are shot". Baby's course has been formula issues. Baby is feeding by bottle - Nutragmagen. Bleeding no bleeding. Bowel function is normal. Bladder function is normal. Patient is not sexually active. Contraception method is none. Postpartum depression screening: positive.  The following portions of the patient's history were reviewed and updated as appropriate: allergies, current medications, past family history, past medical history, past social history, past surgical history and problem list.  Review of Systems Pertinent items noted in HPI and remainder of comprehensive ROS otherwise negative.   Objective:    BP 113/71 mmHg  Pulse 108  Resp 18  Ht 4\' 10"  (1.473 m)  Wt 137 lb (62.143 kg)  BMI 28.64 kg/m2  LMP 08/22/2015  General:  alert, cooperative and appears stated age  Abdomen: soft, non-tender; bowel sounds normal; no masses,  no organomegaly   Vulva:  normal  Vagina: normal vagina, no discharge, exudate, lesion, or erythema  Cervix:  no cervical motion tenderness and no lesions  Corpus: normal size, contour, position, consistency, mobility, non-tender  Adnexa:  normal adnexa        Assessment/Plan:   1. Screening for cervical cancer - Cytology - PAP  2. Encounter for initial prescription of contraceptive pills [Z30.011] May begin COC's - Norethindrone Acetate-Ethinyl Estrad-FE (LOESTRIN 24 FE) 1-20 MG-MCG(24) tablet; Take 1 tablet by mouth daily.  Dispense: 1 Package; Refill: 11  3. Routine postpartum follow-up Normal exam  4. Postpartum depression Does not want medication at this time.  Not  suicidal. Mindfulness. Normal progression of colic discussed. Warning signs discussed.

## 2015-08-27 LAB — CYTOLOGY - PAP

## 2016-04-28 ENCOUNTER — Ambulatory Visit: Payer: Self-pay | Admitting: Family Medicine

## 2016-08-05 ENCOUNTER — Telehealth: Payer: Self-pay | Admitting: *Deleted

## 2016-08-05 DIAGNOSIS — Z30011 Encounter for initial prescription of contraceptive pills: Secondary | ICD-10-CM

## 2016-08-05 MED ORDER — NORETHIN ACE-ETH ESTRAD-FE 1-20 MG-MCG(24) PO TABS: 1.0000 | ORAL_TABLET | Freq: Every day | ORAL | 11 refills | Status: DC

## 2016-08-05 NOTE — Telephone Encounter (Signed)
Pt needed refills on ocp sent to new pharmacy.

## 2017-04-29 ENCOUNTER — Encounter: Payer: Self-pay | Admitting: Physician Assistant

## 2017-06-18 ENCOUNTER — Other Ambulatory Visit: Payer: Self-pay | Admitting: Obstetrics & Gynecology

## 2017-06-18 DIAGNOSIS — Z30011 Encounter for initial prescription of contraceptive pills: Secondary | ICD-10-CM

## 2017-11-26 ENCOUNTER — Other Ambulatory Visit: Payer: Self-pay

## 2017-11-26 ENCOUNTER — Encounter: Payer: Self-pay | Admitting: *Deleted

## 2017-11-26 ENCOUNTER — Emergency Department
Admission: EM | Admit: 2017-11-26 | Discharge: 2017-11-26 | Disposition: A | Discharge status: Home / Self Care | Payer: Medicaid Other | Attending: Emergency Medicine | Admitting: Emergency Medicine

## 2017-11-26 ENCOUNTER — Emergency Department: Payer: Medicaid Other

## 2017-11-26 DIAGNOSIS — W1842XA Slipping, tripping and stumbling without falling due to stepping into hole or opening, initial encounter: Secondary | ICD-10-CM | POA: Insufficient documentation

## 2017-11-26 DIAGNOSIS — Y9289 Other specified places as the place of occurrence of the external cause: Secondary | ICD-10-CM | POA: Diagnosis not present

## 2017-11-26 DIAGNOSIS — Y998 Other external cause status: Secondary | ICD-10-CM | POA: Diagnosis not present

## 2017-11-26 DIAGNOSIS — S99911A Unspecified injury of right ankle, initial encounter: Secondary | ICD-10-CM | POA: Diagnosis present

## 2017-11-26 DIAGNOSIS — Z79899 Other long term (current) drug therapy: Secondary | ICD-10-CM | POA: Insufficient documentation

## 2017-11-26 DIAGNOSIS — S93401A Sprain of unspecified ligament of right ankle, initial encounter: Secondary | ICD-10-CM | POA: Insufficient documentation

## 2017-11-26 DIAGNOSIS — Y9301 Activity, walking, marching and hiking: Secondary | ICD-10-CM | POA: Diagnosis not present

## 2017-11-26 NOTE — ED Notes (Addendum)
Patient states that she stepped off the porch and into a hole and felt her right ankle pop. Patient with complaint of pain to right ankle. Patient with positive pedal pulse.

## 2017-11-26 NOTE — ED Triage Notes (Signed)
Pt reports she stepped off the porch into a hole and felt a pop in her right ankle. C/o right ankle pain, no meds PTA

## 2017-11-26 NOTE — ED Provider Notes (Signed)
[facility] Emergency Department Provider Note  ____________________________________________   First MD Initiated Contact with Patient 11/26/17 2125     (approximate)  I have reviewed the triage vital signs and the nursing notes.   HISTORY  Chief Complaint Ankle Pain    HPI Yvonne Hood is a 22 y.o. female since emergency department complaining of right ankle pain.  She stepped off the porch and stepped into a hole and felt a pop.  She states that she has had pain with weightbearing.  She denies any other injury.  She denies numbness or tingling.  Past Medical History:  Diagnosis Date  . Headache(784.0)   . Hearing loss    Bilateral aids    Patient Active Problem List   Diagnosis Date Noted  . Headache, rebound 02/11/2015  . Rubella non-immune status, antepartum 12/19/2014  . Migraine without aura 04/25/2012    Past Surgical History:  Procedure Laterality Date  . INNER EAR SURGERY     left ear    Prior to Admission medications   Medication Sig Start Date End Date Taking? Authorizing Provider  norethindrone-ethinyl estradiol (JUNEL FE,GILDESS FE,LOESTRIN FE) 1-20 MG-MCG tablet Take 1 tablet by mouth daily. 06/20/17   Allie Bossier, MD    Allergies Bee venom and Ibuprofen  Family History  Problem Relation Age of Onset  . Diabetes Mother   . COPD Mother   . Heart disease Father     Social History Social History   Tobacco Use  . Smoking status: Never Smoker  . Smokeless tobacco: Never Used  Substance Use Topics  . Alcohol use: No  . Drug use: No    Review of Systems  Constitutional: No fever/chills Eyes: No visual changes. ENT: No sore throat. Respiratory: Denies cough Genitourinary: Negative for dysuria. Musculoskeletal: Negative for back pain.  Positive for right ankle pain Skin: Negative for rash.    ____________________________________________   PHYSICAL EXAM:  VITAL SIGNS: ED Triage Vitals  Enc Vitals  Group     BP 11/26/17 2108 122/87     Pulse Rate 11/26/17 2108 99     Resp 11/26/17 2108 20     Temp 11/26/17 2108 99 F (37.2 C)     Temp Source 11/26/17 2108 Oral     SpO2 11/26/17 2108 100 %     Weight 11/26/17 2107 164 lb (74.4 kg)     Height 11/26/17 2107 4\' 11"  (1.499 m)     Head Circumference --      Peak Flow --      Pain Score 11/26/17 2107 9     Pain Loc --      Pain Edu? --      Excl. in GC? --     Constitutional: Alert and oriented. Well appearing and in no acute distress. Eyes: Conjunctivae are normal.  Head: Atraumatic. Nose: No congestion/rhinnorhea. Mouth/Throat: Mucous membranes are moist.   Cardiovascular: Normal rate, regular rhythm. Respiratory: Normal respiratory effort.  No retractions GU: deferred Musculoskeletal: FROM all extremities, warm and well perfused.  Pain is reproduced with dorsiflexion of the foot.  The right foot is not tender along the fifth metatarsal.  The lateral malleolus is tender to palpation.  There is small amount of bruising noted anteriorly to the lateral malleolus.  The Achilles tendon is intact.  She is neurovascularly intact. Neurologic:  Normal speech and language.  Skin:  Skin is warm, dry and intact. No rash noted. Psychiatric: Mood and affect are normal. Speech  and behavior are normal.  ____________________________________________   LABS (all labs ordered are listed, but only abnormal results are displayed)  Labs Reviewed - No data to display ____________________________________________   ____________________________________________  RADIOLOGY  X-ray of the right ankle is negative for any acute fracture  ____________________________________________   PROCEDURES  Procedure(s) performed: Stirrup splint and crutches were applied by nursing staff  Procedures    ____________________________________________   INITIAL IMPRESSION / ASSESSMENT AND PLAN / ED COURSE  Pertinent labs & imaging results that were  available during my care of the patient were reviewed by me and considered in my medical decision making (see chart for details).  Patient is a 22 year old female presents emergency department complaining of right ankle pain.  She states she stepped off the porch and into a hole.  She is complained about pain since the incident.  On physical exam the patient appears well.  The right ankle is tender at the lateral malleolus.  Remainder the exam is unremarkable  X-ray of the right ankle is negative for fracture.  Explained the x-ray results to the patient and her family member.  She was placed in a stirrup splint and given crutches.  She is to take over-the-counter Tylenol for pain as needed.  Elevate and ice the ankle as much as possible.  Return to the emergency department if worsening.  Otherwise follow-up with orthopedics if not better 5 7 days.  She states she understands and will comply with our instructions.  She was discharged in stable condition in the care of her family.     As part of my medical decision making, I reviewed the following data within the electronic MEDICAL RECORD NUMBER Nursing notes reviewed and incorporated, Old chart reviewed, Radiograph reviewed x-ray of the right ankle is negative for fracture, Notes from prior ED visits and [place] Controlled Substance Database  ____________________________________________   FINAL CLINICAL IMPRESSION(S) / ED DIAGNOSES  Final diagnoses:  Sprain of right ankle, unspecified ligament, initial encounter      NEW MEDICATIONS STARTED DURING THIS VISIT:  New Prescriptions   No medications on file     Note:  This document was prepared using Dragon voice recognition software and may include unintentional dictation errors.    Faythe GheeFisher, Susan W, PA-C 11/26/17 2142    Emily FilbertWilliams, Jonathan E, MD 11/26/17 [phone]

## 2017-11-26 NOTE — ED Notes (Signed)
Pt ambulatory to wheel chair upon discharge. Verbalized understanding of discharge instructions, follow-up care and pain management. VSS. Skin warm and dry. A&O x4.

## 2017-11-26 NOTE — Discharge Instructions (Addendum)
Follow-up with your regular doctor or Dr. Hyacinth MeekerMiller if you are not better in 5 to 7 days.  Keep the foot and ankle elevated and iced as much as possible for the next 2 to 3 days.  Use the crutches if it is painful to bear weight.  If you are worsening please return to emergency department

## 2017-12-28 ENCOUNTER — Other Ambulatory Visit: Payer: Self-pay

## 2017-12-28 DIAGNOSIS — K8051 Calculus of bile duct without cholangitis or cholecystitis with obstruction: Secondary | ICD-10-CM | POA: Insufficient documentation

## 2017-12-28 DIAGNOSIS — K8020 Calculus of gallbladder without cholecystitis without obstruction: Secondary | ICD-10-CM | POA: Insufficient documentation

## 2017-12-28 DIAGNOSIS — Z79899 Other long term (current) drug therapy: Secondary | ICD-10-CM | POA: Insufficient documentation

## 2017-12-28 DIAGNOSIS — R1010 Upper abdominal pain, unspecified: Secondary | ICD-10-CM | POA: Diagnosis present

## 2017-12-28 LAB — URINALYSIS, COMPLETE (UACMP) WITH MICROSCOPIC
BACTERIA UA: NONE SEEN
BILIRUBIN URINE: NEGATIVE
GLUCOSE, UA: NEGATIVE mg/dL
HGB URINE DIPSTICK: NEGATIVE
KETONES UR: NEGATIVE mg/dL
Leukocytes, UA: NEGATIVE
NITRITE: NEGATIVE
PROTEIN: NEGATIVE mg/dL
Specific Gravity, Urine: 1.027 (ref 1.005–1.030)
pH: 5 (ref 5.0–8.0)

## 2017-12-28 LAB — COMPREHENSIVE METABOLIC PANEL
ALBUMIN: 3.9 g/dL (ref 3.5–5.0)
ALK PHOS: 95 U/L (ref 38–126)
ALT: 27 U/L (ref 0–44)
ANION GAP: 11 (ref 5–15)
AST: 35 U/L (ref 15–41)
BILIRUBIN TOTAL: 0.4 mg/dL (ref 0.3–1.2)
BUN: 13 mg/dL (ref 6–20)
CALCIUM: 9.7 mg/dL (ref 8.9–10.3)
CO2: 23 mmol/L (ref 22–32)
Chloride: 105 mmol/L (ref 98–111)
Creatinine, Ser: 0.93 mg/dL (ref 0.44–1.00)
GFR calc Af Amer: >60 mL/min (ref >60)
GFR calc non Af Amer: >60 mL/min (ref >60)
GLUCOSE: 92 mg/dL (ref 70–99)
POTASSIUM: 3.7 mmol/L (ref 3.5–5.1)
SODIUM: 139 mmol/L (ref 135–145)
TOTAL PROTEIN: 7.7 g/dL (ref 6.5–8.1)

## 2017-12-28 LAB — CBC
HCT: 45.1 % (ref 35.0–47.0)
HEMOGLOBIN: 15.2 g/dL (ref 12.0–16.0)
MCH: 28.7 pg (ref 26.0–34.0)
MCHC: 33.8 g/dL (ref 32.0–36.0)
MCV: 85 fL (ref 80.0–100.0)
Platelets: 468 10*3/uL — ABNORMAL HIGH (ref 150–440)
RBC: 5.3 MIL/uL — AB (ref 3.80–5.20)
RDW: 13.6 % (ref 11.5–14.5)
WBC: 15.3 10*3/uL — ABNORMAL HIGH (ref 3.6–11.0)

## 2017-12-28 LAB — LIPASE, BLOOD: LIPASE: 29 U/L (ref 11–51)

## 2017-12-28 LAB — POCT PREGNANCY, URINE: PREG TEST UR: NEGATIVE

## 2017-12-28 MED ORDER — ONDANSETRON HCL 4 MG/2ML IJ SOLN
INTRAMUSCULAR | Status: AC
  Filled 2017-12-28: qty 2

## 2017-12-28 MED ORDER — ONDANSETRON HCL 4 MG/2ML IJ SOLN
4.0000 mg | Freq: Once | INTRAMUSCULAR | Status: AC
  Administered 2017-12-28: 4 mg via INTRAVENOUS

## 2017-12-28 NOTE — ED Triage Notes (Signed)
Pt to ED reporting intermittent abd pain since October. Pt reports the pain has never been this bad but she has been seen for it before and was never given answers. Pt started vomiting while in triage and reports this was the first episode of vomiting. Pt reported the pain always worsens after eating. No diarrhea. No fevers. Pt is tearful in triage and hunched over rolling on triage bench.

## 2017-12-29 ENCOUNTER — Emergency Department
Admission: EM | Admit: 2017-12-29 | Discharge: 2017-12-29 | Disposition: A | Discharge status: Home / Self Care | Payer: Medicaid Other | Attending: Emergency Medicine | Admitting: Emergency Medicine

## 2017-12-29 ENCOUNTER — Emergency Department: Payer: Medicaid Other

## 2017-12-29 DIAGNOSIS — R1010 Upper abdominal pain, unspecified: Secondary | ICD-10-CM

## 2017-12-29 DIAGNOSIS — K8050 Calculus of bile duct without cholangitis or cholecystitis without obstruction: Secondary | ICD-10-CM

## 2017-12-29 DIAGNOSIS — K8020 Calculus of gallbladder without cholecystitis without obstruction: Secondary | ICD-10-CM

## 2017-12-29 MED ORDER — FAMOTIDINE IN NACL 20-0.9 MG/50ML-% IV SOLN
20.0000 mg | Freq: Once | INTRAVENOUS | Status: AC
  Administered 2017-12-29: 20 mg via INTRAVENOUS
  Filled 2017-12-29: qty 50

## 2017-12-29 MED ORDER — ONDANSETRON 4 MG PO TBDP: 4.0000 mg | ORAL_TABLET | Freq: Three times a day (TID) | ORAL | 0 refills | Status: DC | PRN

## 2017-12-29 MED ORDER — FENTANYL CITRATE (PF) 100 MCG/2ML IJ SOLN
50.0000 ug | Freq: Once | INTRAMUSCULAR | Status: AC
  Administered 2017-12-29: 50 ug via INTRAVENOUS
  Filled 2017-12-29: qty 2

## 2017-12-29 MED ORDER — ONDANSETRON HCL 4 MG/2ML IJ SOLN
4.0000 mg | Freq: Once | INTRAMUSCULAR | Status: AC
  Administered 2017-12-29: 4 mg via INTRAVENOUS
  Filled 2017-12-29: qty 2

## 2017-12-29 MED ORDER — OXYCODONE-ACETAMINOPHEN 5-325 MG PO TABS: 1.0000 | ORAL_TABLET | ORAL | 0 refills | Status: DC | PRN

## 2017-12-29 MED ORDER — SODIUM CHLORIDE 0.9 % IV BOLUS
1000.0000 mL | Freq: Once | INTRAVENOUS | Status: AC
  Administered 2017-12-29: 1000 mL via INTRAVENOUS

## 2017-12-29 NOTE — Discharge Instructions (Addendum)
1. Take medicines as needed for pain & nausea (Percocet/Zofran #30). 2. Clear liquids x 12 hours, then bland diet x 1 week, then slowly advance diet as tolerated. Avoid fatty, greasy, spicy foods and drinks. 3. Return to the ER for worsening symptoms, persistent vomiting, fever, difficulty breathing or other concerns.  

## 2017-12-29 NOTE — ED Provider Notes (Signed)
[facility] Emergency Department Provider Note   ____________________________________________   First MD Initiated Contact with Patient 12/29/17 0142     (approximate)  I have reviewed the triage vital signs and the nursing notes.   HISTORY  Chief Complaint Abdominal Pain    HPI Yvonne Hood is a 22 y.o. female who presents to the ED from home with a chief complaint of upper abdominal pain.  Patient reports intermittent upper abdominal pain since October, worsened with food.  Has not had any evaluation for this.  Patient was eating homemade soup tonight when she began to have pain.  Symptoms associated with nausea only.  Patient vomited in triage.  Denies associated fever, chills, chest pain, shortness of breath, dysuria, diarrhea.  Denies recent travel or trauma.   Past Medical History:  Diagnosis Date  . Headache(784.0)   . Hearing loss    Bilateral aids    Patient Active Problem List   Diagnosis Date Noted  . Headache, rebound 02/11/2015  . Rubella non-immune status, antepartum 12/19/2014  . Migraine without aura 04/25/2012    Past Surgical History:  Procedure Laterality Date  . INNER EAR SURGERY     left ear    Prior to Admission medications   Medication Sig Start Date End Date Taking? Authorizing Provider  norethindrone-ethinyl estradiol (JUNEL FE,GILDESS FE,LOESTRIN FE) 1-20 MG-MCG tablet Take 1 tablet by mouth daily. 06/20/17   Allie Bossierove, Myra C, MD    Allergies Bee venom and Ibuprofen  Family History  Problem Relation Age of Onset  . Diabetes Mother   . COPD Mother   . Heart disease Father     Social History Social History   Tobacco Use  . Smoking status: Never Smoker  . Smokeless tobacco: Never Used  Substance Use Topics  . Alcohol use: No  . Drug use: No    Review of Systems  Constitutional: No fever/chills Eyes: No visual changes. ENT: No sore throat. Cardiovascular: Denies chest pain. Respiratory: Denies  shortness of breath. Gastrointestinal: Positive for upper abdominal pain and nausea, no vomiting.  No diarrhea.  No constipation. Genitourinary: Negative for dysuria. Musculoskeletal: Negative for back pain. Skin: Negative for rash. Neurological: Negative for headaches, focal weakness or numbness.   ____________________________________________   PHYSICAL EXAM:  VITAL SIGNS: ED Triage Vitals  Enc Vitals Group     BP 12/28/17 2218 (!) 130/94     Pulse Rate 12/28/17 2218 97     Resp 12/28/17 2218 19     Temp 12/28/17 2218 98.3 F (36.8 C)     Temp Source 12/28/17 2218 Oral     SpO2 12/28/17 2218 100 %     Weight 12/28/17 2228 165 lb (74.8 kg)     Height 12/28/17 2228 4\' 10"  (1.473 m)     Head Circumference --      Peak Flow --      Pain Score 12/28/17 2227 10     Pain Loc --      Pain Edu? --      Excl. in GC? --     Constitutional: Alert and oriented. Well appearing and in no acute distress. Eyes: Conjunctivae are normal. PERRL. EOMI. Head: Atraumatic. Nose: No congestion/rhinnorhea. Mouth/Throat: Mucous membranes are moist.  Oropharynx non-erythematous. Neck: No stridor.   Cardiovascular: Normal rate, regular rhythm. Grossly normal heart sounds.  Good peripheral circulation. Respiratory: Normal respiratory effort.  No retractions. Lungs CTAB. Gastrointestinal: Soft and mildly tender to palpation epigastrium without rebound or guarding.  No distention. No abdominal bruits. No CVA tenderness. Musculoskeletal: No lower extremity tenderness nor edema.  No joint effusions. Neurologic:  Normal speech and language. No gross focal neurologic deficits are appreciated. No gait instability. Skin:  Skin is warm, dry and intact. No rash noted. Psychiatric: Mood and affect are normal. Speech and behavior are normal.  ____________________________________________   LABS (all labs ordered are listed, but only abnormal results are displayed)  Labs Reviewed  CBC - Abnormal; Notable  for the following components:      Result Value   WBC 15.3 (*)    RBC 5.30 (*)    Platelets 468 (*)    All other components within normal limits  URINALYSIS, COMPLETE (UACMP) WITH MICROSCOPIC - Abnormal; Notable for the following components:   Color, Urine YELLOW (*)    APPearance CLEAR (*)    All other components within normal limits  LIPASE, BLOOD  COMPREHENSIVE METABOLIC PANEL  POCT PREGNANCY, URINE  POC URINE PREG, ED   ____________________________________________  EKG  None ____________________________________________  RADIOLOGY  ED MD interpretation: Cholelithiasis  Official radiology report(s): US Abdomen Limited Ruq  Result Date: 12/29/2017 CLINICAL DATA:  Upper abdominal pain after eating. Nausea and vomiting today. EXAM: ULTRASOUND ABDOMEN LIMITED RIGHT UPPER QUADRANT COMPARISON:  None. FINDINGS: Gallbladder: A wall-echo-shadow complex emanating from the expected location of the gallbladder is noted compatible with numerous gallstones. No wall thickening is identified with single wall thickness of 3 mm which is top-normal. The largest calculus is estimated at 4.2 mm. No sonographic Murphy sign noted by sonographer. Common bile duct: Diameter: 3.2 mm Liver: No focal lesion identified. Mild increase in echogenicity of the liver is noted. Portal vein is patent on color Doppler imaging with normal direction of blood flow towards the liver. IMPRESSION: Uncomplicated cholelithiasis. Mild hepatic steatosis. Electronically Signed   By: Tollie Eth M.D.   On: 12/29/2017 03:21    ____________________________________________   PROCEDURES  Procedure(s) performed: None  Procedures  Critical Care performed: No  ____________________________________________   INITIAL IMPRESSION / ASSESSMENT AND PLAN / ED COURSE  As part of my medical decision making, I reviewed the following data within the electronic MEDICAL RECORD NUMBER History obtained from family, Nursing notes reviewed and  incorporated, Labs reviewed, Old chart reviewed and Notes from prior ED visits   22 year old female who presents with upper abdominal pain worsened after eating. Differential diagnosis includes, but is not limited to, biliary disease (biliary colic, acute cholecystitis, cholangitis, choledocholithiasis, etc), intrathoracic causes for epigastric abdominal pain including ACS, gastritis, duodenitis, pancreatitis, small bowel or large bowel obstruction, abdominal aortic aneurysm, hernia, and ulcer(s).  Laboratory results remarkable for moderate leukocytosis with normal LFTs and lipase.  Will administer 50 mcg IV fentanyl for pain, 4 mg IV Zofran for nausea, Pepcid 20 mg IV for GERD symptoms.  We will proceed with right upper quadrant abdominal ultrasound to evaluate for cholecystitis.   Clinical Course as of Dec 29 349  Thu Dec 29, 2017  1610 Patient feeling much better.  Updated patient and family member of ultrasound results.  Will discharge home on Percocet and Zofran as needed for pain and nausea, and dietary restrictions.  Encourage follow-up with surgeon.  Strict return precautions given.  Both verbalize understanding and agree with plan of care.   [JS]    Clinical Course User Index [JS] Irean Hong, MD     ____________________________________________   FINAL CLINICAL IMPRESSION(S) / ED DIAGNOSES  Final diagnoses:  Pain of upper abdomen  Calculus  of gallbladder without cholecystitis without obstruction  Biliary colic     ED Discharge Orders    None       Note:  This document was prepared using Dragon voice recognition software and may include unintentional dictation errors.    Irean Hong, MD 12/29/17 [phone]

## 2017-12-29 NOTE — ED Notes (Signed)
Patient transported to Ultrasound 

## 2017-12-29 NOTE — ED Notes (Signed)
Pt & family member sitting in subwait resting quietly with no distress noted; pt updated on wait time and given warm blanket; pt voices understanding

## 2018-01-03 ENCOUNTER — Other Ambulatory Visit: Payer: Self-pay

## 2018-01-03 DIAGNOSIS — K8020 Calculus of gallbladder without cholecystitis without obstruction: Principal | ICD-10-CM | POA: Diagnosis present

## 2018-01-03 LAB — URINALYSIS, COMPLETE (UACMP) WITH MICROSCOPIC
GLUCOSE, UA: NEGATIVE mg/dL
Hgb urine dipstick: NEGATIVE
Ketones, ur: 5 mg/dL — AB
Leukocytes, UA: NEGATIVE
Nitrite: NEGATIVE
PH: 6 (ref 5.0–8.0)
Protein, ur: 30 mg/dL — AB
SPECIFIC GRAVITY, URINE: 1.034 — AB (ref 1.005–1.030)

## 2018-01-03 LAB — CBC
HCT: 42.4 % (ref 35.0–47.0)
Hemoglobin: 14.7 g/dL (ref 12.0–16.0)
MCH: 29.9 pg (ref 26.0–34.0)
MCHC: 34.7 g/dL (ref 32.0–36.0)
MCV: 86.1 fL (ref 80.0–100.0)
PLATELETS: 457 10*3/uL — AB (ref 150–440)
RBC: 4.92 MIL/uL (ref 3.80–5.20)
RDW: 13.5 % (ref 11.5–14.5)
WBC: 12.4 10*3/uL — ABNORMAL HIGH (ref 3.6–11.0)

## 2018-01-03 LAB — COMPREHENSIVE METABOLIC PANEL
ALT: 417 U/L — AB (ref 0–44)
AST: 215 U/L — AB (ref 15–41)
Albumin: 3.9 g/dL (ref 3.5–5.0)
Alkaline Phosphatase: 501 U/L — ABNORMAL HIGH (ref 38–126)
Anion gap: 7 (ref 5–15)
BILIRUBIN TOTAL: 1.3 mg/dL — AB (ref 0.3–1.2)
BUN: 13 mg/dL (ref 6–20)
CALCIUM: 9.6 mg/dL (ref 8.9–10.3)
CO2: 29 mmol/L (ref 22–32)
CREATININE: 0.6 mg/dL (ref 0.44–1.00)
Chloride: 104 mmol/L (ref 98–111)
Glucose, Bld: 101 mg/dL — ABNORMAL HIGH (ref 70–99)
Potassium: 3.9 mmol/L (ref 3.5–5.1)
Sodium: 140 mmol/L (ref 135–145)
TOTAL PROTEIN: 7.7 g/dL (ref 6.5–8.1)

## 2018-01-03 LAB — LIPASE, BLOOD: LIPASE: 32 U/L (ref 11–51)

## 2018-01-03 LAB — POCT PREGNANCY, URINE: Preg Test, Ur: NEGATIVE

## 2018-01-03 NOTE — ED Triage Notes (Signed)
Pt I with co RUQ pain since October seen here recently and dx with gallstones. States waiting to see surgeon but told to come back for worsening pain.

## 2018-01-04 ENCOUNTER — Encounter: Payer: Self-pay | Admitting: Internal Medicine

## 2018-01-04 ENCOUNTER — Inpatient Hospital Stay: Payer: Medicaid Other | Admitting: Anesthesiology

## 2018-01-04 ENCOUNTER — Inpatient Hospital Stay
Admission: EM | Admit: 2018-01-04 | Discharge: 2018-01-05 | DRG: 419 | Disposition: A | Discharge status: Home / Self Care | Payer: Medicaid Other | Attending: Internal Medicine | Admitting: Internal Medicine

## 2018-01-04 ENCOUNTER — Emergency Department: Payer: Medicaid Other

## 2018-01-04 ENCOUNTER — Encounter
Admission: EM | Disposition: A | Discharge status: Home / Self Care | Payer: Self-pay | Source: Home / Self Care | Attending: Internal Medicine

## 2018-01-04 ENCOUNTER — Inpatient Hospital Stay: Payer: Medicaid Other

## 2018-01-04 DIAGNOSIS — R748 Abnormal levels of other serum enzymes: Secondary | ICD-10-CM

## 2018-01-04 DIAGNOSIS — K8020 Calculus of gallbladder without cholecystitis without obstruction: Secondary | ICD-10-CM

## 2018-01-04 DIAGNOSIS — R1011 Right upper quadrant pain: Secondary | ICD-10-CM | POA: Diagnosis present

## 2018-01-04 DIAGNOSIS — R945 Abnormal results of liver function studies: Secondary | ICD-10-CM

## 2018-01-04 HISTORY — DX: Calculus of gallbladder without cholecystitis without obstruction: K80.20

## 2018-01-04 HISTORY — PX: CHOLECYSTECTOMY: SHX55

## 2018-01-04 LAB — HEPATIC FUNCTION PANEL
ALT: 387 U/L — ABNORMAL HIGH (ref 0–44)
AST: 222 U/L — ABNORMAL HIGH (ref 15–41)
Albumin: 3.7 g/dL (ref 3.5–5.0)
Alkaline Phosphatase: 435 U/L — ABNORMAL HIGH (ref 38–126)
BILIRUBIN INDIRECT: 0.7 mg/dL (ref 0.3–0.9)
Bilirubin, Direct: 0.2 mg/dL (ref 0.0–0.2)
Total Bilirubin: 0.9 mg/dL (ref 0.3–1.2)
Total Protein: 7.7 g/dL (ref 6.5–8.1)

## 2018-01-04 SURGERY — LAPAROSCOPIC CHOLECYSTECTOMY
Anesthesia: General | Site: Abdomen | Wound class: Clean Contaminated

## 2018-01-04 MED ORDER — MIDAZOLAM HCL 2 MG/2ML IJ SOLN
INTRAMUSCULAR | Status: AC
  Filled 2018-01-04: qty 2

## 2018-01-04 MED ORDER — ACETAMINOPHEN 10 MG/ML IV SOLN
INTRAVENOUS | Status: AC
Start: 2018-01-04 — End: ?
  Filled 2018-01-04: qty 100

## 2018-01-04 MED ORDER — SODIUM CHLORIDE 0.9% FLUSH
10.0000 mL | Freq: Three times a day (TID) | INTRAVENOUS | Status: DC
  Administered 2018-01-04 (×2): 10 mL via INTRAVENOUS

## 2018-01-04 MED ORDER — ONDANSETRON HCL 4 MG/2ML IJ SOLN
INTRAMUSCULAR | Status: AC
  Filled 2018-01-04: qty 2

## 2018-01-04 MED ORDER — SENNOSIDES-DOCUSATE SODIUM 8.6-50 MG PO TABS: 1.0000 | ORAL_TABLET | Freq: Every evening | ORAL | Status: DC | PRN

## 2018-01-04 MED ORDER — FENTANYL CITRATE (PF) 100 MCG/2ML IJ SOLN
INTRAMUSCULAR | Status: AC
Start: 2018-01-04 — End: 2018-01-04
  Administered 2018-01-04: 50 ug via INTRAVENOUS
  Filled 2018-01-04: qty 2

## 2018-01-04 MED ORDER — SUGAMMADEX SODIUM 200 MG/2ML IV SOLN
INTRAVENOUS | Status: DC | PRN
  Administered 2018-01-04: 150 mg via INTRAVENOUS

## 2018-01-04 MED ORDER — BUPIVACAINE-EPINEPHRINE (PF) 0.5% -1:200000 IJ SOLN
INTRAMUSCULAR | Status: DC | PRN
  Administered 2018-01-04: 18 mL via PERINEURAL

## 2018-01-04 MED ORDER — ONDANSETRON HCL 4 MG/2ML IJ SOLN
4.0000 mg | Freq: Once | INTRAMUSCULAR | Status: AC
  Administered 2018-01-04: 4 mg via INTRAVENOUS
  Filled 2018-01-04: qty 2

## 2018-01-04 MED ORDER — LACTATED RINGERS IV SOLN
INTRAVENOUS | Status: DC | PRN
  Administered 2018-01-04: 19:00:00 via INTRAVENOUS

## 2018-01-04 MED ORDER — MIDAZOLAM HCL 2 MG/2ML IJ SOLN
INTRAMUSCULAR | Status: DC | PRN
  Administered 2018-01-04: 2 mg via INTRAVENOUS

## 2018-01-04 MED ORDER — ONDANSETRON HCL 4 MG/2ML IJ SOLN: 4.0000 mg | Freq: Four times a day (QID) | INTRAMUSCULAR | Status: DC | PRN

## 2018-01-04 MED ORDER — PROMETHAZINE HCL 25 MG/ML IJ SOLN
6.2500 mg | INTRAMUSCULAR | Status: DC | PRN
  Administered 2018-01-04: 12.5 mg via INTRAVENOUS

## 2018-01-04 MED ORDER — ONDANSETRON HCL 4 MG/2ML IJ SOLN
INTRAMUSCULAR | Status: DC | PRN
  Administered 2018-01-04: 4 mg via INTRAVENOUS

## 2018-01-04 MED ORDER — MORPHINE SULFATE (PF) 2 MG/ML IV SOLN: 2.0000 mg | INTRAVENOUS | Status: DC | PRN

## 2018-01-04 MED ORDER — SODIUM CHLORIDE FLUSH 0.9 % IV SOLN
INTRAVENOUS | Status: AC
  Filled 2018-01-04: qty 10

## 2018-01-04 MED ORDER — SUGAMMADEX SODIUM 200 MG/2ML IV SOLN
INTRAVENOUS | Status: AC
  Filled 2018-01-04: qty 2

## 2018-01-04 MED ORDER — OXYCODONE HCL 5 MG PO TABS: 5.0000 mg | ORAL_TABLET | Freq: Once | ORAL | Status: DC | PRN

## 2018-01-04 MED ORDER — MORPHINE SULFATE (PF) 4 MG/ML IV SOLN
4.0000 mg | Freq: Once | INTRAVENOUS | Status: AC
  Administered 2018-01-04: 4 mg via INTRAVENOUS
  Filled 2018-01-04: qty 1

## 2018-01-04 MED ORDER — PROPOFOL 10 MG/ML IV BOLUS
INTRAVENOUS | Status: AC
  Filled 2018-01-04: qty 20

## 2018-01-04 MED ORDER — DEXAMETHASONE SODIUM PHOSPHATE 10 MG/ML IJ SOLN
INTRAMUSCULAR | Status: DC | PRN
  Administered 2018-01-04: 10 mg via INTRAVENOUS

## 2018-01-04 MED ORDER — CEFAZOLIN SODIUM-DEXTROSE 2-4 GM/100ML-% IV SOLN
2.0000 g | INTRAVENOUS | Status: DC
  Filled 2018-01-04: qty 100

## 2018-01-04 MED ORDER — SUCCINYLCHOLINE CHLORIDE 20 MG/ML IJ SOLN
INTRAMUSCULAR | Status: DC | PRN
  Administered 2018-01-04: 100 mg via INTRAVENOUS

## 2018-01-04 MED ORDER — FENTANYL CITRATE (PF) 100 MCG/2ML IJ SOLN
25.0000 ug | INTRAMUSCULAR | Status: DC | PRN
  Administered 2018-01-04 (×2): 50 ug via INTRAVENOUS

## 2018-01-04 MED ORDER — SODIUM CHLORIDE 0.9 % IV SOLN
INTRAVENOUS | Status: DC
  Administered 2018-01-04: 21:00:00 via INTRAVENOUS

## 2018-01-04 MED ORDER — MORPHINE SULFATE (PF) 4 MG/ML IV SOLN
4.0000 mg | INTRAVENOUS | Status: DC | PRN
  Administered 2018-01-05 (×2): 4 mg via INTRAVENOUS
  Filled 2018-01-04 (×2): qty 1

## 2018-01-04 MED ORDER — BISACODYL 5 MG PO TBEC: 5.0000 mg | DELAYED_RELEASE_TABLET | Freq: Every day | ORAL | Status: DC | PRN

## 2018-01-04 MED ORDER — MEPERIDINE HCL 50 MG/ML IJ SOLN: 6.2500 mg | INTRAMUSCULAR | Status: DC | PRN

## 2018-01-04 MED ORDER — ACETAMINOPHEN 10 MG/ML IV SOLN
INTRAVENOUS | Status: DC | PRN
  Administered 2018-01-04: 1000 mg via INTRAVENOUS

## 2018-01-04 MED ORDER — LIDOCAINE HCL (CARDIAC) PF 100 MG/5ML IV SOSY
PREFILLED_SYRINGE | INTRAVENOUS | Status: DC | PRN
  Administered 2018-01-04: 100 mg via INTRAVENOUS

## 2018-01-04 MED ORDER — PROPOFOL 10 MG/ML IV BOLUS
INTRAVENOUS | Status: DC | PRN
  Administered 2018-01-04: 20 mg via INTRAVENOUS
  Administered 2018-01-04: 160 mg via INTRAVENOUS
  Administered 2018-01-04: 20 mg via INTRAVENOUS

## 2018-01-04 MED ORDER — SODIUM CHLORIDE 0.9 % IV BOLUS
1000.0000 mL | Freq: Once | INTRAVENOUS | Status: AC
  Administered 2018-01-04: 1000 mL via INTRAVENOUS

## 2018-01-04 MED ORDER — FENTANYL CITRATE (PF) 100 MCG/2ML IJ SOLN
INTRAMUSCULAR | Status: DC | PRN
  Administered 2018-01-04 (×2): 50 ug via INTRAVENOUS
  Administered 2018-01-04: 25 ug via INTRAVENOUS
  Administered 2018-01-04: 50 ug via INTRAVENOUS

## 2018-01-04 MED ORDER — PROMETHAZINE HCL 25 MG/ML IJ SOLN
INTRAMUSCULAR | Status: AC
  Administered 2018-01-04: 12.5 mg via INTRAVENOUS
  Filled 2018-01-04: qty 1

## 2018-01-04 MED ORDER — ENOXAPARIN SODIUM 40 MG/0.4ML ~~LOC~~ SOLN: 40.0000 mg | SUBCUTANEOUS | Status: DC

## 2018-01-04 MED ORDER — FENTANYL CITRATE (PF) 250 MCG/5ML IJ SOLN
INTRAMUSCULAR | Status: AC
  Filled 2018-01-04: qty 5

## 2018-01-04 MED ORDER — OXYCODONE HCL 5 MG/5ML PO SOLN: 5.0000 mg | Freq: Once | ORAL | Status: DC | PRN

## 2018-01-04 MED ORDER — SODIUM CHLORIDE 0.9 % IV SOLN
INTRAVENOUS | Status: AC | PRN
  Administered 2018-01-04: 1000 mL

## 2018-01-04 MED ORDER — ONDANSETRON HCL 4 MG PO TABS: 4.0000 mg | ORAL_TABLET | Freq: Four times a day (QID) | ORAL | Status: DC | PRN

## 2018-01-04 SURGICAL SUPPLY — 42 items
APPLIER CLIP 5 13 M/L LIGAMAX5 (MISCELLANEOUS) ×3
BLADE SURG SZ11 CARB STEEL (BLADE) ×3 IMPLANT
CANISTER SUCT 1200ML W/VALVE (MISCELLANEOUS) ×6 IMPLANT
CHLORAPREP W/TINT 26ML (MISCELLANEOUS) ×3 IMPLANT
CLIP APPLIE 5 13 M/L LIGAMAX5 (MISCELLANEOUS) ×1 IMPLANT
DERMABOND ADVANCED (GAUZE/BANDAGES/DRESSINGS) ×2
DERMABOND ADVANCED .7 DNX12 (GAUZE/BANDAGES/DRESSINGS) ×1 IMPLANT
DRSG TEGADERM 4X4.75 (GAUZE/BANDAGES/DRESSINGS) ×3 IMPLANT
ELECT REM PT RETURN 9FT ADLT (ELECTROSURGICAL) ×3
ELECTRODE REM PT RTRN 9FT ADLT (ELECTROSURGICAL) ×1 IMPLANT
ENDOLOOP SUT PDS II  0 18 (SUTURE) ×2
ENDOLOOP SUT PDS II 0 18 (SUTURE) ×1 IMPLANT
GAUZE SPONGE 4X4 12PLY STRL (GAUZE/BANDAGES/DRESSINGS) ×3 IMPLANT
GLOVE BIO SURGEON STRL SZ 6.5 (GLOVE) ×8 IMPLANT
GLOVE BIO SURGEONS STRL SZ 6.5 (GLOVE) ×4
GLOVE BIOGEL PI IND STRL 7.5 (GLOVE) ×4 IMPLANT
GLOVE BIOGEL PI INDICATOR 7.5 (GLOVE) ×8
GOWN STRL REUS W/ TWL LRG LVL3 (GOWN DISPOSABLE) ×4 IMPLANT
GOWN STRL REUS W/TWL LRG LVL3 (GOWN DISPOSABLE) ×8
GRASPER SUT TROCAR 14GX15 (MISCELLANEOUS) IMPLANT
HEMOSTAT SURGICEL 2X3 (HEMOSTASIS) IMPLANT
IRRIGATION STRYKERFLOW (MISCELLANEOUS) ×1 IMPLANT
IRRIGATOR STRYKERFLOW (MISCELLANEOUS) ×3
IV NS 1000ML (IV SOLUTION) ×4
IV NS 1000ML BAXH (IV SOLUTION) ×2 IMPLANT
KIT TURNOVER KIT A (KITS) ×3 IMPLANT
L-HOOK LAP DISP 36CM (ELECTROSURGICAL) ×3
LABEL OR SOLS (LABEL) ×3 IMPLANT
LHOOK LAP DISP 36CM (ELECTROSURGICAL) ×1 IMPLANT
NEEDLE HYPO 25X1 1.5 SAFETY (NEEDLE) ×3 IMPLANT
NEEDLE INSUFFLATION 14GA 120MM (NEEDLE) ×3 IMPLANT
NS IRRIG 500ML POUR BTL (IV SOLUTION) ×3 IMPLANT
PACK LAP CHOLECYSTECTOMY (MISCELLANEOUS) ×3 IMPLANT
POUCH SPECIMEN RETRIEVAL 10MM (ENDOMECHANICALS) ×3 IMPLANT
SCISSORS METZENBAUM CVD 33 (INSTRUMENTS) ×3 IMPLANT
SLEEVE ENDOPATH XCEL 5M (ENDOMECHANICALS) ×9 IMPLANT
SUT MNCRL AB 4-0 PS2 18 (SUTURE) ×6 IMPLANT
SUT VIC AB 0 CT1 36 (SUTURE) IMPLANT
SUT VICRYL 0 AB UR-6 (SUTURE) ×3 IMPLANT
TROCAR XCEL NON-BLD 11X100MML (ENDOMECHANICALS) ×3 IMPLANT
TROCAR XCEL NON-BLD 5MMX100MML (ENDOMECHANICALS) ×3 IMPLANT
TUBING INSUFFLATION (TUBING) ×3 IMPLANT

## 2018-01-04 NOTE — Anesthesia Procedure Notes (Signed)
Procedure Name: Intubation Date/Time: 01/04/2018 7:50 PM Performed by: Waldo LaineJustis, Myelle Poteat, CRNA Pre-anesthesia Checklist: Patient identified, Emergency Drugs available, Suction available and Patient being monitored Patient Re-evaluated:Patient Re-evaluated prior to induction Oxygen Delivery Method: Circle system utilized Preoxygenation: Pre-oxygenation with 100% oxygen Induction Type: IV induction and Rapid sequence Laryngoscope Size: Miller and 2 Grade View: Grade I Tube type: Oral Tube size: 7.0 mm Number of attempts: 1 Airway Equipment and Method: Stylet Placement Confirmation: ETT inserted through vocal cords under direct vision,  positive ETCO2 and breath sounds checked- equal and bilateral Secured at: 20 cm Tube secured with: Tape Dental Injury: Teeth and Oropharynx as per pre-operative assessment

## 2018-01-04 NOTE — Anesthesia Post-op Follow-up Note (Signed)
Anesthesia QCDR form completed.        

## 2018-01-04 NOTE — ED Provider Notes (Signed)
[facility] Emergency Department Provider Note   ____________________________________________   First MD Initiated Contact with Patient 01/04/18 0136     (approximate)  I have reviewed the triage vital signs and the nursing notes.   HISTORY  Chief Complaint Abdominal Pain    HPI Yvonne Hood is a 22 y.o. female who comes into the hospital today with some abdominal pain.  The patient has a history of gallstones.  She was here last week in pain and she reports that the pain never went away.  She was referred to a physician but she reports that that physician has been on vacation for the last 2 weeks.  The patient has had some nausea and vomiting.  She states though that today whenever she vomits she vomits blood.  She rates her pain a 7 out of 10 in intensity.  She has been taking the pain medicine which was prescribed but she states it is not working.  She denies any fevers.  The patient is here today for evaluation of her symptoms.   Past Medical History:  Diagnosis Date  . Headache(784.0)   . Hearing loss    Bilateral aids    Patient Active Problem List   Diagnosis Date Noted  . Cholelithiasis 01/04/2018  . Headache, rebound 02/11/2015  . Rubella non-immune status, antepartum 12/19/2014  . Migraine without aura 04/25/2012    Past Surgical History:  Procedure Laterality Date  . INNER EAR SURGERY     left ear    Prior to Admission medications   Medication Sig Start Date End Date Taking? Authorizing Provider  norethindrone-ethinyl estradiol (JUNEL FE,GILDESS FE,LOESTRIN FE) 1-20 MG-MCG tablet Take 1 tablet by mouth daily. 06/20/17  Yes Dove, Myra C, MD  ondansetron (ZOFRAN ODT) 4 MG disintegrating tablet Take 1 tablet (4 mg total) by mouth every 8 (eight) hours as needed for nausea or vomiting. 12/29/17  Yes Paulette Blanch, MD  oxyCODONE-acetaminophen (PERCOCET/ROXICET) 5-325 MG tablet Take 1 tablet by mouth every 4 (four) hours as needed for  severe pain. 12/29/17  Yes Paulette Blanch, MD    Allergies Bee venom and Ibuprofen  Family History  Problem Relation Age of Onset  . Diabetes Mother   . COPD Mother   . Heart disease Father     Social History Social History   Tobacco Use  . Smoking status: Never Smoker  . Smokeless tobacco: Never Used  Substance Use Topics  . Alcohol use: No  . Drug use: No    Review of Systems  Constitutional: No fever/chills Eyes: No visual changes. ENT: No sore throat. Cardiovascular: Denies chest pain. Respiratory: Denies shortness of breath. Gastrointestinal:  abdominal pain,  nausea, vomiting.  No diarrhea.  No constipation. Genitourinary: Negative for dysuria. Musculoskeletal: Negative for back pain. Skin: Negative for rash. Neurological: Negative for headaches, focal weakness or numbness.   ____________________________________________   PHYSICAL EXAM:  VITAL SIGNS: ED Triage Vitals  Enc Vitals Group     BP 01/03/18 2049 118/80     Pulse Rate 01/03/18 2049 92     Resp 01/03/18 2049 19     Temp 01/03/18 2049 98.9 F (37.2 C)     Temp src --      SpO2 01/03/18 2049 97 %     Weight 01/03/18 2052 164 lb (74.4 kg)     Height 01/03/18 2052 4' 10"  (1.473 m)     Head Circumference --      Peak Flow --  Pain Score 01/03/18 2052 8     Pain Loc --      Pain Edu? --      Excl. in [place]? --     Constitutional: Alert and oriented. Well appearing and in mild distress. Eyes: Conjunctivae are normal. PERRL. EOMI. Head: Atraumatic. Nose: No congestion/rhinnorhea. Mouth/Throat: Mucous membranes are moist.  Oropharynx non-erythematous. Cardiovascular: Normal rate, regular rhythm. Grossly normal heart sounds.  Good peripheral circulation. Respiratory: Normal respiratory effort.  No retractions. Lungs CTAB. Gastrointestinal: Soft with some right upper quadrant tenderness to palpation . No distention.  Positive bowel sounds Musculoskeletal: No lower extremity tenderness nor edema.    Neurologic:  Normal speech and language.  Skin:  Skin is warm, dry and intact.  Psychiatric: Mood and affect are normal.   ____________________________________________   LABS (all labs ordered are listed, but only abnormal results are displayed)  Labs Reviewed  CBC - Abnormal; Notable for the following components:      Result Value   WBC 12.4 (*)    Platelets 457 (*)    All other components within normal limits  COMPREHENSIVE METABOLIC PANEL - Abnormal; Notable for the following components:   Glucose, Bld 101 (*)    AST 215 (*)    ALT 417 (*)    Alkaline Phosphatase 501 (*)    Total Bilirubin 1.3 (*)    All other components within normal limits  URINALYSIS, COMPLETE (UACMP) WITH MICROSCOPIC - Abnormal; Notable for the following components:   Color, Urine AMBER (*)    APPearance HAZY (*)    Specific Gravity, Urine 1.034 (*)    Bilirubin Urine SMALL (*)    Ketones, ur 5 (*)    Protein, ur 30 (*)    Bacteria, UA RARE (*)    All other components within normal limits  LIPASE, BLOOD  POC URINE PREG, ED  POCT PREGNANCY, URINE   ____________________________________________  EKG  none ____________________________________________  RADIOLOGY  ED MD interpretation:  [place] abd RUQ: Cholelithiasis, no additional changes to suggest cholecystitis, similar appearance to previous study.  Official radiology report(s): US Abdomen Limited Ruq  Result Date: 01/04/2018 CLINICAL DATA:  Intermittent right upper quadrant pain since October. Elevated liver function studies. Known gallstones. EXAM: ULTRASOUND ABDOMEN LIMITED RIGHT UPPER QUADRANT COMPARISON:  12/29/2017 FINDINGS: Gallbladder: Contracted gallbladder. Cholelithiasis with multiple stones in the gallbladder creating wall echo shadow complex. No gallbladder wall thickening. Murphy's sign is negative but this is nonspecific in a medicated patient. Common bile duct: Diameter: 5.3 mm, normal Liver: No focal lesion identified. Within  normal limits in parenchymal echogenicity. Portal vein is patent on color Doppler imaging with normal direction of blood flow towards the liver. IMPRESSION: Cholelithiasis. No additional changes to suggest cholecystitis. Similar appearance to previous study. Electronically Signed   By: Lucienne Capers M.D.   On: 01/04/2018 03:21    ____________________________________________   PROCEDURES  Procedure(s) performed: None  Procedures  Critical Care performed: No  ____________________________________________   INITIAL IMPRESSION / ASSESSMENT AND PLAN / ED COURSE  As part of my medical decision making, I reviewed the following data within the electronic MEDICAL RECORD NUMBER Notes from prior ED visits and [place] Controlled Substance Database   This is a 22 year old female who comes into the hospital today with some abdominal pain.  The patient has been diagnosed with gallstones a week ago.  My differential diagnosis includes cholelithiasis, cholecystitis, choledocholithiasis  We did check some blood work to include a CBC, CMP, lipase and urinalysis.  The patient's  CBC shows a white blood cell count of 12.4.  The patient's liver enzymes are elevated with an AST of 215 and ALT of 417 and alk phos of 501.  We did send the patient for an ultrasound.  I ordered a dose of morphine and Zofran for the patient.  The patient's ultrasound shows continued cholelithiasis but no changes to suggest cholecystitis or any bile duct dilatation.  The patient though does have some elevated liver enzymes which is concerning for possible obstruction.  I will admit the patient to the hospitalist service for ERCP and further evaluation. I did order the patient a liter of normal saline.      ____________________________________________   FINAL CLINICAL IMPRESSION(S) / ED DIAGNOSES  Final diagnoses:  RUQ pain  Calculus of gallbladder without cholecystitis without obstruction  Elevated liver enzymes     ED  Discharge Orders    None       Note:  This document was prepared using Dragon voice recognition software and may include unintentional dictation errors.    Loney Hering, MD 01/04/18 [phone]

## 2018-01-04 NOTE — H&P (Signed)
[employer] [facility] [place] [facility]   PATIENT NAME: Yvonne Hood    MR#:  [id number]  DATE OF BIRTH:  1996-05-05  DATE OF ADMISSION:  01/04/2018  PRIMARY CARE PHYSICIAN: Center, [facility]   REQUESTING/REFERRING PHYSICIAN: Rebecka Apley, MD  CHIEF COMPLAINT:   Chief Complaint  Patient presents with  . Abdominal Pain    HISTORY OF PRESENT ILLNESS:  Yvonne Hood  is a 22 y.o. female with a known history of hearing loss (w/ B/L hearing aids) p/w AP/N/V. Pt was in ED last wk, was diagnosed w/ cholelithiasis w/o cholecystitis. LFTs WNL at that time. Was discharged w/ outpt General Surgery f/up scheduled for Tuesday 01/10/2018. She had recurrent AP/N/V on 01/03/2018. AP localized to RUQ/epigastric area, sharp, crampy, radiating to the back. Endorses decreased PO intake, loss of appetite, PO intolerance. Repeat RUQ U/S (+) cholelithiasis, (-) cholecystitis, (-) CBD dilation. AST 215, ALT 417, APhos 501, TBili 1.3. Pt well-appearing, comfortable, in no acute distress.  PAST MEDICAL HISTORY:   Past Medical History:  Diagnosis Date  . Headache(784.0)   . Hearing loss    Bilateral aids    PAST SURGICAL HISTORY:   Past Surgical History:  Procedure Laterality Date  . INNER EAR SURGERY     left ear    SOCIAL HISTORY:   Social History   Tobacco Use  . Smoking status: Never Smoker  . Smokeless tobacco: Never Used  Substance Use Topics  . Alcohol use: No    FAMILY HISTORY:   Family History  Problem Relation Age of Onset  . Diabetes Mother   . COPD Mother   . Heart disease Father     DRUG ALLERGIES:   Allergies  Allergen Reactions  . Bee Venom Shortness Of Breath  . Ibuprofen Hives, Swelling and Rash    REVIEW OF SYSTEMS:   Review of Systems  Constitutional: Negative for chills, diaphoresis, fever, malaise/fatigue and weight loss.  HENT: Negative for congestion, ear pain, hearing loss, nosebleeds, sinus pain, sore  throat and tinnitus.   Eyes: Negative for blurred vision, double vision and photophobia.  Respiratory: Negative for cough, hemoptysis, sputum production, shortness of breath and wheezing.   Cardiovascular: Negative for chest pain, palpitations, orthopnea, claudication, leg swelling and PND.  Gastrointestinal: Positive for abdominal pain, nausea and vomiting. Negative for blood in stool, constipation, diarrhea, heartburn and melena.  Genitourinary: Negative for dysuria, flank pain, frequency, hematuria and urgency.  Musculoskeletal: Positive for back pain. Negative for falls, joint pain, myalgias and neck pain.  Skin: Negative for itching and rash.  Neurological: Negative for dizziness, tingling, tremors, sensory change, speech change, focal weakness, seizures, loss of consciousness, weakness and headaches.  Psychiatric/Behavioral: Negative for memory loss. The patient does not have insomnia.    MEDICATIONS AT HOME:   Prior to Admission medications   Medication Sig Start Date End Date Taking? Authorizing Provider  norethindrone-ethinyl estradiol (JUNEL FE,GILDESS FE,LOESTRIN FE) 1-20 MG-MCG tablet Take 1 tablet by mouth daily. 06/20/17  Yes Dove, Myra C, MD  ondansetron (ZOFRAN ODT) 4 MG disintegrating tablet Take 1 tablet (4 mg total) by mouth every 8 (eight) hours as needed for nausea or vomiting. 12/29/17  Yes Irean Hong, MD  oxyCODONE-acetaminophen (PERCOCET/ROXICET) 5-325 MG tablet Take 1 tablet by mouth every 4 (four) hours as needed for severe pain. 12/29/17  Yes Irean Hong, MD      VITAL SIGNS:  Blood pressure 119/78, pulse 83, temperature 98.1 F (36.7 C), temperature source  Oral, resp. rate 18, height 4\' 10"  (1.473 m), weight 74.4 kg (164 lb), last menstrual period 12/14/2017, SpO2 98 %, unknown if currently breastfeeding.  PHYSICAL EXAMINATION:  Physical Exam  Constitutional: She is oriented to person, place, and time. She appears well-developed and well-nourished. She is active  and cooperative.  Non-toxic appearance. She does not have a sickly appearance. She does not appear ill. No distress. She is not intubated.  HENT:  Head: Normocephalic and atraumatic.  Mouth/Throat: Oropharynx is clear and moist. No oropharyngeal exudate.  Eyes: Conjunctivae, EOM and lids are normal. No scleral icterus.  Neck: Neck supple. No JVD present. No thyromegaly present.  Cardiovascular: Normal rate, regular rhythm, S1 normal, S2 normal and normal heart sounds.  No extrasystoles are present. Exam reveals no gallop, no S3, no S4, no distant heart sounds and no friction rub.  No murmur heard. Pulmonary/Chest: Effort normal and breath sounds normal. No accessory muscle usage or stridor. No apnea, no tachypnea and no bradypnea. She is not intubated. No respiratory distress. She has no decreased breath sounds. She has no wheezes. She has no rhonchi. She has no rales.  Abdominal: Soft. Normal appearance. She exhibits no distension. Bowel sounds are decreased. There is generalized tenderness and tenderness in the right upper quadrant and epigastric area. There is no rebound and no guarding.  Musculoskeletal: Normal range of motion. She exhibits no edema or tenderness.  Lymphadenopathy:    She has no cervical adenopathy.  Neurological: She is alert and oriented to person, place, and time. She is not disoriented.  (+) B/L hearing aids.  Skin: Skin is warm, dry and intact. No rash noted. She is not diaphoretic. No erythema.  Psychiatric: She has a normal mood and affect. Her speech is normal and behavior is normal. Judgment and thought content normal. Cognition and memory are normal.   LABORATORY PANEL:   CBC Recent Labs  Lab 01/03/18 2055  WBC 12.4*  HGB 14.7  HCT 42.4  PLT 457*   ------------------------------------------------------------------------------------------------------------------  Chemistries  Recent Labs  Lab 01/03/18 2055  NA 140  K 3.9  CL 104  CO2 29  GLUCOSE  101*  BUN 13  CREATININE 0.60  CALCIUM 9.6  AST 215*  ALT 417*  ALKPHOS 501*  BILITOT 1.3*   ------------------------------------------------------------------------------------------------------------------  Cardiac Enzymes No results for input(s): TROPONINI in the last 168 hours. ------------------------------------------------------------------------------------------------------------------  RADIOLOGY:  Mr Abdomen Mrcp Wo Contrast  Result Date: 01/04/2018 CLINICAL DATA:  Abdominal pain with history of gallstones. Nausea and vomiting. Elevated liver function studies. EXAM: MRI ABDOMEN WITHOUT CONTRAST  (INCLUDING MRCP) TECHNIQUE: Multiplanar multisequence MR imaging of the abdomen was performed. Heavily T2-weighted images of the biliary and pancreatic ducts were obtained, and three-dimensional MRCP images were rendered by post processing. COMPARISON:  Ultrasound right upper quadrant 01/04/2018 FINDINGS: Lower chest: No acute findings. Hepatobiliary: Homogeneous liver parenchyma without focal mass lesion. Cholelithiasis with multiple small stones layering in the gallbladder. No gallbladder wall thickening or inflammatory changes. Stones are demonstrated in the cystic duct. There is no intra or extrahepatic bile duct dilatation. No filling defects in the bile ducts. Pancreas: No mass, inflammatory changes, or other parenchymal abnormality identified. Spleen:  Within normal limits in size and appearance. Adrenals/Urinary Tract: No masses identified. No evidence of hydronephrosis. Stomach/Bowel: Visualized portions within the abdomen are unremarkable. Vascular/Lymphatic: No pathologically enlarged lymph nodes identified. No abdominal aortic aneurysm demonstrated. Other:  No free air or free fluid indicated in the abdomen. Musculoskeletal: No suspicious bone lesions identified. IMPRESSION:  1. Cholelithiasis with multiple stones in the gallbladder and cystic duct. No inflammatory changes to suggest  cholecystitis. 2. Normal caliber intra and extrahepatic bile ducts. No choledocholithiasis. Electronically Signed   By: Burman Nieves M.D.   On: 01/04/2018 06:01   Mr 3d Recon At Scanner  Result Date: 01/04/2018 CLINICAL DATA:  Abdominal pain with history of gallstones. Nausea and vomiting. Elevated liver function studies. EXAM: MRI ABDOMEN WITHOUT CONTRAST  (INCLUDING MRCP) TECHNIQUE: Multiplanar multisequence MR imaging of the abdomen was performed. Heavily T2-weighted images of the biliary and pancreatic ducts were obtained, and three-dimensional MRCP images were rendered by post processing. COMPARISON:  Ultrasound right upper quadrant 01/04/2018 FINDINGS: Lower chest: No acute findings. Hepatobiliary: Homogeneous liver parenchyma without focal mass lesion. Cholelithiasis with multiple small stones layering in the gallbladder. No gallbladder wall thickening or inflammatory changes. Stones are demonstrated in the cystic duct. There is no intra or extrahepatic bile duct dilatation. No filling defects in the bile ducts. Pancreas: No mass, inflammatory changes, or other parenchymal abnormality identified. Spleen:  Within normal limits in size and appearance. Adrenals/Urinary Tract: No masses identified. No evidence of hydronephrosis. Stomach/Bowel: Visualized portions within the abdomen are unremarkable. Vascular/Lymphatic: No pathologically enlarged lymph nodes identified. No abdominal aortic aneurysm demonstrated. Other:  No free air or free fluid indicated in the abdomen. Musculoskeletal: No suspicious bone lesions identified. IMPRESSION: 1. Cholelithiasis with multiple stones in the gallbladder and cystic duct. No inflammatory changes to suggest cholecystitis. 2. Normal caliber intra and extrahepatic bile ducts. No choledocholithiasis. Electronically Signed   By: Burman Nieves M.D.   On: 01/04/2018 06:01   US Abdomen Limited Ruq  Result Date: 01/04/2018 CLINICAL DATA:  Intermittent right upper  quadrant pain since October. Elevated liver function studies. Known gallstones. EXAM: ULTRASOUND ABDOMEN LIMITED RIGHT UPPER QUADRANT COMPARISON:  12/29/2017 FINDINGS: Gallbladder: Contracted gallbladder. Cholelithiasis with multiple stones in the gallbladder creating wall echo shadow complex. No gallbladder wall thickening. Murphy's sign is negative but this is nonspecific in a medicated patient. Common bile duct: Diameter: 5.3 mm, normal Liver: No focal lesion identified. Within normal limits in parenchymal echogenicity. Portal vein is patent on color Doppler imaging with normal direction of blood flow towards the liver. IMPRESSION: Cholelithiasis. No additional changes to suggest cholecystitis. Similar appearance to previous study. Electronically Signed   By: Burman Nieves M.D.   On: 01/04/2018 03:21   IMPRESSION AND PLAN:   A/P: 32F p/w AP/N/V, RUQ U/S (+) cholelithiasis w/o cholecystitis, LFTs abnl. Mild hyperglycemia, leukocytosis, dehydration. -Suspect biliary colic, pt likely passed a gallstone -Labwork and imaging appear to demonstrate mild cholestasis and hepatocellular injury, but are not immediately suggestive of active common bile duct, pancreatic duct or complete biliary tree obstruction -Admit Med/Surg -NPO, advance diet as tolerated (regular diet) -Monitor LFTs -IVF -MRCP pending -Consult GI vs. General Surgery based on MRCP results (more likely the latter) -Will likely benefit from cholecystectomy -Sympomatic mgmt, antiemetics, pain ctrl -Lovenox -Full code -Admission, > 2 midnights   All the records are reviewed and case discussed with ED provider. Management plans discussed with the patient, family and they are in agreement.  CODE STATUS: Full code  TOTAL TIME TAKING CARE OF THIS PATIENT: 75 minutes.    Barbaraann Rondo M.D on 01/04/2018 at 6:29 AM  Between 7am to 6pm - Pager - [phone]  After 6pm go to [email or web address] - [facility]  Office  [phone]  CC: Primary care physician; Center, [facility]  Note: This dictation was prepared with Dragon dictation along with smaller phrase technology. Any transcriptional errors that result from this process are unintentional.

## 2018-01-04 NOTE — Consult Note (Signed)
SURGICAL CONSULTATION NOTE   HISTORY OF PRESENT ILLNESS (HPI):  22 y.o. female presented to [facility] ED for evaluation of abdominal pain. Patient refers that she has been having episodes of abdominal pain since last week. She came to ED and workup was done with finding of cholelithiasis. Ultrasound was done with finding of gallstones but no sign of infection. Normal labs. Patient had recurrence of pain yesterday and came to ED again. New workup again show no sign cholecystitis but this time had elevated bilirubin. MRCP was done and was found negative. New bilirubin now normalized. Patient reports pain associated with nausea and vomiting. Denies fever or chills. Pain localized to the right upper quadrant. Pain does not radiates. Pain aggravated with food intake. Relieved with pain medication at the ER.  Surgery is consulted by Dr. Benjie Karvonen in this context for evaluation and management of cholelithiasis.  PAST MEDICAL HISTORY (PMH):  Past Medical History:  Diagnosis Date  . Headache(784.0)   . Hearing loss    Bilateral aids     PAST SURGICAL HISTORY ([place]):  Past Surgical History:  Procedure Laterality Date  . INNER EAR SURGERY     left ear     MEDICATIONS:  Prior to Admission medications   Medication Sig Start Date End Date Taking? Authorizing Provider  norethindrone-ethinyl estradiol (JUNEL FE,GILDESS FE,LOESTRIN FE) 1-20 MG-MCG tablet Take 1 tablet by mouth daily. 06/20/17  Yes Dove, Myra C, MD  ondansetron (ZOFRAN ODT) 4 MG disintegrating tablet Take 1 tablet (4 mg total) by mouth every 8 (eight) hours as needed for nausea or vomiting. 12/29/17  Yes Paulette Blanch, MD  oxyCODONE-acetaminophen (PERCOCET/ROXICET) 5-325 MG tablet Take 1 tablet by mouth every 4 (four) hours as needed for severe pain. 12/29/17  Yes Paulette Blanch, MD     ALLERGIES:  Allergies  Allergen Reactions  . Bee Venom Shortness Of Breath  . Ibuprofen Hives, Swelling and Rash     SOCIAL HISTORY:  Social History    Socioeconomic History  . Marital status: Single    Spouse name: Not on file  . Number of children: Not on file  . Years of education: Not on file  . Highest education level: Not on file  Occupational History  . Occupation: homemaker  Social Needs  . Financial resource strain: Not on file  . Food insecurity:    Worry: Not on file    Inability: Not on file  . Transportation needs:    Medical: Not on file    Non-medical: Not on file  Tobacco Use  . Smoking status: Never Smoker  . Smokeless tobacco: Never Used  Substance and Sexual Activity  . Alcohol use: No  . Drug use: No  . Sexual activity: Not Currently    Partners: Male    Birth control/protection: OCP, None  Lifestyle  . Physical activity:    Days per week: Not on file    Minutes per session: Not on file  . Stress: Not on file  Relationships  . Social connections:    Talks on phone: Not on file    Gets together: Not on file    Attends religious service: Not on file    Active member of club or organization: Not on file    Attends meetings of clubs or organizations: Not on file    Relationship status: Not on file  . Intimate partner violence:    Fear of current or ex partner: Not on file    Emotionally abused:  Not on file    Physically abused: Not on file    Forced sexual activity: Not on file  Other Topics Concern  . Not on file  Social History Narrative  . Not on file    The patient currently resides (home / rehab facility / nursing home): Home The patient normally is (ambulatory / bedbound): Ambulatory   FAMILY HISTORY:  Family History  Problem Relation Age of Onset  . Diabetes Mother   . COPD Mother   . Heart disease Father      REVIEW OF SYSTEMS:  Constitutional: denies weight loss, fever, chills, or sweats  Eyes: denies any other vision changes, history of eye injury  ENT: denies sore throat, hearing problems  Respiratory: denies shortness of breath, wheezing  Cardiovascular: denies chest  pain, palpitations  Gastrointestinal: positive for abdominal pain, N/V  Genitourinary: denies burning with urination or urinary frequency Musculoskeletal: denies any other joint pains or cramps  Skin: denies any other rashes or skin discolorations  Neurological: denies any other headache, dizziness, weakness  Psychiatric: denies any other depression, anxiety   All other review of systems were negative   VITAL SIGNS:  Temp:  [97.6 F (36.4 C)-98.9 F (37.2 C)] 98.3 F (36.8 C) (07/24 1553) Pulse Rate:  [69-92] 81 (07/24 1553) Resp:  [17-19] 18 (07/24 0606) BP: (95-119)/(55-80) 109/76 (07/24 1553) SpO2:  [97 %-100 %] 99 % (07/24 1553) Weight:  [74.4 kg (164 lb)] 74.4 kg (164 lb) (07/24 0606)     Height: 4' 10" (147.3 cm) Weight: 74.4 kg (164 lb) BMI (Calculated): 34.29   INTAKE/OUTPUT:  This shift: No intake/output data recorded.  Last 2 shifts: _0 @   PHYSICAL EXAM:  Constitutional:  -- Normal body habitus, obese  -- Awake, alert, and oriented x3  Eyes:  -- Pupils equally round and reactive to light  -- No scleral icterus  Ear, nose, and throat:  -- No jugular venous distension  Pulmonary:  -- No crackles  -- Equal breath sounds bilaterally -- Breathing non-labored at rest Cardiovascular:  -- S1, S2 present  -- No pericardial rubs Gastrointestinal:  -- Abdomen soft, tender to right upper quadrant, non-distended, no guarding or rebound tenderness -- No abdominal masses appreciated, pulsatile or otherwise  Musculoskeletal and Integumentary:  -- Wounds or skin discoloration: None appreciated -- Extremities: B/L UE and LE FROM, hands and feet warm, no edema  Neurologic:  -- Motor function: intact and symmetric -- Sensation: intact and symmetric   Labs:  CBC Latest Ref Rng & Units 01/03/2018 12/28/2017 07/07/2015  WBC 3.6 - 11.0 K/uL 12.4(H) 15.3(H) 18.8(H)  Hemoglobin 12.0 - 16.0 g/dL 14.7 15.2 10.8(L)  Hematocrit 35.0 - 47.0 % 42.4 45.1 33.3(L)  Platelets  150 - 440 K/uL 457(H) 468(H) 263   CMP Latest Ref Rng & Units 01/04/2018 01/03/2018 12/28/2017  Glucose 70 - 99 mg/dL - 101(H) 92  BUN 6 - 20 mg/dL - 13 13  Creatinine 0.44 - 1.00 mg/dL - 0.60 0.93  Sodium 135 - 145 mmol/L - 140 139  Potassium 3.5 - 5.1 mmol/L - 3.9 3.7  Chloride 98 - 111 mmol/L - 104 105  CO2 22 - 32 mmol/L - 29 23  Calcium 8.9 - 10.3 mg/dL - 9.6 9.7  Total Protein 6.5 - 8.1 g/dL 7.7 7.7 7.7  Total Bilirubin 0.3 - 1.2 mg/dL 0.9 1.3(H) 0.4  Alkaline Phos 38 - 126 U/L 435(H) 501(H) 95  AST 15 - 41 U/L 222(H) 215(H) 35  ALT 0 - 44  U/L 387(H) 417(H) 27   Imaging studies:  EXAM: ULTRASOUND ABDOMEN LIMITED RIGHT UPPER QUADRANT  COMPARISON:  12/29/2017  FINDINGS: Gallbladder:  Contracted gallbladder. Cholelithiasis with multiple stones in the gallbladder creating wall echo shadow complex. No gallbladder wall thickening. Murphy's sign is negative but this is nonspecific in a medicated patient.  Common bile duct:  Diameter: 5.3 mm, normal  Liver:  No focal lesion identified. Within normal limits in parenchymal echogenicity. Portal vein is patent on color Doppler imaging with normal direction of blood flow towards the liver.  IMPRESSION: Cholelithiasis. No additional changes to suggest cholecystitis. Similar appearance to previous study.  Electronically Signed   By: Lucienne Capers M.D.   On: 01/04/2018 03:21  EXAM: MRI ABDOMEN WITHOUT CONTRAST  (INCLUDING MRCP)  TECHNIQUE: Multiplanar multisequence MR imaging of the abdomen was performed. Heavily T2-weighted images of the biliary and pancreatic ducts were obtained, and three-dimensional MRCP images were rendered by post processing.  COMPARISON:  Ultrasound right upper quadrant 01/04/2018  FINDINGS: Lower chest: No acute findings.  Hepatobiliary: Homogeneous liver parenchyma without focal mass lesion. Cholelithiasis with multiple small stones layering in the gallbladder. No  gallbladder wall thickening or inflammatory changes. Stones are demonstrated in the cystic duct. There is no intra or extrahepatic bile duct dilatation. No filling defects in the bile ducts.  Pancreas: No mass, inflammatory changes, or other parenchymal abnormality identified.  Spleen:  Within normal limits in size and appearance.  Adrenals/Urinary Tract: No masses identified. No evidence of hydronephrosis.  Stomach/Bowel: Visualized portions within the abdomen are unremarkable.  Vascular/Lymphatic: No pathologically enlarged lymph nodes identified. No abdominal aortic aneurysm demonstrated.  Other:  No free air or free fluid indicated in the abdomen.  Musculoskeletal: No suspicious bone lesions identified.  IMPRESSION: 1. Cholelithiasis with multiple stones in the gallbladder and cystic duct. No inflammatory changes to suggest cholecystitis. 2. Normal caliber intra and extrahepatic bile ducts. No choledocholithiasis.  Electronically Signed   By: Lucienne Capers M.D.   On: 01/04/2018 06:01  Assessment/Plan:  22 y.o. female with suspected acute cholecystitis with suspected history of choledocholithiasis (mild elevation of bilirubin) but MRCP was negative for stone on the common bile duct. Bilirubin came down to 0.9. Decreasing trend of liver enzymes and Alk phos. Patient refers that pain has resolved. No jaundice on physical exam. This most likely that the patient passed a stone.  I oriented the patient about the diagnosis of symptomatic cholelithiasis, suspected cholecystitis and the possibility of passed stone. Patient oriented about the surgery recommendations. Surgical technique discussed. Patient oriented about goal of surgery, benefits and risks including: common bile duct injury, retained stone, bile leak, injury to other organs, abscess, bleeding, infection, among others.  Will proceed with surgery.   Arnold Long, MD

## 2018-01-04 NOTE — Op Note (Signed)
Preoperative diagnosis: Symptomatic cholelithiasis with history of elevated bilirubin  Postoperative diagnosis: Symptomatic cholelithiasis with history of elevated bilirubin  Procedure: Laparoscopic Cholecystectomy.   Anesthesia: GETA   Surgeon: Dr. Hazle Quant  Wound Classification: Clean Contaminated  Indications: Patient is a 22 y.o. female developed right upper quadrant pain, nausea and vomiting, leukocytosis and on workup was found to have cholelithiasis with a normal common duct/ cholecystitis/choledocholithiasis/biliary pancreatitis. Laparoscopic cholecystectomy was elected.  Findings: Critical view of safety achieved Severe inflammation was identified Cystic duct and artery identified, ligated and divided Cystic duct very dilated with multiple stones. Opened, stones removed and closed with endoloop PDS Adequate hemostasis  Description of procedure: The patient was placed on the operating table in the supine position. General anesthesia was induced. A time-out was completed verifying correct patient, procedure, site, positioning, and implant(s) and/or special equipment prior to beginning this procedure. An orogastric tube was placed. The abdomen was prepped and draped in the usual sterile fashion.  An incision was made in a natural skin line above the umbilicus.  The fascia was elevated and the Veress needle inserted. Proper position was confirmed by aspiration and saline meniscus test.  The abdomen was insufflated with carbon dioxide to a pressure of 15 mmHg. The patient tolerated insufflation well. A 11-mm trocar was then inserted.  The laparoscope was inserted and the abdomen inspected. No injuries from initial trocar placement were noted. Additional trocars were then inserted in the following locations: a 5-mm trocar in the right epigastrium and two 5-mm trocars along the right costal margin. The abdomen was inspected and no abnormalities were found. The table was placed in the  reverse Trendelenburg position with the right side up.  Filmy adhesions between the gallbladder and omentum, duodenum and transverse colon were lysed sharply. The dome of the gallbladder was grasped with an atraumatic grasper passed through the lateral port and retracted over the dome of the liver. The infundibulum was also grasped with an atraumatic grasper through the midclavicular port and retracted toward the right lower quadrant. This maneuver exposed Calot's triangle. The peritoneum overlying the gallbladder infundibulum was very scared and with thick adhesions. It was carefully incised and the cystic duct, which was very dilated almost the size of the infumdibulum and the cystic artery identified and circumferentially dissected. The cystic duct was so dilated that it was decided to take the gallbladder dome down. The gallbladder was opened on mid body and the stones were removed. The gallbladder and stones were removed in the endobag. The gallbladder was fenestrated until the cystic duct opening was identified. A PDS endoloop was inserted and the cystic duct dilated making sure that the common bile duct was not ligated.  The excess of gallbladder was then divided and removed. Stones were removed with stone scooper.  Hemostasis was checked. The gallbladder was passed off the table as a specimen. The gallbladder fossa was copiously irrigated with saline and hemostasis was obtained. There was no evidence of bleeding from the gallbladder fossa or cystic artery or leakage of the bile from the cystic duct stump. Secondary trocars were removed under direct vision. No bleeding was noted. The laparoscope was withdrawn and the umbilical trocar removed. The abdomen was allowed to collapse. The fascia of the 11mm trocar sites was closed with figure-of-eight 0 vicryl sutures. The skin was closed with subcuticular sutures of 4-0 monocryl and topical skin adhesive. The orogastric tube was removed.  The patient tolerated  the procedure well and was taken to the postanesthesia care  unit in stable condition.   Specimen: Gallbladder  Complications: None  EBL: 15 mL

## 2018-01-04 NOTE — Anesthesia Postprocedure Evaluation (Signed)
Anesthesia Post Note  Patient: Yvonne Hood  Procedure(s) Performed: LAPAROSCOPIC CHOLECYSTECTOMY (N/A Abdomen)  Patient location during evaluation: PACU Anesthesia Type: General Level of consciousness: awake and alert and oriented Pain management: pain level controlled Vital Signs Assessment: post-procedure vital signs reviewed and stable Respiratory status: spontaneous breathing, nonlabored ventilation and respiratory function stable Cardiovascular status: blood pressure returned to baseline and stable Postop Assessment: no signs of nausea or vomiting Anesthetic complications: no     Last Vitals:  Vitals:   01/04/18 2146 01/04/18 2252  BP: 132/85 122/84  Pulse: 80 97  Resp: 16 18  Temp: 36.7 C 36.7 C  SpO2: 98% 98%    Last Pain:  Vitals:   01/04/18 2320  TempSrc:   PainSc: Asleep                 Burnie Therien

## 2018-01-04 NOTE — Transfer of Care (Signed)
Immediate Anesthesia Transfer of Care Note  Patient: Yvonne CarinaElizabeth N Hood  Procedure(s) Performed: LAPAROSCOPIC CHOLECYSTECTOMY (N/A Abdomen)  Patient Location: PACU  Anesthesia Type:General  Level of Consciousness: sedated and patient cooperative  Airway & Oxygen Therapy: Patient Spontanous Breathing and Patient connected to face mask oxygen  Post-op Assessment: Report given to RN and Post -op Vital signs reviewed and stable  Post vital signs: Reviewed and stable  Last Vitals:  Vitals Value Taken Time  BP 136/85 01/04/2018  8:37 PM  Temp 36.4 C 01/04/2018  8:37 PM  Pulse 101 01/04/2018  8:38 PM  Resp 17 01/04/2018  8:38 PM  SpO2 100 % 01/04/2018  8:38 PM  Vitals shown include unvalidated device data.  Last Pain:  Vitals:   01/04/18 1602  TempSrc:   PainSc: 0-No pain      Patients Stated Pain Goal: 4 (01/04/18 0705)  Complications: No apparent anesthesia complications

## 2018-01-04 NOTE — Progress Notes (Signed)
She admitted this morning for nausea vomiting abdominal pain.  MRCP is positive for cholelithiasis.  Consultation placed with surgery for possible cholecystectomy

## 2018-01-04 NOTE — Anesthesia Preprocedure Evaluation (Signed)
Anesthesia Evaluation  Patient identified by MRN, date of birth, ID band Patient awake    Reviewed: Allergy & Precautions, NPO status , Patient's Chart, lab work & pertinent test results  History of Anesthesia Complications Negative for: history of anesthetic complications  Airway Mallampati: III  TM Distance: >3 FB Neck ROM: Full    Dental no notable dental hx.    Pulmonary neg pulmonary ROS, neg sleep apnea, neg COPD,    breath sounds clear to auscultation- rhonchi (-) wheezing      Cardiovascular Exercise Tolerance: Good (-) hypertension(-) CAD and (-) Past MI  Rhythm:Regular Rate:Normal - Systolic murmurs and - Diastolic murmurs    Neuro/Psych  Headaches, negative psych ROS   GI/Hepatic negative GI ROS, Neg liver ROS,   Endo/Other  negative endocrine ROSneg diabetes  Renal/GU negative Renal ROS     Musculoskeletal negative musculoskeletal ROS (+)   Abdominal (+) + obese,   Peds  Hematology negative hematology ROS (+)   Anesthesia Other Findings Past Medical History: No date: Headache(784.0) No date: Hearing loss     Comment:  Bilateral aids   Reproductive/Obstetrics                             Anesthesia Physical Anesthesia Plan  ASA: II  Anesthesia Plan: General   Post-op Pain Management:    Induction: Intravenous  PONV Risk Score and Plan: 2 and Ondansetron, Dexamethasone and Midazolam  Airway Management Planned: Oral ETT  Additional Equipment:   Intra-op Plan:   Post-operative Plan: Extubation in OR  Informed Consent: I have reviewed the patients History and Physical, chart, labs and discussed the procedure including the risks, benefits and alternatives for the proposed anesthesia with the patient or authorized representative who has indicated his/her understanding and acceptance.   Dental advisory given  Plan Discussed with: CRNA and  Anesthesiologist  Anesthesia Plan Comments:         Anesthesia Quick Evaluation

## 2018-01-05 ENCOUNTER — Encounter: Payer: Self-pay | Admitting: General Surgery

## 2018-01-05 LAB — COMPREHENSIVE METABOLIC PANEL
ALT: 362 U/L — ABNORMAL HIGH (ref 0–44)
AST: 232 U/L — AB (ref 15–41)
Albumin: 3.4 g/dL — ABNORMAL LOW (ref 3.5–5.0)
Alkaline Phosphatase: 343 U/L — ABNORMAL HIGH (ref 38–126)
Anion gap: 7 (ref 5–15)
BUN: 9 mg/dL (ref 6–20)
CHLORIDE: 110 mmol/L (ref 98–111)
CO2: 22 mmol/L (ref 22–32)
Calcium: 8.8 mg/dL — ABNORMAL LOW (ref 8.9–10.3)
Creatinine, Ser: 0.59 mg/dL (ref 0.44–1.00)
GFR calc Af Amer: >60 mL/min (ref >60)
GFR calc non Af Amer: >60 mL/min (ref >60)
GLUCOSE: 169 mg/dL — AB (ref 70–99)
Potassium: 4.3 mmol/L (ref 3.5–5.1)
Sodium: 139 mmol/L (ref 135–145)
Total Bilirubin: 0.7 mg/dL (ref 0.3–1.2)
Total Protein: 7.1 g/dL (ref 6.5–8.1)

## 2018-01-05 LAB — HIV ANTIBODY (ROUTINE TESTING W REFLEX): HIV SCREEN 4TH GENERATION: NONREACTIVE

## 2018-01-05 MED ORDER — OXYCODONE-ACETAMINOPHEN 5-325 MG PO TABS: 1.0000 | ORAL_TABLET | ORAL | 0 refills | Status: DC | PRN

## 2018-01-05 NOTE — Discharge Summary (Signed)
[facility] [place] [facility]   PATIENT NAME: Yvonne Hood    MR#:  [id number]  DATE OF BIRTH:  01/15/1996  DATE OF ADMISSION:  01/04/2018 ADMITTING PHYSICIAN: Barbaraann RondoPrasanna Sridharan, MD  DATE OF DISCHARGE: 01/05/2018  PRIMARY CARE PHYSICIAN: [facility]    ADMISSION DIAGNOSIS:  RUQ pain [R10.11] Elevated liver enzymes [R74.8] Calculus of gallbladder without cholecystitis without obstruction [K80.20] Liver function abnormality [R94.5]  DISCHARGE DIAGNOSIS:  Active Problems:   Cholelithiasis   SECONDARY DIAGNOSIS:   Past Medical History:  Diagnosis Date  . Headache(784.0)   . Hearing loss    Bilateral aids    HOSPITAL COURSE:  22 year old female who presented with right upper quadrant pain.  1.  Cholelithiasis with suspected stone passing through common bile duct: Patient is postoperative day #1 status post MRCP and lap cholecystectomy. Pain is controlled.  Patient is tolerating diet.  Patient will have outpatient follow-up with surgery. 2.  Elevated LFTs: This is from passed stone and cholelithiasis.   DISCHARGE CONDITIONS AND DIET:   Stable for discharge on regular diet  CONSULTS OBTAINED:  Treatment Team:  Barbaraann RondoSridharan, Prasanna, MD Carolan Shiverintron-Diaz, Edgardo, MD  DRUG ALLERGIES:   Allergies  Allergen Reactions  . Bee Venom Shortness Of Breath  . Ibuprofen Hives, Swelling and Rash    DISCHARGE MEDICATIONS:   Allergies as of 01/05/2018      Reactions   Bee Venom Shortness Of Breath   Ibuprofen Hives, Swelling, Rash      Medication List    TAKE these medications   norethindrone-ethinyl estradiol 1-20 MG-MCG tablet Commonly known as:  JUNEL FE,GILDESS FE,LOESTRIN FE Take 1 tablet by mouth daily.   ondansetron 4 MG disintegrating tablet Commonly known as:  ZOFRAN ODT Take 1 tablet (4 mg total) by mouth every 8 (eight) hours as needed for nausea or vomiting.   oxyCODONE-acetaminophen 5-325 MG tablet Commonly  known as:  PERCOCET/ROXICET Take 1 tablet by mouth every 4 (four) hours as needed for severe pain.         Today   CHIEF COMPLAINT:   No acute issues overnight   VITAL SIGNS:  Blood pressure 102/69, pulse 84, temperature 97.6 F (36.4 C), temperature source Oral, resp. rate 20, height 4\' 10"  (1.473 m), weight 164 lb (74.4 kg), last menstrual period 12/14/2017, SpO2 97 %, unknown if currently breastfeeding.   REVIEW OF SYSTEMS:  Review of Systems  Constitutional: Negative.  Negative for chills, fever and malaise/fatigue.  HENT: Negative.  Negative for ear discharge, ear pain, hearing loss, nosebleeds and sore throat.   Eyes: Negative.  Negative for blurred vision and pain.  Respiratory: Negative.  Negative for cough, hemoptysis, shortness of breath and wheezing.   Cardiovascular: Negative.  Negative for chest pain, palpitations and leg swelling.  Gastrointestinal: Negative.  Negative for abdominal pain, blood in stool, diarrhea, nausea and vomiting.  Genitourinary: Negative.  Negative for dysuria.  Musculoskeletal: Negative.  Negative for back pain.  Skin: Negative.   Neurological: Negative for dizziness, tremors, speech change, focal weakness, seizures and headaches.  Endo/Heme/Allergies: Negative.  Does not bruise/bleed easily.  Psychiatric/Behavioral: Negative.  Negative for depression, hallucinations and suicidal ideas.     PHYSICAL EXAMINATION:  GENERAL:  22 y.o.-year-old patient lying in the bed with no acute distress.  NECK:  Supple, no jugular venous distention. No thyroid enlargement, no tenderness.  LUNGS: Normal breath sounds bilaterally, no wheezing, rales,rhonchi  No use of accessory muscles of respiration.  CARDIOVASCULAR: S1, S2 normal. No  murmurs, rubs, or gallops.  ABDOMEN: Soft, non-tender, non-distended. Bowel sounds present. No organomegaly or mass.  Wounds are dry and clean EXTREMITIES: No pedal edema, cyanosis, or clubbing.  PSYCHIATRIC: The patient  is alert and oriented x 3.  SKIN: No obvious rash, lesion, or ulcer.   DATA REVIEW:   CBC Recent Labs  Lab 01/03/18 2055  WBC 12.4*  HGB 14.7  HCT 42.4  PLT 457*    Chemistries  Recent Labs  Lab 01/05/18 0534  NA 139  K 4.3  CL 110  CO2 22  GLUCOSE 169*  BUN 9  CREATININE 0.59  CALCIUM 8.8*  AST 232*  ALT 362*  ALKPHOS 343*  BILITOT 0.7    Cardiac Enzymes No results for input(s): TROPONINI in the last 168 hours.  Microbiology Results  @[email or web address] @  RADIOLOGY:  Mr Abdomen Mrcp Wo Contrast  Result Date: 01/04/2018 CLINICAL DATA:  Abdominal pain with history of gallstones. Nausea and vomiting. Elevated liver function studies. EXAM: MRI ABDOMEN WITHOUT CONTRAST  (INCLUDING MRCP) TECHNIQUE: Multiplanar multisequence MR imaging of the abdomen was performed. Heavily T2-weighted images of the biliary and pancreatic ducts were obtained, and three-dimensional MRCP images were rendered by post processing. COMPARISON:  Ultrasound right upper quadrant 01/04/2018 FINDINGS: Lower chest: No acute findings. Hepatobiliary: Homogeneous liver parenchyma without focal mass lesion. Cholelithiasis with multiple small stones layering in the gallbladder. No gallbladder wall thickening or inflammatory changes. Stones are demonstrated in the cystic duct. There is no intra or extrahepatic bile duct dilatation. No filling defects in the bile ducts. Pancreas: No mass, inflammatory changes, or other parenchymal abnormality identified. Spleen:  Within normal limits in size and appearance. Adrenals/Urinary Tract: No masses identified. No evidence of hydronephrosis. Stomach/Bowel: Visualized portions within the abdomen are unremarkable. Vascular/Lymphatic: No pathologically enlarged lymph nodes identified. No abdominal aortic aneurysm demonstrated. Other:  No free air or free fluid indicated in the abdomen. Musculoskeletal: No suspicious bone lesions identified. IMPRESSION: 1. Cholelithiasis with multiple  stones in the gallbladder and cystic duct. No inflammatory changes to suggest cholecystitis. 2. Normal caliber intra and extrahepatic bile ducts. No choledocholithiasis. Electronically Signed   By: Burman Nieves M.D.   On: 01/04/2018 06:01   Mr 3d Recon At Scanner  Result Date: 01/04/2018 CLINICAL DATA:  Abdominal pain with history of gallstones. Nausea and vomiting. Elevated liver function studies. EXAM: MRI ABDOMEN WITHOUT CONTRAST  (INCLUDING MRCP) TECHNIQUE: Multiplanar multisequence MR imaging of the abdomen was performed. Heavily T2-weighted images of the biliary and pancreatic ducts were obtained, and three-dimensional MRCP images were rendered by post processing. COMPARISON:  Ultrasound right upper quadrant 01/04/2018 FINDINGS: Lower chest: No acute findings. Hepatobiliary: Homogeneous liver parenchyma without focal mass lesion. Cholelithiasis with multiple small stones layering in the gallbladder. No gallbladder wall thickening or inflammatory changes. Stones are demonstrated in the cystic duct. There is no intra or extrahepatic bile duct dilatation. No filling defects in the bile ducts. Pancreas: No mass, inflammatory changes, or other parenchymal abnormality identified. Spleen:  Within normal limits in size and appearance. Adrenals/Urinary Tract: No masses identified. No evidence of hydronephrosis. Stomach/Bowel: Visualized portions within the abdomen are unremarkable. Vascular/Lymphatic: No pathologically enlarged lymph nodes identified. No abdominal aortic aneurysm demonstrated. Other:  No free air or free fluid indicated in the abdomen. Musculoskeletal: No suspicious bone lesions identified. IMPRESSION: 1. Cholelithiasis with multiple stones in the gallbladder and cystic duct. No inflammatory changes to suggest cholecystitis. 2. Normal caliber intra and extrahepatic bile ducts. No choledocholithiasis. Electronically Signed   By: Chrissie Noa  Andria Meuse M.D.   On: 01/04/2018 06:01   US Abdomen Limited  Ruq  Result Date: 01/04/2018 CLINICAL DATA:  Intermittent right upper quadrant pain since October. Elevated liver function studies. Known gallstones. EXAM: ULTRASOUND ABDOMEN LIMITED RIGHT UPPER QUADRANT COMPARISON:  12/29/2017 FINDINGS: Gallbladder: Contracted gallbladder. Cholelithiasis with multiple stones in the gallbladder creating wall echo shadow complex. No gallbladder wall thickening. Murphy's sign is negative but this is nonspecific in a medicated patient. Common bile duct: Diameter: 5.3 mm, normal Liver: No focal lesion identified. Within normal limits in parenchymal echogenicity. Portal vein is patent on color Doppler imaging with normal direction of blood flow towards the liver. IMPRESSION: Cholelithiasis. No additional changes to suggest cholecystitis. Similar appearance to previous study. Electronically Signed   By: Burman Nieves M.D.   On: 01/04/2018 03:21      Allergies as of 01/05/2018      Reactions   Bee Venom Shortness Of Breath   Ibuprofen Hives, Swelling, Rash      Medication List    TAKE these medications   norethindrone-ethinyl estradiol 1-20 MG-MCG tablet Commonly known as:  JUNEL FE,GILDESS FE,LOESTRIN FE Take 1 tablet by mouth daily.   ondansetron 4 MG disintegrating tablet Commonly known as:  ZOFRAN ODT Take 1 tablet (4 mg total) by mouth every 8 (eight) hours as needed for nausea or vomiting.   oxyCODONE-acetaminophen 5-325 MG tablet Commonly known as:  PERCOCET/ROXICET Take 1 tablet by mouth every 4 (four) hours as needed for severe pain.           Management plans discussed with the patient and she is in agreement. Stable for discharge home  Patient should follow up with surgery  CODE STATUS:     Code Status Orders  (From admission, onward)        Start     Ordered   01/04/18 0600  Full code  Continuous     01/04/18 0559    Code Status History    Date Active Date Inactive Code Status Order ID Comments User Context   07/07/2015 2337  07/09/2015 1536 Full Code [id number]  Palma Holter, MD Inpatient   07/06/2015 2252 07/07/2015 2337 Full Code [id number]  Palma Holter, MD Inpatient      TOTAL TIME TAKING CARE OF THIS PATIENT: 38 minutes.    Note: This dictation was prepared with Dragon dictation along with smaller phrase technology. Any transcriptional errors that result from this process are unintentional.  Sharniece Gibbon M.D on 01/05/2018 at 12:25 PM  Between 7am to 6pm - Pager - [phone] After 6pm go to [email or web address] - Social research officer, government  [facility]  Office  [phone]  CC: Primary care physician; [facility], [place] [facility]

## 2018-01-05 NOTE — Progress Notes (Signed)
Patient ID: Yvonne Hood, female   DOB: July 25, 1995, 22 y.o.   MRN: [id number]     [place] [facility] Day(s): 1.   Post op day(s): 1 Day Post-Op.   Interval History: Patient seen and examined, no acute events or new complaints overnight. Patient reports feeling mild soreness but tolerable and controlled with pain medications, denies nausea or vomiting.  Vital signs in last 24 hours: [min-max] current  Temp:  [97.5 F (36.4 C)-98.5 F (36.9 C)] 98.5 F (36.9 C) (07/25 0355) Pulse Rate:  [69-104] 94 (07/25 0355) Resp:  [13-20] 18 (07/25 0355) BP: (95-141)/(55-99) 108/66 (07/25 0355) SpO2:  [96 %-100 %] 97 % (07/25 0355)     Height: 4' 10"  (147.3 cm) Weight: 74.4 kg (164 lb) BMI (Calculated): 34.29     Physical Exam:  Constitutional: alert, cooperative and no distress  Respiratory: breathing non-labored at rest  Cardiovascular: regular rate and sinus rhythm  Gastrointestinal: soft, mild-tender, and non-distended. Wounds are dry and clean.   Labs:  CBC Latest Ref Rng & Units 01/03/2018 12/28/2017 07/07/2015  WBC 3.6 - 11.0 K/uL 12.4(H) 15.3(H) 18.8(H)  Hemoglobin 12.0 - 16.0 g/dL 14.7 15.2 10.8(L)  Hematocrit 35.0 - 47.0 % 42.4 45.1 33.3(L)  Platelets 150 - 440 K/uL 457(H) 468(H) 263   CMP Latest Ref Rng & Units 01/05/2018 01/04/2018 01/03/2018  Glucose 70 - 99 mg/dL 169(H) - 101(H)  BUN 6 - 20 mg/dL 9 - 13  Creatinine 0.44 - 1.00 mg/dL 0.59 - 0.60  Sodium 135 - 145 mmol/L 139 - 140  Potassium 3.5 - 5.1 mmol/L 4.3 - 3.9  Chloride 98 - 111 mmol/L 110 - 104  CO2 22 - 32 mmol/L 22 - 29  Calcium 8.9 - 10.3 mg/dL 8.8(L) - 9.6  Total Protein 6.5 - 8.1 g/dL 7.1 7.7 7.7  Total Bilirubin 0.3 - 1.2 mg/dL 0.7 0.9 1.3(H)  Alkaline Phos 38 - 126 U/L 343(H) 435(H) 501(H)  AST 15 - 41 U/L 232(H) 222(H) 215(H)  ALT 0 - 44 U/L 362(H) 387(H) 417(H)   Imaging studies: No new pertinent imaging studies   Assessment/Plan: 22 y.o. female with cholelithiasis with suspected stone  passing through common bile duct 1 Day Post-Op s/p MCRP and lap cholecystectomy.  Recovering well from surgery. Tolerating diet. Pain controlled. Bilirubin continue on decreasing trent. Alk phos also decreasing. Patient status post difficult cholecystectomy due to dilated cystic duct with stones. Patient oriented about that if she develop pain, yellow sclera, nausea or vomiting or fever, she will need to go to ER for further evaluation to rule out retained stones.   Arnold Long, MD

## 2018-01-05 NOTE — Progress Notes (Signed)
Patient discharged home. Discharge instructions, prescriptions and follow up appointment given to and reviewed with patient. Patient verbalized understanding. Patient wheeled out by auxiliary.  

## 2018-01-06 LAB — SURGICAL PATHOLOGY

## 2018-01-10 ENCOUNTER — Ambulatory Visit: Payer: Self-pay | Admitting: Surgery

## 2018-01-18 ENCOUNTER — Encounter: Payer: Self-pay | Admitting: Surgery

## 2018-05-02 ENCOUNTER — Other Ambulatory Visit: Payer: Self-pay | Admitting: Obstetrics & Gynecology

## 2018-05-02 DIAGNOSIS — Z30011 Encounter for initial prescription of contraceptive pills: Secondary | ICD-10-CM

## 2018-05-03 ENCOUNTER — Emergency Department
Admission: EM | Admit: 2018-05-03 | Discharge: 2018-05-03 | Disposition: A | Discharge status: Home / Self Care | Payer: Medicaid Other | Attending: Emergency Medicine | Admitting: Emergency Medicine

## 2018-05-03 ENCOUNTER — Encounter: Payer: Self-pay | Admitting: Emergency Medicine

## 2018-05-03 ENCOUNTER — Other Ambulatory Visit: Payer: Self-pay

## 2018-05-03 DIAGNOSIS — Z3202 Encounter for pregnancy test, result negative: Secondary | ICD-10-CM | POA: Diagnosis not present

## 2018-05-03 DIAGNOSIS — R109 Unspecified abdominal pain: Secondary | ICD-10-CM | POA: Diagnosis not present

## 2018-05-03 DIAGNOSIS — N939 Abnormal uterine and vaginal bleeding, unspecified: Secondary | ICD-10-CM | POA: Insufficient documentation

## 2018-05-03 LAB — HCG, QUANTITATIVE, PREGNANCY: hCG, Beta Chain, Quant, S: <1 m[IU]/mL (ref <5)

## 2018-05-03 LAB — WET PREP, GENITAL
Sperm: NONE SEEN
Trich, Wet Prep: NONE SEEN
Yeast Wet Prep HPF POC: NONE SEEN

## 2018-05-03 LAB — CBC
HCT: 45.6 % (ref 36.0–46.0)
HEMOGLOBIN: 14.8 g/dL (ref 12.0–15.0)
MCH: 28.9 pg (ref 26.0–34.0)
MCHC: 32.5 g/dL (ref 30.0–36.0)
MCV: 89.1 fL (ref 80.0–100.0)
Platelets: 469 10*3/uL — ABNORMAL HIGH (ref 150–400)
RBC: 5.12 MIL/uL — ABNORMAL HIGH (ref 3.87–5.11)
RDW: 12.4 % (ref 11.5–15.5)
WBC: 11.3 10*3/uL — ABNORMAL HIGH (ref 4.0–10.5)
nRBC: 0 % (ref 0.0–0.2)

## 2018-05-03 LAB — URINALYSIS, COMPLETE (UACMP) WITH MICROSCOPIC
Bacteria, UA: NONE SEEN
Bilirubin Urine: NEGATIVE
GLUCOSE, UA: NEGATIVE mg/dL
HGB URINE DIPSTICK: NEGATIVE
Ketones, ur: NEGATIVE mg/dL
Leukocytes, UA: NEGATIVE
NITRITE: NEGATIVE
PH: 5 (ref 5.0–8.0)
PROTEIN: NEGATIVE mg/dL
SPECIFIC GRAVITY, URINE: 1.011 (ref 1.005–1.030)

## 2018-05-03 LAB — COMPREHENSIVE METABOLIC PANEL
ALT: 38 U/L (ref 0–44)
ANION GAP: 9 (ref 5–15)
AST: 23 U/L (ref 15–41)
Albumin: 4.1 g/dL (ref 3.5–5.0)
Alkaline Phosphatase: 101 U/L (ref 38–126)
BILIRUBIN TOTAL: 0.4 mg/dL (ref 0.3–1.2)
BUN: 13 mg/dL (ref 6–20)
CO2: 27 mmol/L (ref 22–32)
Calcium: 9.8 mg/dL (ref 8.9–10.3)
Chloride: 104 mmol/L (ref 98–111)
Creatinine, Ser: 0.68 mg/dL (ref 0.44–1.00)
GFR calc Af Amer: >60 mL/min (ref >60)
Glucose, Bld: 88 mg/dL (ref 70–99)
POTASSIUM: 4.3 mmol/L (ref 3.5–5.1)
SODIUM: 140 mmol/L (ref 135–145)
TOTAL PROTEIN: 7.7 g/dL (ref 6.5–8.1)

## 2018-05-03 LAB — CHLAMYDIA/NGC RT PCR (ARMC ONLY)
CHLAMYDIA TR: NOT DETECTED
N gonorrhoeae: NOT DETECTED

## 2018-05-03 LAB — POCT PREGNANCY, URINE: PREG TEST UR: NEGATIVE

## 2018-05-03 LAB — LIPASE, BLOOD: LIPASE: 36 U/L (ref 11–51)

## 2018-05-03 MED ORDER — ONDANSETRON 4 MG PO TBDP: 4.0000 mg | ORAL_TABLET | Freq: Three times a day (TID) | ORAL | 0 refills | Status: DC | PRN

## 2018-05-03 MED ORDER — ACETAMINOPHEN 500 MG PO TABS
1000.0000 mg | ORAL_TABLET | Freq: Once | ORAL | Status: AC
  Administered 2018-05-03: 1000 mg via ORAL
  Filled 2018-05-03: qty 2

## 2018-05-03 MED ORDER — ONDANSETRON 4 MG PO TBDP
4.0000 mg | ORAL_TABLET | Freq: Once | ORAL | Status: AC
  Administered 2018-05-03: 4 mg via ORAL
  Filled 2018-05-03: qty 1

## 2018-05-03 NOTE — ED Provider Notes (Signed)
[facility] Emergency Department Provider Note  ____________________________________________  Time seen: Approximately 9:33 PM  I have reviewed the triage vital signs and the nursing notes.   HISTORY  Chief Complaint Abdominal Pain   HPI CAILEY TRIGUEROS is a 22 y.o. female no significant past medical history who presents for evaluation of abdominal pain.  Patient reports that 5 days ago she started having vaginal bleeding.  She reports that she is not due for her.  For another week.  She also started having diffuse moderate and intermittent abdominal cramping that is similar to her cramping during menstrual periods.  She has had a mild vaginal discharge for the last few days.  No prior history of STDs.  No dysuria, hematuria, vomiting, diarrhea, constipation.  No fever or chills. She has had nausea. She is currently spotting only.   Past Medical History:  Diagnosis Date  . Headache(784.0)   . Hearing loss    Bilateral aids    Patient Active Problem List   Diagnosis Date Noted  . Cholelithiasis 01/04/2018  . Headache, rebound 02/11/2015  . Rubella non-immune status, antepartum 12/19/2014  . Migraine without aura 04/25/2012    Past Surgical History:  Procedure Laterality Date  . CHOLECYSTECTOMY N/A 01/04/2018   Procedure: LAPAROSCOPIC CHOLECYSTECTOMY;  Surgeon: Carolan Shiver, MD;  Location: [facility] ORS;  Service: General;  Laterality: N/A;  . INNER EAR SURGERY     left ear    Prior to Admission medications   Medication Sig Start Date End Date Taking? Authorizing Provider  norethindrone-ethinyl estradiol (JUNEL FE,GILDESS FE,LOESTRIN FE) 1-20 MG-MCG tablet Take 1 tablet by mouth daily. 06/20/17   Allie Bossier, MD  ondansetron (ZOFRAN ODT) 4 MG disintegrating tablet Take 1 tablet (4 mg total) by mouth every 8 (eight) hours as needed for nausea or vomiting. 05/03/18   Nita Sickle, MD  oxyCODONE-acetaminophen (PERCOCET/ROXICET) 5-325 MG  tablet Take 1 tablet by mouth every 4 (four) hours as needed for severe pain. 01/05/18   Adrian Saran, MD    Allergies Bee venom and Ibuprofen  Family History  Problem Relation Age of Onset  . Diabetes Mother   . COPD Mother   . Heart disease Father     Social History Social History   Tobacco Use  . Smoking status: Never Smoker  . Smokeless tobacco: Never Used  Substance Use Topics  . Alcohol use: No  . Drug use: No    Review of Systems  Constitutional: Negative for fever. Eyes: Negative for visual changes. ENT: Negative for sore throat. Neck: No neck pain  Cardiovascular: Negative for chest pain. Respiratory: Negative for shortness of breath. Gastrointestinal: + cramping abdominal pain and nausea. No vomiting or diarrhea.  Genitourinary: Negative for dysuria. + vaginal bleeding and discharge Musculoskeletal: Negative for back pain. Skin: Negative for rash. Neurological: Negative for headaches, weakness or numbness. Psych: No SI or HI  ____________________________________________   PHYSICAL EXAM:  VITAL SIGNS: ED Triage Vitals  Enc Vitals Group     BP 05/03/18 1851 126/88     Pulse Rate 05/03/18 1851 90     Resp 05/03/18 1851 20     Temp 05/03/18 1851 97.6 F (36.4 C)     Temp Source 05/03/18 1851 Oral     SpO2 05/03/18 1851 100 %     Weight 05/03/18 1851 162 lb (73.5 kg)     Height 05/03/18 1851 4\' 11"  (1.499 m)     Head Circumference --  Peak Flow --      Pain Score 05/03/18 1901 7     Pain Loc --      Pain Edu? --      Excl. in GC? --     Constitutional: Alert and oriented. Well appearing and in no apparent distress. HEENT:      Head: Normocephalic and atraumatic.         Eyes: Conjunctivae are normal. Sclera is non-icteric.       Mouth/Throat: Mucous membranes are moist.       Neck: Supple with no signs of meningismus. Cardiovascular: Regular rate and rhythm. No murmurs, gallops, or rubs. 2+ symmetrical distal pulses are present in all  extremities. No JVD. Respiratory: Normal respiratory effort. Lungs are clear to auscultation bilaterally. No wheezes, crackles, or rhonchi.  Gastrointestinal: Soft, mild diffuse tenderness to palpation, and non distended with positive bowel sounds. No rebound or guarding. Pelvic exam: Normal external genitalia, no rashes or lesions. Normal cervical mucus. Os closed with small lesion noted at 5 o'clock, small amount of blood from os. No cervical motion tenderness.  No uterine or adnexal tenderness.   Musculoskeletal: Nontender with normal range of motion in all extremities. No edema, cyanosis, or erythema of extremities. Neurologic: Normal speech and language. Face is symmetric. Moving all extremities. No gross focal neurologic deficits are appreciated. Skin: Skin is warm, dry and intact. No rash noted. Psychiatric: Mood and affect are normal. Speech and behavior are normal.  ____________________________________________   LABS (all labs ordered are listed, but only abnormal results are displayed)  Labs Reviewed  CBC - Abnormal; Notable for the following components:      Result Value   WBC 11.3 (*)    RBC 5.12 (*)    Platelets 469 (*)    All other components within normal limits  URINALYSIS, COMPLETE (UACMP) WITH MICROSCOPIC - Abnormal; Notable for the following components:   Color, Urine YELLOW (*)    APPearance CLEAR (*)    All other components within normal limits  WET PREP, GENITAL  CHLAMYDIA/NGC RT PCR ([facility] ONLY)  LIPASE, BLOOD  COMPREHENSIVE METABOLIC PANEL  HCG, QUANTITATIVE, PREGNANCY  POCT PREGNANCY, URINE  POC URINE PREG, ED   ____________________________________________  EKG  none  ____________________________________________  RADIOLOGY  none  ____________________________________________   PROCEDURES  Procedure(s) performed: None Procedures Critical Care performed:  None ____________________________________________   INITIAL IMPRESSION /  ASSESSMENT AND PLAN / ED COURSE   22 y.o. female no significant past medical history who presents for evaluation of 5 days of vaginal bleeding and diffuse cramping abdominal pain.  Patient is extremely well-appearing and in no distress with normal vital signs, abdomen is soft with mild diffuse tenderness, no localized tenderness rebound or guarding.  Pelvic exam shows small amount of blood coming from the os with a lesion seen at 5 o'clock. Swabs for STD testing sent.  I discussed with the patient the finding of pelvic exam.  She has a Pap smear scheduled with her PCP next week.  Recommended close follow-up for further investigation of that lesion.  Labs showing negative hCG, UA with no evidence of infection, normal CBC and lipase and mild leukocytosis of 11.3K. No clinical suspicion of ovarian pathology, appendicitis, TOA. Most likely cramping from early menstrual period. Recommended ibuprofen and zofran at home, re-evaluation in 24 hours if pain persists. Discussed standard return precautions.      As part of my medical decision making, I reviewed the following data within the electronic MEDICAL RECORD NUMBER  Nursing notes reviewed and incorporated, Labs reviewed , Old chart reviewed, Notes from prior ED visits and [place] Controlled Substance Database    Pertinent labs & imaging results that were available during my care of the patient were reviewed by me and considered in my medical decision making (see chart for details).    ____________________________________________   FINAL CLINICAL IMPRESSION(S) / ED DIAGNOSES  Final diagnoses:  Abdominal cramping  Vaginal bleeding      NEW MEDICATIONS STARTED DURING THIS VISIT:  ED Discharge Orders         Ordered    ondansetron (ZOFRAN ODT) 4 MG disintegrating tablet  Every 8 hours PRN     05/03/18 2141           Note:  This document was prepared using Dragon voice recognition software and may include unintentional dictation errors.      Nita SickleVeronese, [place], MD 05/06/18 2330

## 2018-05-03 NOTE — ED Triage Notes (Signed)
Patient reports lower abdominal pain and cramping with nausea x1 week. Denies V/D. Patient also states she has had vaginal bleeding since Friday. Not due for period for another week. Denies urinary symptoms.

## 2018-05-03 NOTE — ED Notes (Signed)
First RN: abd pain that radiates around to back. A&O, ambulatory.

## 2018-05-03 NOTE — Discharge Instructions (Addendum)

## 2018-05-08 NOTE — Progress Notes (Signed)
LAST PAP NORMAL- 08/26/2015 HIV today  Flu shot today

## 2018-05-10 ENCOUNTER — Other Ambulatory Visit (HOSPITAL_COMMUNITY)
Admission: RE | Admit: 2018-05-10 | Discharge: 2018-05-10 | Disposition: A | Discharge status: Home / Self Care | Payer: Medicaid Other | Source: Ambulatory Visit | Attending: Advanced Practice Midwife | Admitting: Advanced Practice Midwife

## 2018-05-10 ENCOUNTER — Encounter: Payer: Self-pay | Admitting: Advanced Practice Midwife

## 2018-05-10 ENCOUNTER — Ambulatory Visit (INDEPENDENT_AMBULATORY_CARE_PROVIDER_SITE_OTHER): Payer: Medicaid Other | Admitting: Advanced Practice Midwife

## 2018-05-10 ENCOUNTER — Telehealth: Payer: Self-pay | Admitting: Clinical

## 2018-05-10 VITALS — BP 127/84 | HR 71 | Wt 161.1 lb

## 2018-05-10 DIAGNOSIS — F32A Depression, unspecified: Secondary | ICD-10-CM

## 2018-05-10 DIAGNOSIS — Z23 Encounter for immunization: Secondary | ICD-10-CM

## 2018-05-10 DIAGNOSIS — Z01419 Encounter for gynecological examination (general) (routine) without abnormal findings: Secondary | ICD-10-CM

## 2018-05-10 DIAGNOSIS — Z0000 Encounter for general adult medical examination without abnormal findings: Secondary | ICD-10-CM

## 2018-05-10 DIAGNOSIS — Z202 Contact with and (suspected) exposure to infections with a predominantly sexual mode of transmission: Secondary | ICD-10-CM

## 2018-05-10 DIAGNOSIS — F329 Major depressive disorder, single episode, unspecified: Secondary | ICD-10-CM

## 2018-05-10 DIAGNOSIS — F419 Anxiety disorder, unspecified: Secondary | ICD-10-CM | POA: Insufficient documentation

## 2018-05-10 MED ORDER — NORETHIN ACE-ETH ESTRAD-FE 1-20 MG-MCG PO TABS: 1.0000 | ORAL_TABLET | Freq: Every day | ORAL | 11 refills | Status: DC

## 2018-05-10 MED ORDER — ALPRAZOLAM 0.5 MG PO TABS: 0.5000 mg | ORAL_TABLET | Freq: Every evening | ORAL | 3 refills | Status: DC | PRN

## 2018-05-10 NOTE — Telephone Encounter (Signed)
Left HIPPA-compliant message to call back Byrd Terrero from [facility]  at [phone].  

## 2018-05-10 NOTE — Patient Instructions (Signed)

## 2018-05-10 NOTE — Progress Notes (Signed)
GYNECOLOGY ANNUAL PREVENTATIVE CARE ENCOUNTER NOTE  Subjective:   Yvonne Hood is a 22 y.o. 181P1001 female here for a routine annual gynecologic exam.  Current complaints: anxiety and depression.  Patient cares for her 23 year old child and her grandmother, who has Alzheimer's. She denies thoughts of self-harm, harm to others to CNM but states to RN she has had rare thoughts of harming herself. She does not have a plan for self-harm and has not attempted self-harm  Denies abnormal vaginal bleeding, discharge, pelvic pain, problems with intercourse or other gynecologic concerns.    Gynecologic History Patient's last menstrual period was 04/28/2018. Contraception: OCP (estrogen/progesterone) Last Pap: 08/26/2015. Results were: normal with negative HPV Last mammogram: N/A age 22.   Obstetric History OB History  Gravida Para Term Preterm AB Living  1 1 1  0 0 1  SAB TAB Ectopic Multiple Live Births  0 0 0 0 1    # Outcome Date GA Lbr Len/2nd Weight Sex Delivery Anes PTL Lv  1 Term 07/07/15 [id number] 32:48 / 00:02 7 lb 8.3 oz (3.41 kg) M Vag-Spont EPI  LIV    Past Medical History:  Diagnosis Date  . Headache(784.0)   . Hearing loss    Bilateral aids    Past Surgical History:  Procedure Laterality Date  . CHOLECYSTECTOMY N/A 01/04/2018   Procedure: LAPAROSCOPIC CHOLECYSTECTOMY;  Surgeon: Carolan Shiverintron-Diaz, Edgardo, MD;  Location: [facility] ORS;  Service: General;  Laterality: N/A;  . INNER EAR SURGERY     left ear    Current Outpatient Medications on File Prior to Visit  Medication Sig Dispense Refill  . norethindrone-ethinyl estradiol (JUNEL FE,GILDESS FE,LOESTRIN FE) 1-20 MG-MCG tablet Take 1 tablet by mouth daily. 28 tablet 10  . ondansetron (ZOFRAN ODT) 4 MG disintegrating tablet Take 1 tablet (4 mg total) by mouth every 8 (eight) hours as needed for nausea or vomiting. (Patient not taking: Reported on 05/10/2018) 20 tablet 0  . oxyCODONE-acetaminophen (PERCOCET/ROXICET) 5-325 MG  tablet Take 1 tablet by mouth every 4 (four) hours as needed for severe pain. (Patient not taking: Reported on 05/10/2018) 15 tablet 0   No current facility-administered medications on file prior to visit.     Allergies  Allergen Reactions  . Bee Venom Shortness Of Breath  . Ibuprofen Hives, Swelling and Rash    Social History:  reports that she has never smoked. She has never used smokeless tobacco. She reports that she does not drink alcohol or use drugs.  Family History  Problem Relation Age of Onset  . Diabetes Mother   . COPD Mother   . Heart disease Father     The following portions of the patient's history were reviewed and updated as appropriate: allergies, current medications, past family history, past medical history, past social history, past surgical history and problem list.  Review of Systems Pertinent items noted in HPI and remainder of comprehensive ROS otherwise negative.   Objective:  BP 127/84   Pulse 71   Wt 161 lb 1.6 oz (73.1 kg)   LMP 04/28/2018   BMI 32.54 kg/m  CONSTITUTIONAL: Well-developed, well-nourished female in no acute distress.  HENT:  Normocephalic, atraumatic, External right and left ear normal. Oropharynx is clear and moist EYES: Conjunctivae and EOM are normal. Pupils are equal, round, and reactive to light. No scleral icterus.  NECK: Normal range of motion, supple, no masses.  Normal thyroid.  SKIN: Skin is warm and dry. No rash noted. Not diaphoretic. No erythema. No pallor.  MUSCULOSKELETAL: Normal range of motion. No tenderness.  No cyanosis, clubbing, or edema.  2+ distal pulses. NEUROLOGIC: Alert and oriented to person, place, and time. Normal reflexes, muscle tone coordination. No cranial nerve deficit noted. PSYCHIATRIC: Normal mood and affect. Normal behavior. Normal judgment and thought content. CARDIOVASCULAR: Normal heart rate noted, regular rhythm RESPIRATORY: Clear to auscultation bilaterally. Effort and breath sounds normal,  no problems with respiration noted. BREASTS: Symmetric in size. No masses, skin changes, nipple drainage, or lymphadenopathy. ABDOMEN: Soft, normal bowel sounds, no distention noted.  No tenderness, rebound or guarding.  PELVIC: Normal appearing external genitalia; normal appearing vaginal mucosa and cervix.  Scant amount of thick white vaginal discharge noted.  Pap smear obtained.  Normal uterine size, no other palpable masses, no uterine or adnexal tenderness.    Assessment and Plan:  1. Well woman exam with routine gynecological exam --No physical complaints or concerns --Consider 30-40 minutes of activity 4-5 days per week. Simple periods of elevated heart rate with consistency advised --Junel prescription refilled  2. Anxiety -- Previously took Xanax for anxiety and requesting today. Rx to pharmacy  3. Depression, unspecified depression type --PHQ-9 today score of 21 --Encouraged cognitive behavioral therapy --Ambulatory referral to behavioral therapy. [facility] sent to [facility]   4. Possible exposure to STD - Patient declines full STI screening, requests only HIV Antibody (routine testing w rflx)  Will follow up results of pap smear and manage accordingly. Routine preventative health maintenance measures emphasized. Please refer to After Visit Summary for other counseling recommendations.   Clayton Bibles, Certified Nurse Midwife [facility] for [employer], [facility]

## 2018-05-10 NOTE — Addendum Note (Signed)
Addended by: Scheryl MartenSOLIZ, CHRISTINE on: 05/10/2018 03:36 PM   Modules accepted: Orders

## 2018-05-11 LAB — HIV ANTIBODY (ROUTINE TESTING W REFLEX): HIV SCREEN 4TH GENERATION: NONREACTIVE

## 2018-05-17 LAB — CYTOLOGY - PAP: Diagnosis: NEGATIVE

## 2018-05-19 ENCOUNTER — Institutional Professional Consult (permissible substitution): Payer: Medicaid Other

## 2018-06-05 ENCOUNTER — Other Ambulatory Visit: Payer: Self-pay | Admitting: *Deleted

## 2018-06-05 DIAGNOSIS — Z30011 Encounter for initial prescription of contraceptive pills: Secondary | ICD-10-CM

## 2018-06-05 MED ORDER — ALPRAZOLAM 0.5 MG PO TABS: 0.5000 mg | ORAL_TABLET | Freq: Every evening | ORAL | 0 refills | Status: DC | PRN

## 2018-06-05 MED ORDER — NORETHIN ACE-ETH ESTRAD-FE 1-20 MG-MCG PO TABS: 1.0000 | ORAL_TABLET | Freq: Every day | ORAL | 9 refills | Status: DC

## 2018-06-05 NOTE — Telephone Encounter (Signed)
[facility] old [facility] has closed and [facility] but [facility]. Called in patients OCP's, and will contact provider in regards to sending over Xanax RX.  Scheryl Martenhristine Ryott Rafferty, RN

## 2018-08-17 ENCOUNTER — Other Ambulatory Visit: Payer: Self-pay

## 2018-08-18 ENCOUNTER — Telehealth: Payer: Self-pay | Admitting: *Deleted

## 2018-08-18 NOTE — Telephone Encounter (Signed)
Left message for patient to call back to discuss her follow up appointment per [facility] CNM.

## 2018-08-23 ENCOUNTER — Ambulatory Visit: Payer: Medicaid Other | Admitting: Advanced Practice Midwife

## 2018-11-29 ENCOUNTER — Ambulatory Visit (INDEPENDENT_AMBULATORY_CARE_PROVIDER_SITE_OTHER): Payer: Medicaid Other | Admitting: *Deleted

## 2018-11-29 ENCOUNTER — Other Ambulatory Visit: Payer: Self-pay

## 2018-11-29 ENCOUNTER — Other Ambulatory Visit (HOSPITAL_COMMUNITY)
Admission: RE | Admit: 2018-11-29 | Discharge: 2018-11-29 | Disposition: A | Discharge status: Home / Self Care | Payer: Medicaid Other | Source: Ambulatory Visit | Attending: Advanced Practice Midwife | Admitting: Advanced Practice Midwife

## 2018-11-29 ENCOUNTER — Encounter: Payer: Self-pay | Admitting: Radiology

## 2018-11-29 VITALS — BP 125/83 | HR 81

## 2018-11-29 DIAGNOSIS — Z113 Encounter for screening for infections with a predominantly sexual mode of transmission: Secondary | ICD-10-CM

## 2018-11-29 DIAGNOSIS — N912 Amenorrhea, unspecified: Secondary | ICD-10-CM

## 2018-11-29 DIAGNOSIS — Z3202 Encounter for pregnancy test, result negative: Secondary | ICD-10-CM

## 2018-11-29 LAB — POCT URINE PREGNANCY: Preg Test, Ur: NEGATIVE

## 2018-11-29 NOTE — Progress Notes (Signed)
SUBJECTIVE:  23 y.o. female wanting to get a UPT due to missed period and STD testing. Denies abnormal vaginal bleeding or significant pelvic pain or fever. No UTI symptoms. Denies history of known exposure to STD.  Last LMP 10/31/2018  OBJECTIVE:  She appears well, afebrile. UPT in office was negative.       PLAN:  GC, chlamydia, trichomonas,  probe sent to lab. Treatment: To be determined once lab results are received ROV prn if symptoms persist or worsen.

## 2018-11-29 NOTE — Progress Notes (Signed)
ATTESTATION OF SUPERVISION OF RN: Evaluation and management procedures were performed by the RN under my supervision and collaboration. I have reviewed the nursing note and chart and agree with the management and plan for this patient.  Samantha Weinhold, CNM  

## 2018-11-30 LAB — CERVICOVAGINAL ANCILLARY ONLY
Chlamydia: NEGATIVE
Neisseria Gonorrhea: NEGATIVE
Trichomonas: NEGATIVE

## 2019-02-26 ENCOUNTER — Ambulatory Visit: Payer: Medicaid Other | Admitting: Obstetrics & Gynecology

## 2019-03-30 ENCOUNTER — Encounter: Payer: Self-pay | Admitting: Radiology

## 2019-05-14 IMAGING — MR MR 3D RECON AT SCANNER
9 of 10 series · 35 of 48 positions shown · non-contrast
Comparison: Ultrasound right upper quadrant 01/04/2018

CLINICAL DATA: Abdominal pain with history of gallstones. Nausea
and vomiting. Elevated liver function studies.

EXAM:
MRI ABDOMEN WITHOUT CONTRAST  (INCLUDING MRCP)
TECHNIQUE: Multiplanar multisequence MR imaging of the abdomen was performed.
Heavily T2-weighted images of the biliary and pancreatic ducts were
obtained, and three-dimensional MRCP images were rendered by post
processing.

[Series 5: cor tru fisp · coronal · 4.0mm · 0.74mm/px · 4 of 45 slices shown]
[im 1/45]
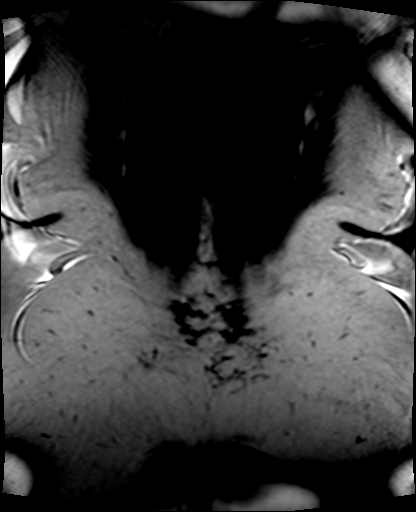
[im 15/45]
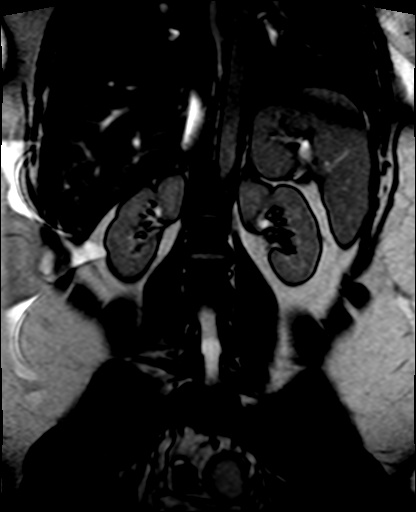
[im 30/45]
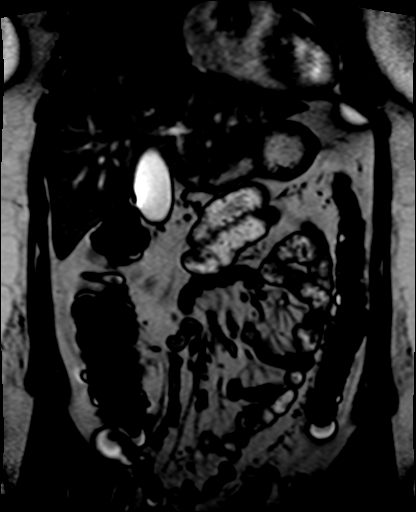
[im 45/45]
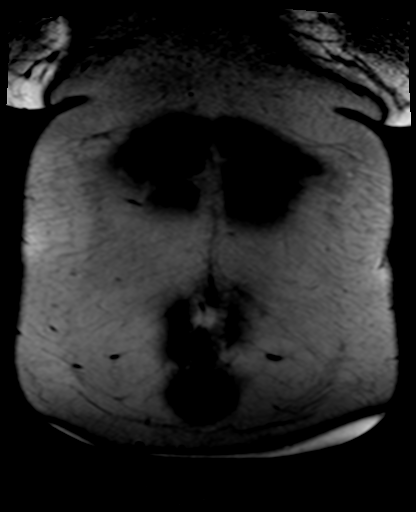

[Series 6: T2 fat-sat · axial · 7.0mm · 0.74mm/px · z∈[-27,+175]mm · 2 of 25 slices shown]
[im 1/25]
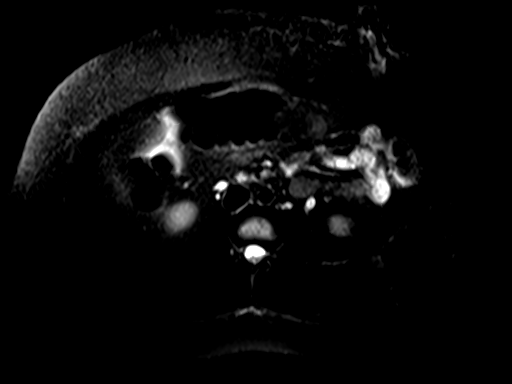
[im 25/25]
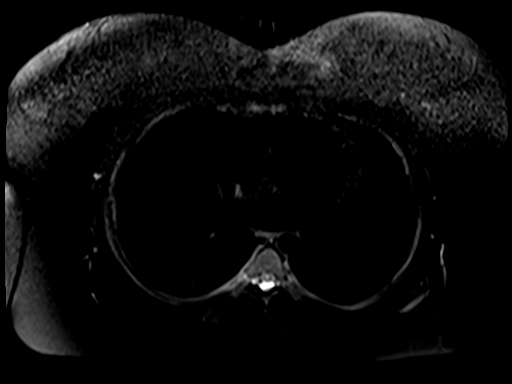

[Series 7: T2 · axial · 7.0mm · 0.74mm/px · z∈[-27,+175]mm · 3 of 25 slices shown]
[im 1/25]
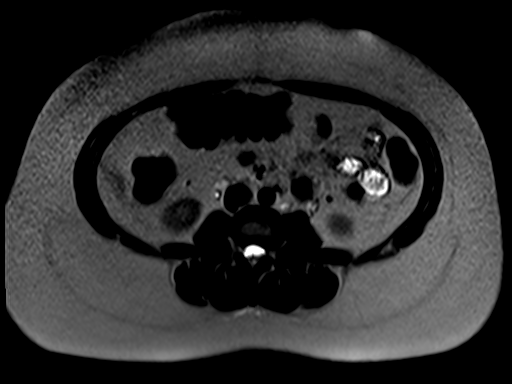
[im 13/25]
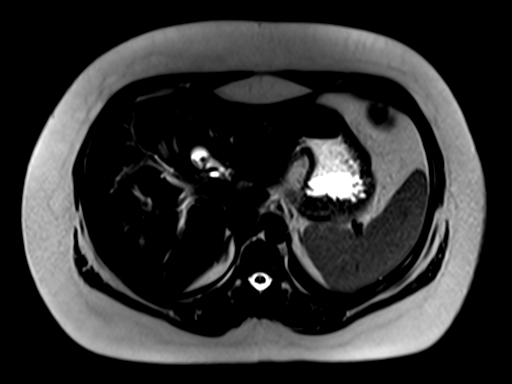
[im 25/25]
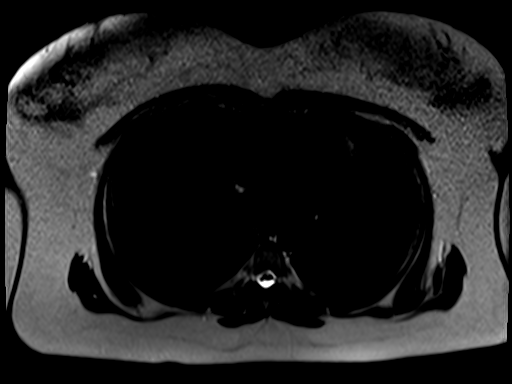

[Series 9: ax dual echo · axial · 7.0mm · 0.74mm/px · z∈[-40,+178]mm · 6 of 54 slices shown]
[im 1/54]
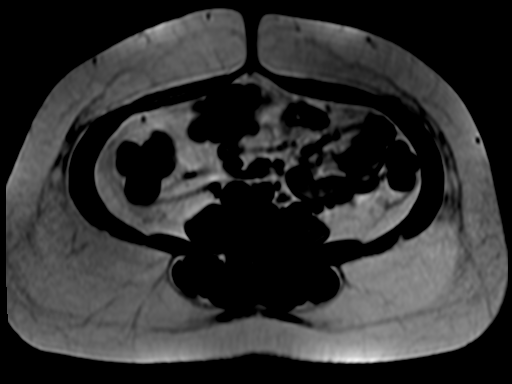
[im 11/54]
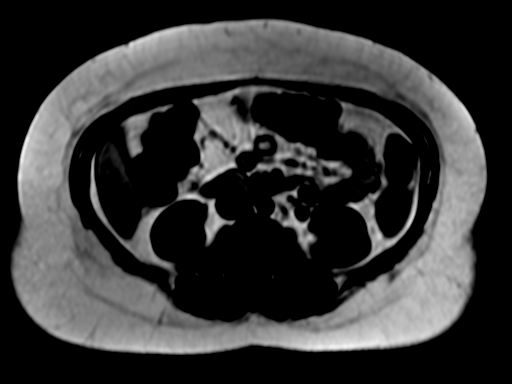
[im 22/54]
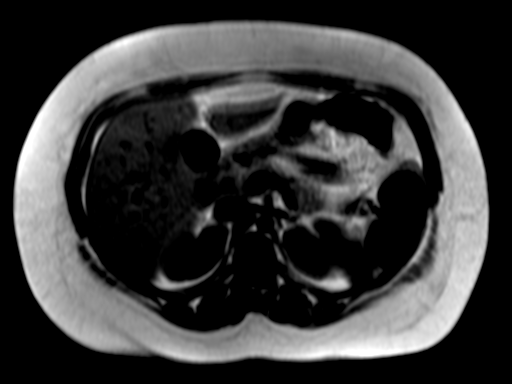
[im 32/54]
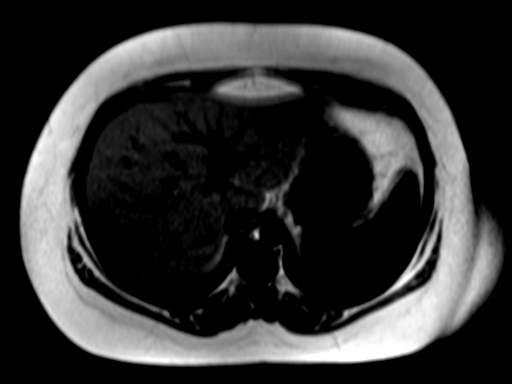
[im 43/54]
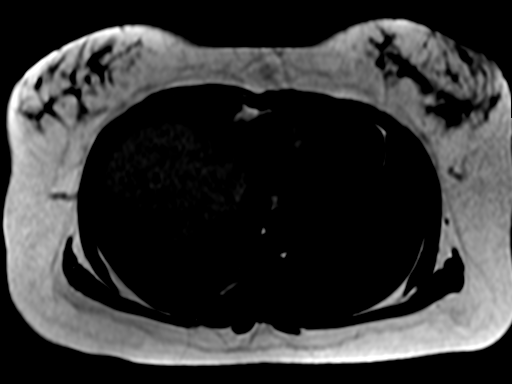
[im 54/54]
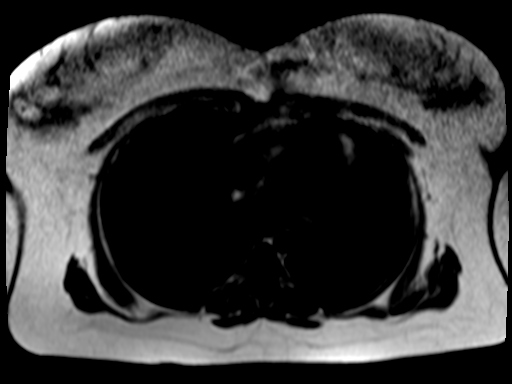

[Series 13: cor thins · coronal · 4.0mm · 0.89mm/px · 2 of 15 slices shown]
[im 1/15]
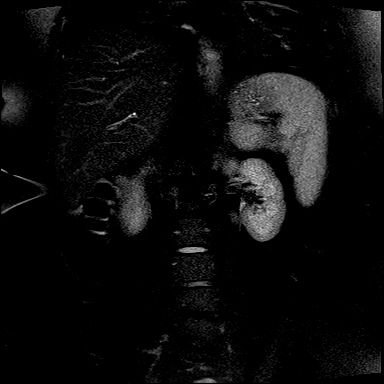
[im 15/15]
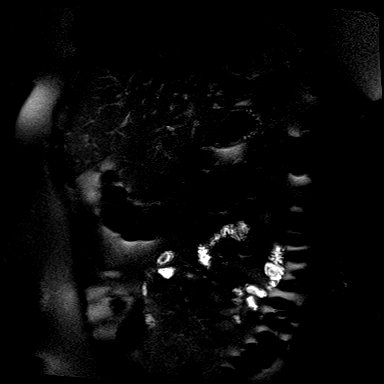

[Series 14: MRCP · coronal · 40.0mm · 0.91mm/px · 1 of 6 slices shown]
[im 1/6]
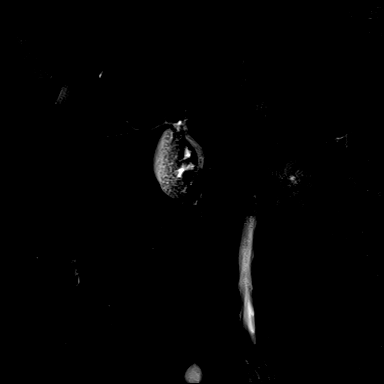

[Series 15: DWI · axial · 6.0mm · 1.98mm/px · z∈[-37,+186]mm · 8 of 96 slices shown]
[im 1/96]
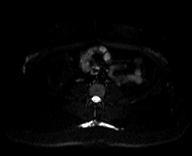
[im 20/96]
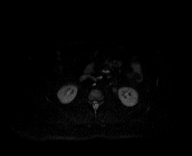
[im 29/96]
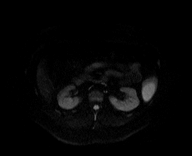
[im 39/96]
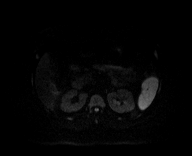
[im 58/96]
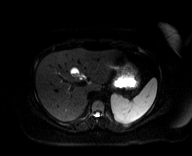
[im 67/96]
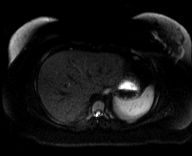
[im 77/96]
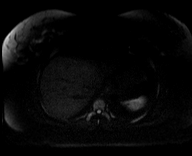
[im 96/96]
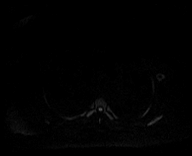

[Series 16: ax dwi_adc · axial · 6.0mm · 1.98mm/px · z∈[-37,+186]mm · 4 of 32 slices shown]
[im 1/32]
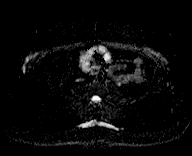
[im 11/32]
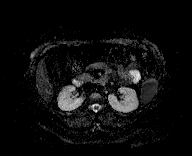
[im 21/32]
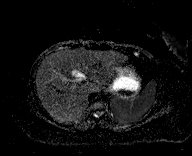
[im 32/32]
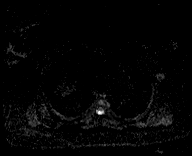

[Series 17: T1 dynamic fat-sat · axial · non-contrast · 2.5mm · 0.74mm/px · z∈[-35,+85]mm · 5 of 88 slices shown]
[im 1/88]
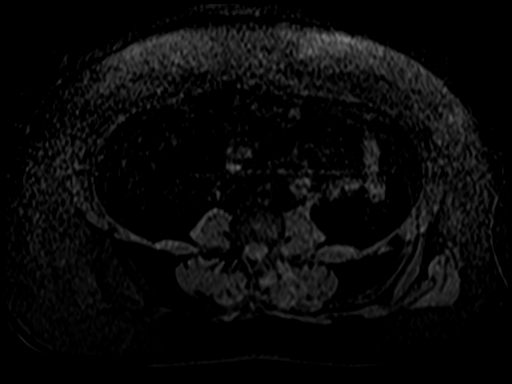
[im 10/88]
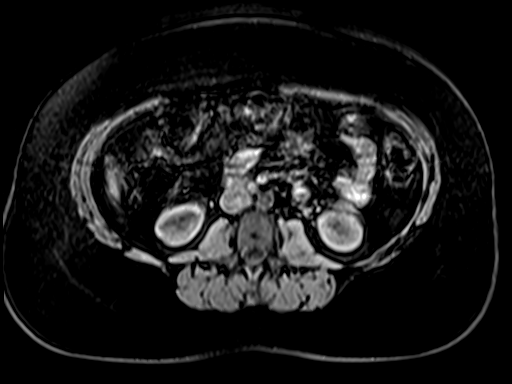
[im 30/88]
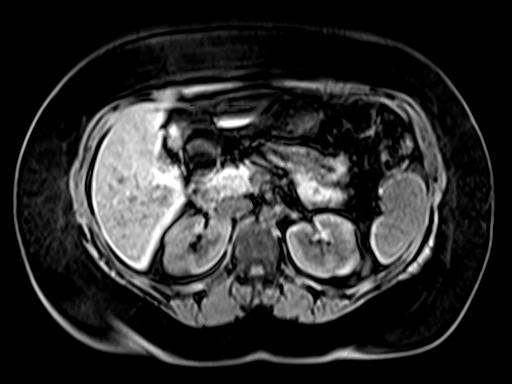
[im 39/88]
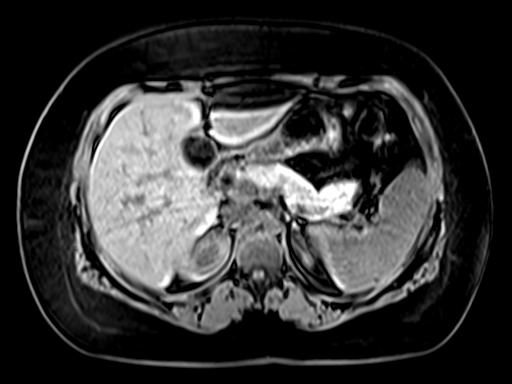
[im 49/88]
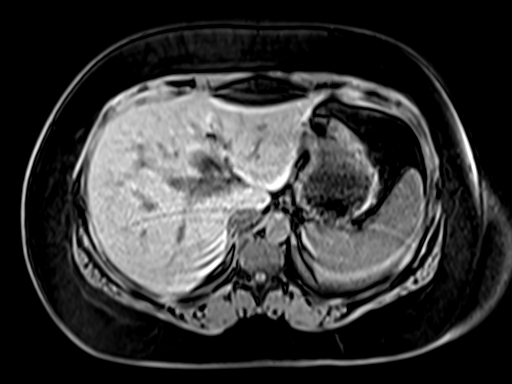

[35 of 48 positions shown; findings below may reference images not displayed]

FINDINGS: Lower chest: No acute findings.

Hepatobiliary: Homogeneous liver parenchyma without focal mass
lesion. Cholelithiasis with multiple small stones layering in the
gallbladder. No gallbladder wall thickening or inflammatory changes.
Stones are demonstrated in the cystic duct. There is no intra or
extrahepatic bile duct dilatation. No filling defects in the bile
ducts.

Pancreas: No mass, inflammatory changes, or other parenchymal
abnormality identified.

Spleen:  Within normal limits in size and appearance.

Adrenals/Urinary Tract: No masses identified. No evidence of
hydronephrosis.

Stomach/Bowel: Visualized portions within the abdomen are
unremarkable.

Vascular/Lymphatic: No pathologically enlarged lymph nodes
identified. No abdominal aortic aneurysm demonstrated.

Other:  No free air or free fluid indicated in the abdomen.

Musculoskeletal: No suspicious bone lesions identified.
IMPRESSION: 1. Cholelithiasis with multiple stones in the gallbladder and cystic
duct. No inflammatory changes to suggest cholecystitis.
2. Normal caliber intra and extrahepatic bile ducts. No
choledocholithiasis.

## 2019-05-15 ENCOUNTER — Other Ambulatory Visit: Payer: Self-pay | Admitting: *Deleted

## 2019-05-15 DIAGNOSIS — Z30011 Encounter for initial prescription of contraceptive pills: Secondary | ICD-10-CM

## 2019-05-15 MED ORDER — NORETHIN ACE-ETH ESTRAD-FE 1-20 MG-MCG PO TABS: 1.0000 | ORAL_TABLET | Freq: Every day | ORAL | 3 refills | Status: DC

## 2019-05-23 ENCOUNTER — Other Ambulatory Visit: Payer: Self-pay

## 2019-05-23 ENCOUNTER — Ambulatory Visit (INDEPENDENT_AMBULATORY_CARE_PROVIDER_SITE_OTHER): Payer: Medicaid Other | Admitting: Advanced Practice Midwife

## 2019-05-23 ENCOUNTER — Other Ambulatory Visit (HOSPITAL_COMMUNITY)
Admission: RE | Admit: 2019-05-23 | Discharge: 2019-05-23 | Disposition: A | Discharge status: Home / Self Care | Payer: Medicaid Other | Source: Ambulatory Visit | Attending: Advanced Practice Midwife | Admitting: Advanced Practice Midwife

## 2019-05-23 ENCOUNTER — Encounter: Payer: Self-pay | Admitting: Advanced Practice Midwife

## 2019-05-23 VITALS — BP 117/75 | HR 72 | Wt 160.0 lb

## 2019-05-23 DIAGNOSIS — Z0000 Encounter for general adult medical examination without abnormal findings: Secondary | ICD-10-CM

## 2019-05-23 DIAGNOSIS — N898 Other specified noninflammatory disorders of vagina: Secondary | ICD-10-CM | POA: Diagnosis present

## 2019-05-23 DIAGNOSIS — Z01419 Encounter for gynecological examination (general) (routine) without abnormal findings: Secondary | ICD-10-CM | POA: Diagnosis present

## 2019-05-23 MED ORDER — NORETHIN ACE-ETH ESTRAD-FE 1-20 MG-MCG PO TABS: 1.0000 | ORAL_TABLET | Freq: Every day | ORAL | 11 refills | Status: DC

## 2019-05-23 MED ORDER — ALPRAZOLAM 0.5 MG PO TABS: 0.5000 mg | ORAL_TABLET | Freq: Every evening | ORAL | 1 refills | Status: DC | PRN

## 2019-05-23 NOTE — Progress Notes (Signed)
Does not want Sti testing today Last pap 04/2018-normal

## 2019-05-23 NOTE — Addendum Note (Signed)
Addended by: Phillip Heal, Abubakr Wieman A on: 05/23/2019 10:32 AM   Modules accepted: Orders

## 2019-05-23 NOTE — Progress Notes (Signed)
Subjective:     Yvonne Hood is a 23 y.o. female here for a routine exam.  Current complaints: depression/anxiety.   Patient is doing well with no focal complaints. She currently is using Junel as contraception and has no complaints or side effects. She is not currently sexually active.   Patient does note some social stressors. 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 She has been prescribed Xanax but reports difficulty obtaining refills due to PCP "losing their license". She requests a short term medication to aid in her stress/anxiety till she can see her PCP next month (Jan 2021).    Gynecologic History Patient's last menstrual period was 05/14/2019. Contraception: OCP (estrogen/progesterone) Last Pap: 05/10/2018. Results were: normal Last mammogram: none .   Obstetric History OB History  Gravida Para Term Preterm AB Living  1 1 1  0 0 1  SAB TAB Ectopic Multiple Live Births  0 0 0 0 1    # Outcome Date GA Lbr Len/2nd Weight Sex Delivery Anes PTL Lv  1 Term 07/07/15 [redacted]w[redacted]d 32:48 / 00:02 7 lb 8.3 oz (3.41 kg) M Vag-Spont EPI  LIV     The following portions of the patient's history were reviewed and updated as appropriate: allergies, current medications, past family history, past medical history, past social history, past surgical history and problem list.  Review of Systems Pertinent items are noted in HPI.    Objective:    BP 117/75   Pulse 72   Wt 160 lb (72.6 kg)   LMP 05/14/2019   BMI 32.32 kg/m   General Appearance:    Alert, cooperative, no distress, appears stated age  Head:    Normocephalic, without obvious abnormality, atraumatic  Eyes:    PERRL, conjunctiva/corneas clear, EOM's intact, fundi    benign, both eyes  Ears:    Normal TM's and external ear canals, both ears  Nose:   Nares normal, septum midline, mucosa normal, no drainage    or sinus tenderness  Throat:   Lips, mucosa, and tongue normal; teeth and  gums normal  Neck:   Supple, symmetrical, trachea midline, no adenopathy;    thyroid:  no enlargement/tenderness/nodules; no carotid   bruit or JVD  Back:     Symmetric, no curvature, ROM normal, no CVA tenderness  Lungs:     Clear to auscultation bilaterally, respirations unlabored  Chest Wall:    No tenderness or deformity   Heart:    Regular rate and rhythm, S1 and S2 normal, no murmur, rub   or gallop  Breast Exam:    No tenderness, masses, or nipple abnormality  Abdomen:     Soft, non-tender, bowel sounds active all four quadrants,    no masses, no organomegaly  Genitalia:    Normal female without lesion, or tenderness. Mild discharge noted   Rectal:    Normal tone, normal prostate, no masses or tenderness;   guaiac negative stool  Extremities:   Extremities normal, atraumatic, no cyanosis or edema  Pulses:   2+ and symmetric all extremities  Skin:   Skin color, texture, turgor normal, no rashes or lesions  Lymph nodes:   Cervical, supraclavicular, and axillary nodes normal  Neurologic:   CNII-XII intact, normal strength, sensation and reflexes    throughout          Assessment/Plan:   1. Well woman exam with routine gynecological exam Patient doing well. Last pap smear November 2019. Results were normal.  No family of history of breast/ovarian/endometrial cancer. Discussed preventative care and educated patient how to perform manual breast exams.  - Cytology - PAP( [place]) - CBC - Basic metabolic panel - HgB A1c - Vitamin D (25 hydroxy)   2. Screening examination for STD (sexually transmitted disease) Patient not concerned for STD at this time. Has not been sexually active int the past 6 months. No prior hx of STD per patient  3. Surveillance of contraception Patient currently using Junel combined OCP. She has been using this medications for years and has no focal complaints - Continue Junel daily   4. Hx of Depression/Anxiety Patient noting increased social  situation as she has assumed primary caretaker role for grandmother/mother/father as well as son. This has previously been treated with Xanax by her PCP without and complications. She notes that her PCP was unable to write for the prescription as previous visit. She plans to to see her PCP next month to resolve this issue -1 time Xanax (.5 mg) and instructions to follow up with PCP   Follow up in: 12 months or as needed.   Lora Havens, Medical Student 9:35 AM   I confirm that I have verified the information documented in the medical student's note and that I was present and involved in the history, physical exam and all medical decision making activities of this service and have verified that all service and findings are accurately documented in this student's note.   --Discussed with patient that credentialing to be Medicaid PCP would typically allow that person to write for Xanax refill. Patient given to refills to allow her to continue medication until her states appointment with her New PCP at end of January. Emphasized to patient that I will declined future refill requests.  Total visit time: 30 minutes. Greater than 50% of visit spent in counseling and coordination of care.  Clayton Bibles, MSN, CNM Certified Nurse Midwife, [facility] for [employer], [facility] 05/23/19 9:56 AM

## 2019-05-24 LAB — CERVICOVAGINAL ANCILLARY ONLY
Bacterial Vaginitis (gardnerella): POSITIVE — AB
Candida Glabrata: NEGATIVE
Candida Vaginitis: NEGATIVE
Chlamydia: NEGATIVE
Comment: NEGATIVE
Comment: NEGATIVE
Comment: NEGATIVE
Comment: NEGATIVE
Comment: NEGATIVE
Comment: NORMAL
Neisseria Gonorrhea: NEGATIVE
Trichomonas: NEGATIVE

## 2019-05-24 LAB — BASIC METABOLIC PANEL
BUN/Creatinine Ratio: 24 — ABNORMAL HIGH (ref 9–23)
BUN: 19 mg/dL (ref 6–20)
CO2: 21 mmol/L (ref 20–29)
Calcium: 9.7 mg/dL (ref 8.7–10.2)
Chloride: 101 mmol/L (ref 96–106)
Creatinine, Ser: 0.78 mg/dL (ref 0.57–1.00)
GFR calc Af Amer: 123 mL/min/{1.73_m2} (ref >59)
GFR calc non Af Amer: 107 mL/min/{1.73_m2} (ref >59)
Glucose: 87 mg/dL (ref 65–99)
Potassium: 4.3 mmol/L (ref 3.5–5.2)
Sodium: 139 mmol/L (ref 134–144)

## 2019-05-24 LAB — CBC
Hematocrit: 45.1 % (ref 34.0–46.6)
Hemoglobin: 15.1 g/dL (ref 11.1–15.9)
MCH: 29.8 pg (ref 26.6–33.0)
MCHC: 33.5 g/dL (ref 31.5–35.7)
MCV: 89 fL (ref 79–97)
Platelets: 414 10*3/uL (ref 150–450)
RBC: 5.06 x10E6/uL (ref 3.77–5.28)
RDW: 12.1 % (ref 11.7–15.4)
WBC: 9.9 10*3/uL (ref 3.4–10.8)

## 2019-05-24 LAB — VITAMIN D 25 HYDROXY (VIT D DEFICIENCY, FRACTURES): Vit D, 25-Hydroxy: 24.7 ng/mL — ABNORMAL LOW (ref 30.0–100.0)

## 2019-05-24 LAB — HEMOGLOBIN A1C
Est. average glucose Bld gHb Est-mCnc: 100 mg/dL
Hgb A1c MFr Bld: 5.1 % (ref 4.8–5.6)

## 2019-05-26 ENCOUNTER — Telehealth: Payer: Self-pay | Admitting: Advanced Practice Midwife

## 2019-05-26 NOTE — Telephone Encounter (Signed)
Called to discuss swab results. Patient's mom answered the phone. Left callback name and number  Mallie Snooks, MSN, CNM Certified Nurse Midwife, [facility] for [employer], [place] [facility] 05/26/19 12:42 PM

## 2019-05-28 LAB — CYTOLOGY - PAP
Comment: NEGATIVE
Comment: NEGATIVE
HPV 16: NEGATIVE
HPV 18 / 45: NEGATIVE
High risk HPV: POSITIVE — AB

## 2019-05-29 ENCOUNTER — Telehealth: Payer: Self-pay | Admitting: Advanced Practice Midwife

## 2019-05-29 NOTE — Telephone Encounter (Signed)
Called to discuss pap results. Spoke with patient's grandmother, who passed phone to patient's mother. Patient's mother said patient was unavailable and asked for callback number.   Mallie Snooks, MSN, CNM Certified Nurse Midwife, [employer] for [employer], [place] [facility] 05/29/19 3:41 PM

## 2019-05-30 ENCOUNTER — Telehealth: Payer: Self-pay | Admitting: *Deleted

## 2019-05-30 NOTE — Telephone Encounter (Signed)
-----   Message from Darlina Rumpf, [place] sent at 05/29/2019  3:43 PM EST ----- + HRHPV, LSIL, per [facility] needs repeat cotest in one year. See phone encounter note

## 2019-05-30 NOTE — Telephone Encounter (Signed)
Pt informed of pap results and recommendations. Pt verbalizes and understands.

## 2019-11-12 ENCOUNTER — Emergency Department
Admission: EM | Admit: 2019-11-12 | Discharge: 2019-11-12 | Disposition: A | Discharge status: Home / Self Care | Payer: Medicaid Other | Attending: Student in an Organized Health Care Education/Training Program | Admitting: Student in an Organized Health Care Education/Training Program

## 2019-11-12 DIAGNOSIS — H7291 Unspecified perforation of tympanic membrane, right ear: Secondary | ICD-10-CM | POA: Diagnosis not present

## 2019-11-12 DIAGNOSIS — Z79899 Other long term (current) drug therapy: Secondary | ICD-10-CM | POA: Diagnosis not present

## 2019-11-12 DIAGNOSIS — J449 Chronic obstructive pulmonary disease, unspecified: Secondary | ICD-10-CM | POA: Diagnosis not present

## 2019-11-12 DIAGNOSIS — H9201 Otalgia, right ear: Secondary | ICD-10-CM | POA: Diagnosis present

## 2019-11-12 MED ORDER — CIPROFLOXACIN HCL 0.3 % OP SOLN: 2.0000 [drp] | Freq: Two times a day (BID) | OPHTHALMIC | 0 refills | Status: AC

## 2019-11-12 MED ORDER — CIPROFLOXACIN HCL 0.3 % OP SOLN: 2.0000 [drp] | Freq: Two times a day (BID) | OPHTHALMIC | 0 refills | Status: DC

## 2019-11-12 MED ORDER — HYDROCODONE-ACETAMINOPHEN 5-325 MG PO TABS
1.0000 | ORAL_TABLET | Freq: Once | ORAL | Status: DC
  Filled 2019-11-12: qty 1

## 2019-11-12 NOTE — ED Notes (Signed)
Pt updated on provider delay 

## 2019-11-12 NOTE — ED Provider Notes (Signed)
[facility] Emergency Department Provider Note    First MD Initiated Contact with Patient 11/12/19 [phone]     (approximate)  I have reviewed the triage vital signs and the nursing notes.   HISTORY  Chief Complaint Otalgia    HPI Yvonne Hood is a 24 y.o. female presents to the ER for evaluation of right-sided ear pain and hearing loss for the past several days.  Patient was cleaning her ear out with a Q-tip when her child was playing around hit her arm jabbing a Q-tip into her ear.  She was able to remove the complete Q-tip.  Did have sudden pain and some bleeding related to that injury.  Started having worsening pain swelling and some yellow drainage.  No measured fevers.  Denies any sore throat.  No numbness or tingling.    Past Medical History:  Diagnosis Date  . Headache(784.0)   . Hearing loss    Bilateral aids   Family History  Problem Relation Age of Onset  . Diabetes Mother   . COPD Mother   . Heart disease Father    Past Surgical History:  Procedure Laterality Date  . CHOLECYSTECTOMY N/A 01/04/2018   Procedure: LAPAROSCOPIC CHOLECYSTECTOMY;  Surgeon: Carolan Shiver, MD;  Location: [facility] ORS;  Service: General;  Laterality: N/A;  . INNER EAR SURGERY     left ear   Patient Active Problem List   Diagnosis Date Noted  . Anxiety 05/10/2018  . Cholelithiasis 01/04/2018  . Headache, rebound 02/11/2015  . Rubella non-immune status, antepartum 12/19/2014  . Migraine without aura 04/25/2012      Prior to Admission medications   Medication Sig Start Date End Date Taking? Authorizing Provider  ALPRAZolam Prudy Feeler) 0.5 MG tablet Take 1 tablet (0.5 mg total) by mouth at bedtime as needed for anxiety. 05/23/19   Calvert Cantor, CNM  ciprofloxacin (CILOXAN) 0.3 % ophthalmic solution Place 2 drops into the right ear in the morning and at bedtime for 7 days. Administer 2 drop, every 12 hours, while awake, for 7 days. 11/12/19 11/19/19   Willy Eddy, MD  norethindrone-ethinyl estradiol (JUNEL FE 1/20) 1-20 MG-MCG tablet Take 1 tablet by mouth daily. 05/23/19   Calvert Cantor, CNM  ondansetron (ZOFRAN ODT) 4 MG disintegrating tablet Take 1 tablet (4 mg total) by mouth every 8 (eight) hours as needed for nausea or vomiting. Patient not taking: Reported on 05/10/2018 05/03/18   Nita Sickle, MD  oxyCODONE-acetaminophen (PERCOCET/ROXICET) 5-325 MG tablet Take 1 tablet by mouth every 4 (four) hours as needed for severe pain. Patient not taking: Reported on 05/10/2018 01/05/18   Adrian Saran, MD    Allergies Bee venom and Ibuprofen    Social History Social History   Tobacco Use  . Smoking status: Never Smoker  . Smokeless tobacco: Never Used  Substance Use Topics  . Alcohol use: No  . Drug use: No    Review of Systems Patient denies headaches, rhinorrhea, blurry vision, numbness, shortness of breath, chest pain, edema, cough, abdominal pain, nausea, vomiting, diarrhea, dysuria, fevers, rashes or hallucinations unless otherwise stated above in HPI. ____________________________________________   PHYSICAL EXAM:  VITAL SIGNS: Vitals:   11/12/19 0354 11/12/19 0724  BP: 111/67 117/71  Pulse: (!) 101 97  Resp: 20 14  Temp: 99 F (37.2 C)   SpO2: 97% 98%    Constitutional: Alert and oriented.  Eyes: Conjunctivae are normal.  Head: Atraumatic.  Left TM and external ear canal is normal appearing.  Right TM with perforation.  No purulence noted there is some edema in the external auditory canal.  No erythema.  No mastoid tenderness.  Does have some tender posterior auricular lymph nodes but they are fairly small. Nose: No congestion/rhinnorhea. Mouth/Throat: Mucous membranes are moist.   Neck: No stridor. Painless ROM.  Cardiovascular: Normal rate, regular rhythm. Grossly normal heart sounds.  Good peripheral circulation. Respiratory: Normal respiratory effort.  No retractions. Lungs  CTAB. Gastrointestinal: Soft and nontender. No distention. No abdominal bruits. No CVA tenderness. Genitourinary:  Musculoskeletal: No lower extremity tenderness nor edema.  No joint effusions. Neurologic:  Normal speech and language. No gross focal neurologic deficits are appreciated. No facial droop Skin:  Skin is warm, dry and intact. No rash noted. Psychiatric: Mood and affect are normal. Speech and behavior are normal.  ____________________________________________   LABS (all labs ordered are listed, but only abnormal results are displayed)  No results found for this or any previous visit (from the past 24 hour(s)). ____________________________________________ _________________________________  RADIOLOGY   ____________________________________________   PROCEDURES  Procedure(s) performed:  Procedures    Critical Care performed: no ____________________________________________   INITIAL IMPRESSION / ASSESSMENT AND PLAN / ED COURSE  Pertinent labs & imaging results that were available during my care of the patient were reviewed by me and considered in my medical decision making (see chart for details).   DDX: aom, aoe, ruptured TM,  mastoiditis  Yvonne Hood is a 24 y.o. who presents to the ED with symptoms as described above.  Patient has evidence of ruptured TM with otitis.  No mastoid tenderness not consistent with mastoiditis.  She is otherwise well and nontoxic-appearing.  Will start antibiotics and give referral for ENT.  We discussed signs and symptoms for which the patient should return to the ER.  Have discussed with the patient and available family all diagnostics and treatments performed thus far and all questions were answered to the best of my ability. The patient demonstrates understanding and agreement with plan.      The patient was evaluated in Emergency Department today for the symptoms described in the history of present illness. He/she was  evaluated in the context of the global COVID-19 pandemic, which necessitated consideration that the patient might be at risk for infection with the SARS-CoV-2 virus that causes COVID-19. Institutional protocols and algorithms that pertain to the evaluation of patients at risk for COVID-19 are in a state of rapid change based on information released by regulatory bodies including the CDC and federal and state organizations. These policies and algorithms were followed during the patient's care in the ED.  As part of my medical decision making, I reviewed the following data within the electronic MEDICAL RECORD NUMBER Nursing notes reviewed and incorporated, Labs reviewed, notes from prior ED visits and [place] Controlled Substance Database   ____________________________________________   FINAL CLINICAL IMPRESSION(S) / ED DIAGNOSES  Final diagnoses:  Otalgia of right ear  Ruptured tympanic membrane, right      NEW MEDICATIONS STARTED DURING THIS VISIT:  Current Discharge Medication List    START taking these medications   Details  ciprofloxacin (CILOXAN) 0.3 % ophthalmic solution Place 2 drops into the right ear in the morning and at bedtime for 7 days. Administer 2 drop, every 12 hours, while awake, for 7 days. Qty: 5 mL, Refills: 0         Note:  This document was prepared using Dragon voice recognition software and may include unintentional dictation errors.  Merlyn Lot, MD 11/12/19 [phone]

## 2019-11-12 NOTE — ED Notes (Signed)
NAD noted at time of D/C. Pt denies questions or concerns. Pt ambulatory to the lobby at this time.  

## 2019-11-12 NOTE — ED Notes (Signed)
Pt reports injury occurred Thursday, with bleeding afterwards until Saturday. Pt reports yellow drainage since then

## 2019-11-12 NOTE — ED Notes (Signed)
Pt presents with mild swelling to right side of jaw/neck  Pt reports nausea, dizziness

## 2019-11-12 NOTE — ED Triage Notes (Signed)
Patient presents with right sided ear pain, drainage and facial swelling. States she was using a Qtip and her son jumped off the bed onto her and jammed it down into her ear.

## 2020-04-11 ENCOUNTER — Encounter: Payer: Self-pay | Admitting: Radiology

## 2020-04-17 IMAGING — US US ABDOMEN LIMITED
1 series · 14 of 25 positions shown · non-contrast
Comparison: 12/29/2017

CLINICAL DATA: Intermittent right upper quadrant pain since
[REDACTED]. Elevated liver function studies. Known gallstones.

EXAM:
ULTRASOUND ABDOMEN LIMITED RIGHT UPPER QUADRANT

[Series 1: us abdomen limited · 0.26mm/px · 14 of 42 slices shown]
[im 1/42]
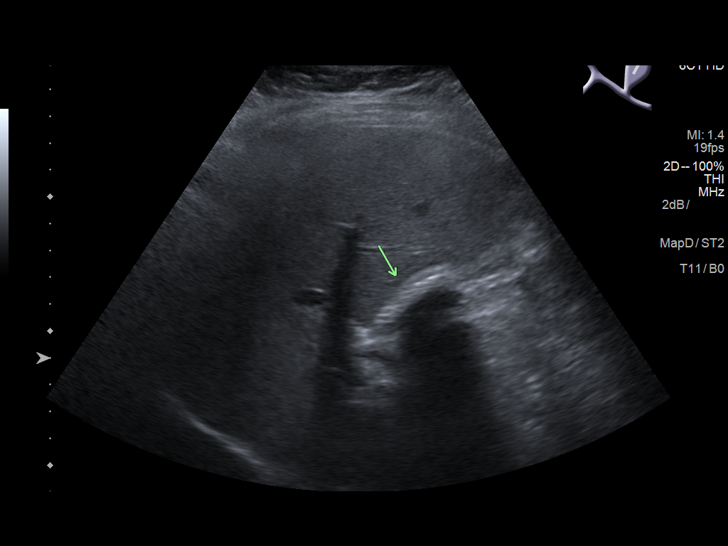
[im 4/42]
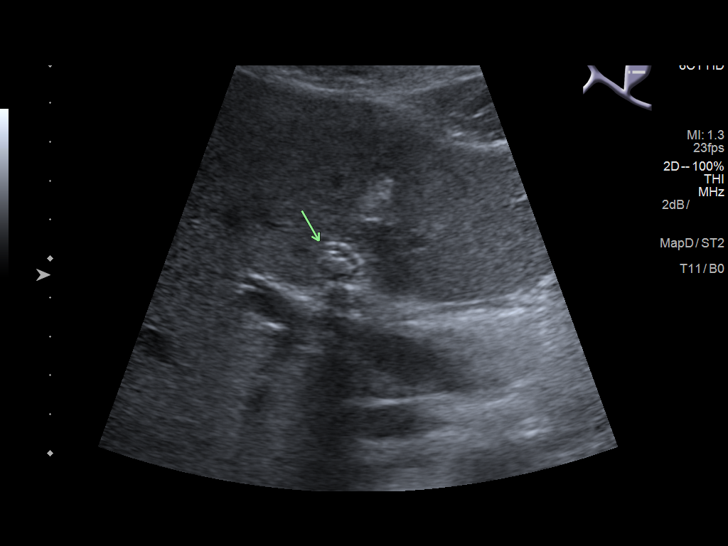
[im 7/42]
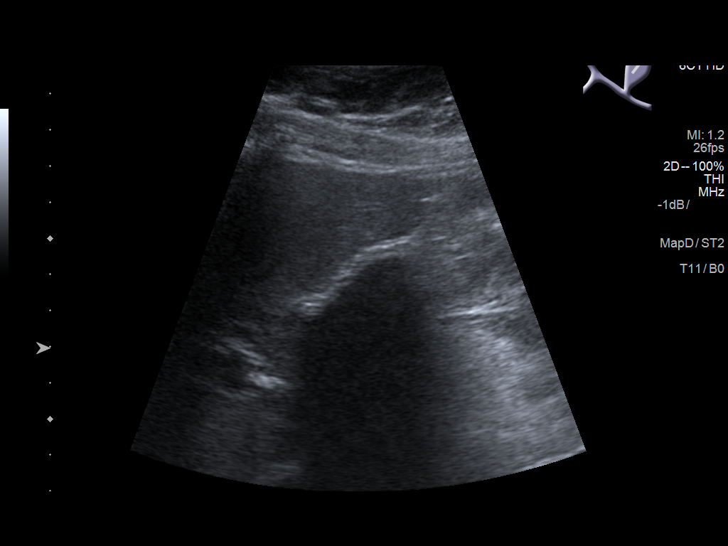
[im 11/42]
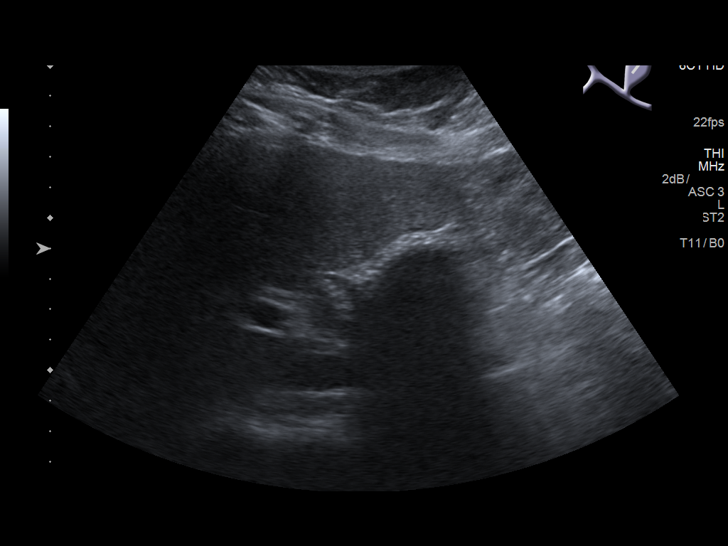
[im 14/42]
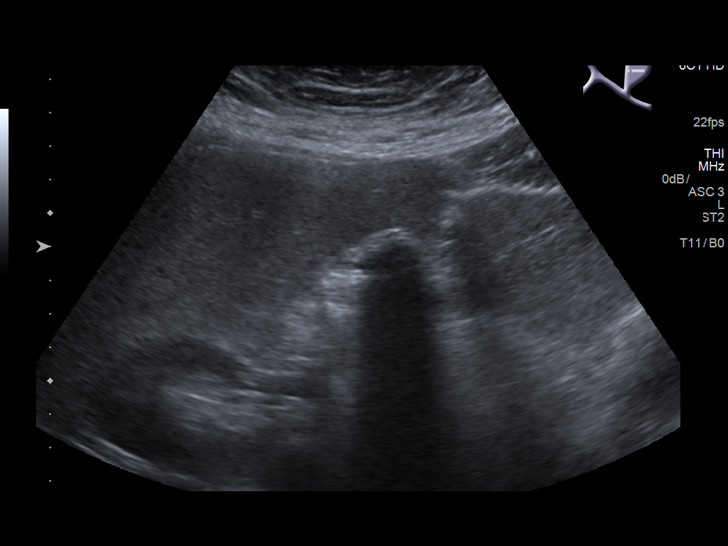
[im 16/42]
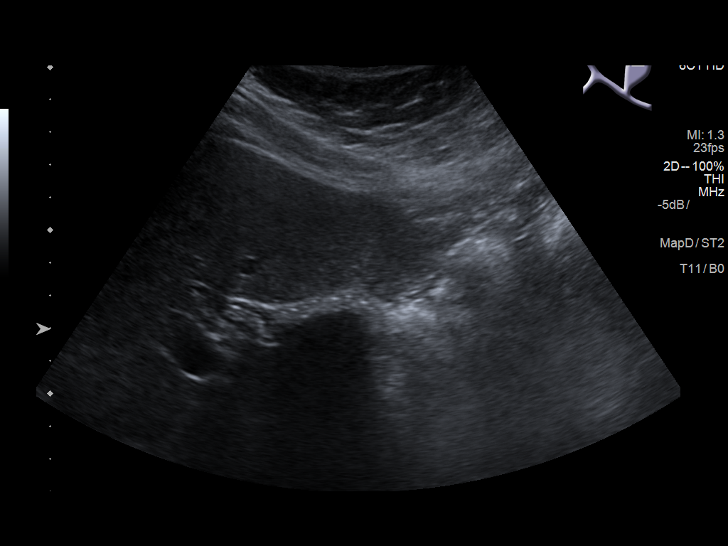
[im 19/42]
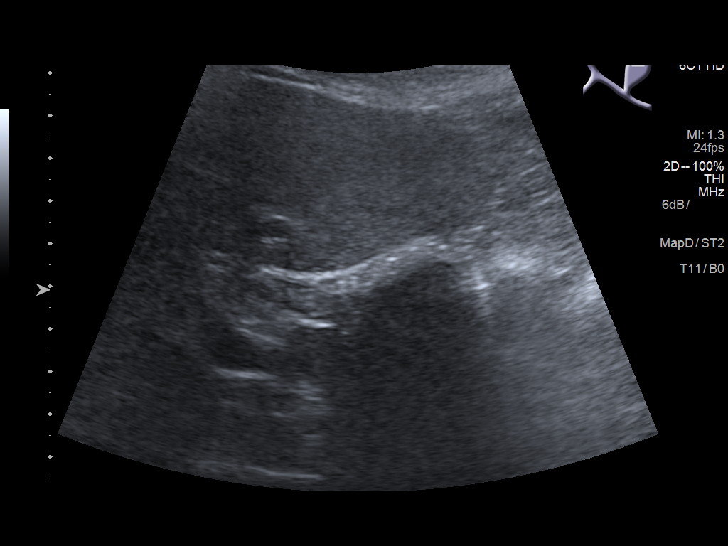
[im 23/42]
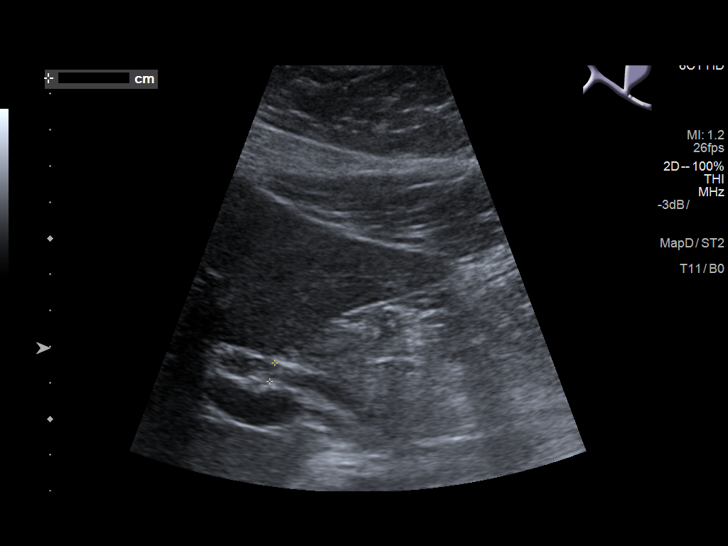
[im 26/42]
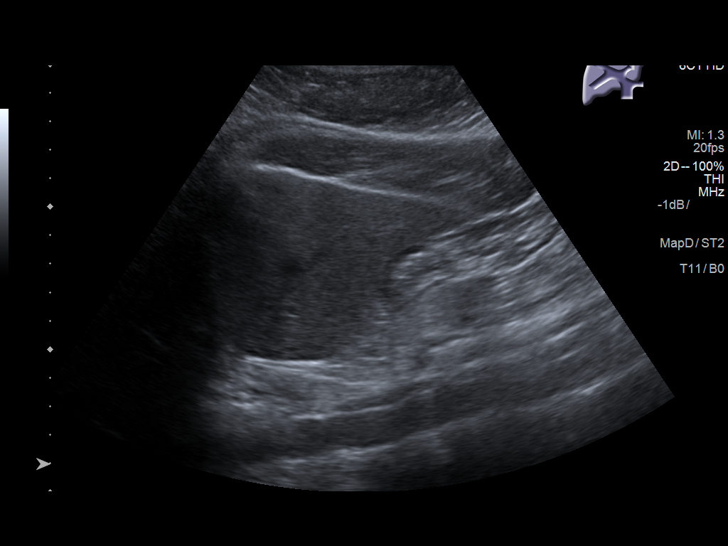
[im 28/42]
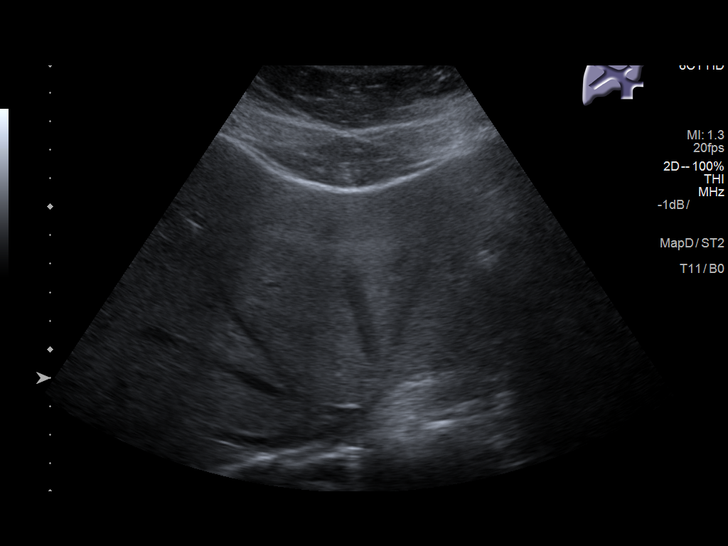
[im 31/42]
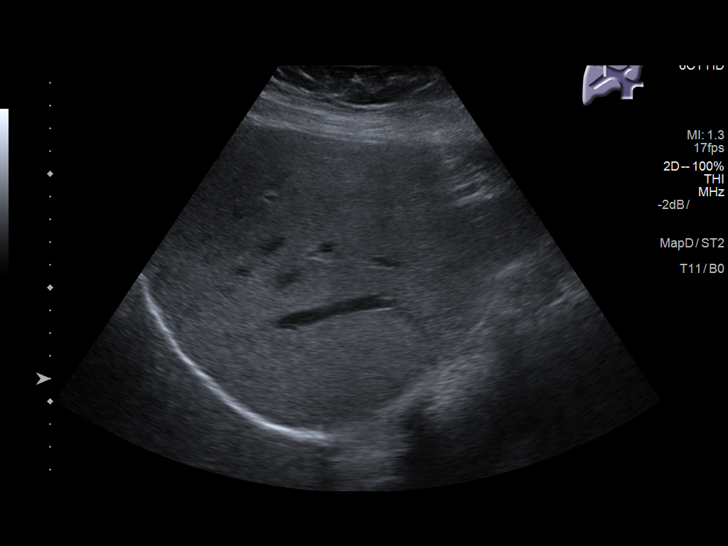
[im 35/42]
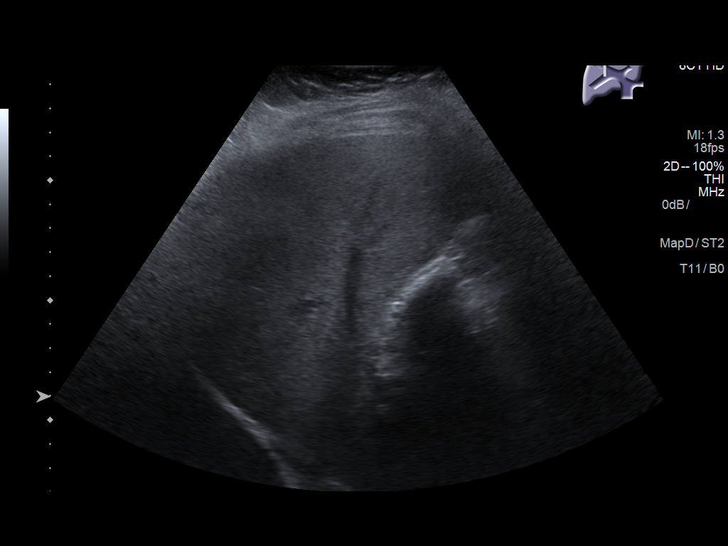
[im 38/42]
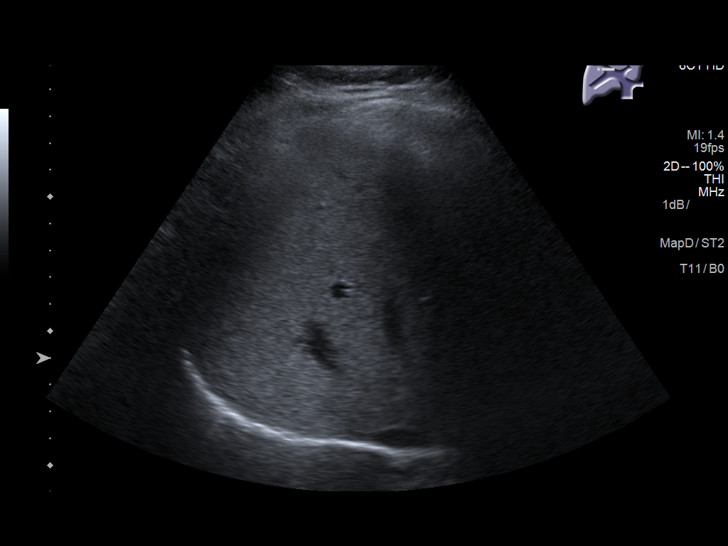
[im 42/42]
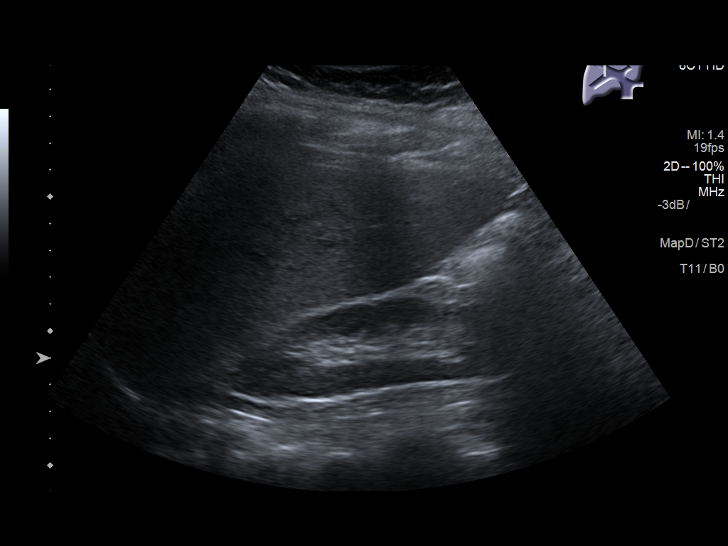

[14 of 25 positions shown; findings below may reference images not displayed]

FINDINGS: Gallbladder:

Contracted gallbladder. Cholelithiasis with multiple stones in the
gallbladder creating wall echo shadow complex. No gallbladder wall
thickening. Murphy's sign is negative but this is nonspecific in a
medicated patient.

Common bile duct:

Diameter: 5.3 mm, normal

Liver:

No focal lesion identified. Within normal limits in parenchymal
echogenicity. Portal vein is patent on color Doppler imaging with
normal direction of blood flow towards the liver.
IMPRESSION: Cholelithiasis. No additional changes to suggest cholecystitis.
Similar appearance to previous study.

## 2020-06-04 ENCOUNTER — Encounter: Payer: Self-pay | Admitting: Advanced Practice Midwife

## 2020-06-04 ENCOUNTER — Ambulatory Visit (INDEPENDENT_AMBULATORY_CARE_PROVIDER_SITE_OTHER): Payer: Medicaid Other | Admitting: Advanced Practice Midwife

## 2020-06-04 ENCOUNTER — Other Ambulatory Visit (HOSPITAL_COMMUNITY)
Admission: RE | Admit: 2020-06-04 | Discharge: 2020-06-04 | Disposition: A | Discharge status: Home / Self Care | Payer: Medicaid Other | Source: Ambulatory Visit | Attending: Advanced Practice Midwife | Admitting: Advanced Practice Midwife

## 2020-06-04 ENCOUNTER — Other Ambulatory Visit: Payer: Self-pay

## 2020-06-04 VITALS — BP 126/78 | HR 108 | Ht 59.0 in | Wt 179.0 lb

## 2020-06-04 DIAGNOSIS — F32A Depression, unspecified: Secondary | ICD-10-CM

## 2020-06-04 DIAGNOSIS — Z23 Encounter for immunization: Secondary | ICD-10-CM | POA: Diagnosis not present

## 2020-06-04 DIAGNOSIS — Z01419 Encounter for gynecological examination (general) (routine) without abnormal findings: Secondary | ICD-10-CM | POA: Insufficient documentation

## 2020-06-04 DIAGNOSIS — F419 Anxiety disorder, unspecified: Secondary | ICD-10-CM

## 2020-06-04 MED ORDER — NORETHIN ACE-ETH ESTRAD-FE 1-20 MG-MCG PO TABS: 1.0000 | ORAL_TABLET | Freq: Every day | ORAL | 12 refills | Status: DC

## 2020-06-04 NOTE — Progress Notes (Signed)
GYNECOLOGY ANNUAL PREVENTATIVE CARE ENCOUNTER NOTE  History:     Yvonne Hood is a 24 y.o. G4P1001 female here for a routine annual gynecologic exam.  Current complaints: none.   Denies abnormal vaginal bleeding, discharge, pelvic pain, problems with intercourse or other gynecologic concerns.  Not currently sexually active. Likes to take walks outside, relax with her child   Gynecologic History No LMP recorded. (Menstrual status: Oral contraceptives). Contraception: OCP (estrogen/progesterone) Last Pap: . Results were: LSIL with positive HPV (negative 16, negative 18/45)   Obstetric History OB History  Gravida Para Term Preterm AB Living  1 1 1  0 0 1  SAB IAB Ectopic Multiple Live Births  0 0 0 0 1    # Outcome Date GA Lbr Len/2nd Weight Sex Delivery Anes PTL Lv  1 Term 07/07/15 [redacted]w[redacted]d 32:48 / 00:02 7 lb 8.3 oz (3.41 kg) M Vag-Spont EPI  LIV    Past Medical History:  Diagnosis Date  . Headache(784.0)   . Hearing loss    Bilateral aids    Past Surgical History:  Procedure Laterality Date  . CHOLECYSTECTOMY N/A 01/04/2018   Procedure: LAPAROSCOPIC CHOLECYSTECTOMY;  Surgeon: 01/06/2018, MD;  Location: [facility] ORS;  Service: General;  Laterality: N/A;  . INNER EAR SURGERY     left ear    Current Outpatient Medications on File Prior to Visit  Medication Sig Dispense Refill  . ALPRAZolam (XANAX) 0.5 MG tablet Take 1 tablet (0.5 mg total) by mouth at bedtime as needed for anxiety. 30 tablet 1  . ondansetron (ZOFRAN ODT) 4 MG disintegrating tablet Take 1 tablet (4 mg total) by mouth every 8 (eight) hours as needed for nausea or vomiting. (Patient not taking: Reported on 05/10/2018) 20 tablet 0  . oxyCODONE-acetaminophen (PERCOCET/ROXICET) 5-325 MG tablet Take 1 tablet by mouth every 4 (four) hours as needed for severe pain. (Patient not taking: Reported on 05/10/2018) 15 tablet 0   No current facility-administered medications on file prior to visit.     Allergies  Allergen Reactions  . Bee Venom Shortness Of Breath  . Ibuprofen Hives, Swelling and Rash    Social History:  reports that she has never smoked. She has never used smokeless tobacco. She reports that she does not drink alcohol and does not use drugs.  Family History  Problem Relation Age of Onset  . Diabetes Mother   . COPD Mother   . Heart disease Father     The following portions of the patient's history were reviewed and updated as appropriate: allergies, current medications, past family history, past medical history, past social history, past surgical history and problem list.  Review of Systems Pertinent items noted in HPI and remainder of comprehensive ROS otherwise negative.  Physical Exam:  BP 126/78   Pulse (!) 108   Ht 4\' 11"  (1.499 m)   Wt 179 lb (81.2 kg)   BMI 36.15 kg/m  CONSTITUTIONAL: Well-developed, well-nourished female in no acute distress.  HENT:  Normocephalic, atraumatic, External right and left ear normal.  EYES: Conjunctivae and EOM are normal. Pupils are equal, round, and reactive to light. No scleral icterus.  NECK: Normal range of motion, supple, no masses.  Normal thyroid.  SKIN: Skin is warm and dry. No rash noted. Not diaphoretic. No erythema. No pallor. MUSCULOSKELETAL: Normal range of motion. No tenderness.  No cyanosis, clubbing, or edema.   NEUROLOGIC: Alert and oriented to person, place, and time. Normal reflexes, muscle tone coordination.  PSYCHIATRIC: Normal mood  and affect. Normal behavior. Normal judgment and thought content. CARDIOVASCULAR: Normal heart rate noted, regular rhythm RESPIRATORY: Clear to auscultation bilaterally. Effort and breath sounds normal, no problems with respiration noted. BREASTS: Symmetric in size. No masses, tenderness, skin changes, nipple drainage, or lymphadenopathy bilaterally. Performed in the presence of a chaperone. ABDOMEN: Soft, no distention noted.  No tenderness, rebound or guarding.   PELVIC: Normal appearing external genitalia and urethral meatus; normal appearing vaginal mucosa and cervix.  No abnormal discharge noted.  Pap smear obtained.  Normal uterine size, no other palpable masses, no uterine or adnexal tenderness.  Performed in the presence of a chaperone.   Assessment and Plan:    1. Well woman exam with routine gynecological exam - OCP refilled - General counseling on preventive wellness interventions, exercise - Cytology - PAP  2. Anxiety and depression - Started on Zoloft, positive impact  Will follow up results of pap smear and manage accordingly. Routine preventative health maintenance measures emphasized. Please refer to After Visit Summary for other counseling recommendations.      Clayton Bibles, MSN, CNM Certified Nurse Midwife, [employer] for [employer], [facility] 06/04/20 2:12 PM

## 2020-06-04 NOTE — Patient Instructions (Signed)
Preventive Care 21-25 Years Old, Female Preventive care refers to visits with your health care provider and lifestyle choices that can promote health and wellness. This includes:  A yearly physical exam. This may also be called an annual well check.  Regular dental visits and eye exams.  Immunizations.  Screening for certain conditions.  Healthy lifestyle choices, such as eating a healthy diet, getting regular exercise, not using drugs or products that contain nicotine and tobacco, and limiting alcohol use. What can I expect for my preventive care visit? Physical exam Your health care provider will check your:  Height and weight. This may be used to calculate body mass index (BMI), which tells if you are at a healthy weight.  Heart rate and blood pressure.  Skin for abnormal spots. Counseling Your health care provider may ask you questions about your:  Alcohol, tobacco, and drug use.  Emotional well-being.  Home and relationship well-being.  Sexual activity.  Eating habits.  Work and work environment.  Method of birth control.  Menstrual cycle.  Pregnancy history. What immunizations do I need?  Influenza (flu) vaccine  This is recommended every year. Tetanus, diphtheria, and pertussis (Tdap) vaccine  You may need a Td booster every 10 years. Varicella (chickenpox) vaccine  You may need this if you have not been vaccinated. Human papillomavirus (HPV) vaccine  If recommended by your health care provider, you may need three doses over 6 months. Measles, mumps, and rubella (MMR) vaccine  You may need at least one dose of MMR. You may also need a second dose. Meningococcal conjugate (MenACWY) vaccine  One dose is recommended if you are age 19-21 years and a first-year college student living in a residence hall, or if you have one of several medical conditions. You may also need additional booster doses. Pneumococcal conjugate (PCV13) vaccine  You may need  this if you have certain conditions and were not previously vaccinated. Pneumococcal polysaccharide (PPSV23) vaccine  You may need one or two doses if you smoke cigarettes or if you have certain conditions. Hepatitis A vaccine  You may need this if you have certain conditions or if you travel or work in places where you may be exposed to hepatitis A. Hepatitis B vaccine  You may need this if you have certain conditions or if you travel or work in places where you may be exposed to hepatitis B. Haemophilus influenzae type b (Hib) vaccine  You may need this if you have certain conditions. You may receive vaccines as individual doses or as more than one vaccine together in one shot (combination vaccines). Talk with your health care provider about the risks and benefits of combination vaccines. What tests do I need?  Blood tests  Lipid and cholesterol levels. These may be checked every 5 years starting at age 20.  Hepatitis C test.  Hepatitis B test. Screening  Diabetes screening. This is done by checking your blood sugar (glucose) after you have not eaten for a while (fasting).  Sexually transmitted disease (STD) testing.  BRCA-related cancer screening. This may be done if you have a family history of breast, ovarian, tubal, or peritoneal cancers.  Pelvic exam and Pap test. This may be done every 3 years starting at age 21. Starting at age 30, this may be done every 5 years if you have a Pap test in combination with an HPV test. Talk with your health care provider about your test results, treatment options, and if necessary, the need for more tests.   Follow these instructions at home: Eating and drinking   Eat a diet that includes fresh fruits and vegetables, whole grains, lean protein, and low-fat dairy.  Take vitamin and mineral supplements as recommended by your health care provider.  Do not drink alcohol if: ? Your health care provider tells you not to drink. ? You are  pregnant, may be pregnant, or are planning to become pregnant.  If you drink alcohol: ? Limit how much you have to 0-1 drink a day. ? Be aware of how much alcohol is in your drink. In the [place] one drink equals one 12 oz bottle of beer (355 mL), one 5 oz glass of wine (148 mL), or one 1 oz glass of hard liquor (44 mL). Lifestyle  Take daily care of your teeth and gums.  Stay active. Exercise for at least 30 minutes on 5 or more days each week.  Do not use any products that contain nicotine or tobacco, such as cigarettes, e-cigarettes, and chewing tobacco. If you need help quitting, ask your health care provider.  If you are sexually active, practice safe sex. Use a condom or other form of birth control (contraception) in order to prevent pregnancy and STIs (sexually transmitted infections). If you plan to become pregnant, see your health care provider for a preconception visit. What's next?  Visit your health care provider once a year for a well check visit.  Ask your health care provider how often you should have your eyes and teeth checked.  Stay up to date on all vaccines. This information is not intended to replace advice given to you by your health care provider. Make sure you discuss any questions you have with your health care provider. Document Revised: 02/09/2018 Document Reviewed: 02/09/2018 Elsevier Patient Education  2020 [employer].

## 2020-06-11 ENCOUNTER — Telehealth: Payer: Self-pay

## 2020-06-11 NOTE — Telephone Encounter (Signed)
Pt returning phone call to office from earlier. Pt given results and verbalizes understanding. Pt scheduled for Colpo.

## 2020-06-11 NOTE — Telephone Encounter (Signed)
-----   Message from Calvert Cantor, [place] sent at 06/10/2020  4:18 PM EST ----- Abnormal pap, discussed with Dr. Shawnie Pons, please notify and schedule for Colpo. I am in [facility] today but off tomorrow and can speak to her over the phone tomorrow if that would make her more comfortable. Thanks! SW, CNM

## 2020-06-12 LAB — CYTOLOGY - PAP
Comment: NEGATIVE
High risk HPV: POSITIVE — AB

## 2020-07-09 ENCOUNTER — Other Ambulatory Visit: Payer: Self-pay

## 2020-07-09 ENCOUNTER — Other Ambulatory Visit: Payer: Self-pay | Admitting: Family Medicine

## 2020-07-09 ENCOUNTER — Ambulatory Visit (INDEPENDENT_AMBULATORY_CARE_PROVIDER_SITE_OTHER): Payer: Medicaid Other | Admitting: Family Medicine

## 2020-07-09 ENCOUNTER — Encounter: Payer: Self-pay | Admitting: Family Medicine

## 2020-07-09 VITALS — BP 123/83 | HR 87

## 2020-07-09 DIAGNOSIS — Z01812 Encounter for preprocedural laboratory examination: Secondary | ICD-10-CM

## 2020-07-09 DIAGNOSIS — R87612 Low grade squamous intraepithelial lesion on cytologic smear of cervix (LGSIL): Secondary | ICD-10-CM

## 2020-07-09 DIAGNOSIS — R87810 Cervical high risk human papillomavirus (HPV) DNA test positive: Secondary | ICD-10-CM

## 2020-07-09 LAB — POCT URINE PREGNANCY: Preg Test, Ur: NEGATIVE

## 2020-07-09 NOTE — Progress Notes (Signed)
    GYNECOLOGY OFFICE COLPOSCOPY PROCEDURE NOTE  25 y.o. G1P1001 here for colposcopy for low-grade squamous intraepithelial neoplasia (LGSIL - encompassing HPV,mild dysplasia,CIN I) HR HPV POSITIVE but 16/18 negative pap smear on 06/04/20.  2019 pap was negative. Discussed role for HPV in cervical dysplasia, need for surveillance.  Patient gave informed written consent, time out was performed.  Placed in lithotomy position. Cervix viewed with speculum and colposcope after application of acetic acid.   Colposcopy adequate? Yes  Visible lesion(s) at 4-6 o'clock hazy white with textured/rough surface and 9 O'clock, separate acetowhite lesions; corresponding biopsies obtained.  ECC specimen obtained. All specimens were labeled and sent to pathology.  Patient was given post procedure instructions.  Will follow up pathology and manage accordingly; patient will be contacted with results and recommendations.  Routine preventative health maintenance measures emphasized.

## 2020-07-22 ENCOUNTER — Encounter: Payer: Self-pay | Admitting: Family Medicine

## 2020-07-22 DIAGNOSIS — R87619 Unspecified abnormal cytological findings in specimens from cervix uteri: Secondary | ICD-10-CM | POA: Insufficient documentation

## 2020-07-23 ENCOUNTER — Telehealth: Payer: Self-pay | Admitting: *Deleted

## 2020-07-23 NOTE — Telephone Encounter (Signed)
Called pt to inform her of colpo results and recommendations from Dr Alvester Morin. Will schedule pt and will have her discuss with that provider that day and will either do cryo or a Leep. Pt verbalizes and understands.

## 2020-07-23 NOTE — Telephone Encounter (Signed)
-----   Message from Federico Flake, MD sent at 07/22/2020  2:34 PM EST ----- CIN 1 with Focal CIN 2. Needs to be scheduled for cryo or leep

## 2020-08-26 ENCOUNTER — Encounter: Payer: Medicaid Other | Admitting: Family Medicine

## 2020-09-09 ENCOUNTER — Ambulatory Visit (INDEPENDENT_AMBULATORY_CARE_PROVIDER_SITE_OTHER): Payer: Medicaid Other | Admitting: Obstetrics and Gynecology

## 2020-09-09 ENCOUNTER — Encounter: Payer: Self-pay | Admitting: Obstetrics and Gynecology

## 2020-09-09 ENCOUNTER — Other Ambulatory Visit: Payer: Self-pay

## 2020-09-09 VITALS — BP 112/79 | HR 121 | Wt 173.0 lb

## 2020-09-09 DIAGNOSIS — N879 Dysplasia of cervix uteri, unspecified: Secondary | ICD-10-CM | POA: Diagnosis not present

## 2020-09-09 NOTE — Progress Notes (Signed)
Obstetrics and Gynecology Visit Return Patient Evaluation  Appointment Date: 09/09/2020  Primary Care Provider: [place]  OBGYN Clinic: Center for [facility]  Chief Complaint: scheduled for LEEP  History of Present Illness:  Yvonne Hood is a 25 y.o. P1 with above CC.  Patient's cervical cancer screening history:  07/09/2020 colpo (adequate): cin 1 with focal cin 2, negative ecc 05/2020 pap: LSIL, HPV+ 05/2019 pap: LSIL, HPV+ 04/2018 pap: negative 08/2015 pap: negative  Patient had colpo by Dr. Alvester Morin with above results and plan is for LEEP  Interval History: Since her last visit, she is doing well and w/o complaint.   Review of Systems:  as noted in the History of Present Illness.  Patient Active Problem List   Diagnosis Date Noted  . Abnormal Papanicolaou smear of cervix 07/22/2020  . Anxiety 05/10/2018  . Cholelithiasis 01/04/2018  . Headache, rebound 02/11/2015  . Rubella non-immune status, antepartum 12/19/2014  . Migraine without aura 04/25/2012   Medications:  Gabriel Carina had no medications administered during this visit. Current Outpatient Medications  Medication Sig Dispense Refill  . norethindrone-ethinyl estradiol (JUNEL FE 1/20) 1-20 MG-MCG tablet Take 1 tablet by mouth daily. 30 tablet 12  . ALPRAZolam (XANAX) 0.5 MG tablet Take 1 tablet (0.5 mg total) by mouth at bedtime as needed for anxiety. 30 tablet 1  . ondansetron (ZOFRAN ODT) 4 MG disintegrating tablet Take 1 tablet (4 mg total) by mouth every 8 (eight) hours as needed for nausea or vomiting. (Patient not taking: Reported on 05/10/2018) 20 tablet 0  . oxyCODONE-acetaminophen (PERCOCET/ROXICET) 5-325 MG tablet Take 1 tablet by mouth every 4 (four) hours as needed for severe pain. (Patient not taking: Reported on 05/10/2018) 15 tablet 0   No current facility-administered medications for this visit.    Allergies: is allergic to bee venom and ibuprofen.  Physical  Exam:  BP 112/79   Pulse (!) 121   Wt 173 lb (78.5 kg)   LMP 08/24/2020 (Exact Date)   BMI 34.94 kg/m  Body mass index is 34.94 kg/m. General appearance: Well nourished, well developed female in no acute distress.  Neuro/Psych:  Normal mood and affect.     Assessment: patient stable  Plan:  1. Cervical dysplasia I told her that given concerns about future pregnancy in her that I would recommend q20m surveillance with colpo/ecc/pap with hpv testing, which she is amenable to, in addition to limiting 2nd hand smoke exposure.  Importance of close surveillance discussed with her, in addition to BCCCP if insurance coverage is ever lost    RTC: 3m colpo,ecc and pap with hpv co-testing  [place] Bing, Montez Hageman MD Attending Center for [employer] [facility])

## 2020-09-29 ENCOUNTER — Ambulatory Visit (INDEPENDENT_AMBULATORY_CARE_PROVIDER_SITE_OTHER): Payer: Medicaid Other | Admitting: *Deleted

## 2020-09-29 ENCOUNTER — Other Ambulatory Visit: Payer: Self-pay

## 2020-09-29 VITALS — BP 106/73 | HR 91

## 2020-09-29 DIAGNOSIS — Z3201 Encounter for pregnancy test, result positive: Secondary | ICD-10-CM

## 2020-09-29 LAB — POCT URINE PREGNANCY: Preg Test, Ur: POSITIVE — AB

## 2020-09-29 MED ORDER — PREPLUS 27-1 MG PO TABS: 1.0000 | ORAL_TABLET | Freq: Every day | ORAL | 13 refills | Status: DC

## 2020-09-29 NOTE — Progress Notes (Signed)
Pt here today for UPT. Pt had 2 positive UPT's  at home. LMP 08/24/2020.  UPT in office-positive. Per LMP pt is 5weeks 1day.   Pt to follow up in about 5 weeks for NEW OB visit. Will send in PNV to pharmacy.

## 2020-09-30 NOTE — Progress Notes (Signed)
Patient was assessed and managed by nursing staff during this encounter. I have reviewed the chart and agree with the documentation and plan. I have also made any necessary editorial changes.  Finlee Milo, MD 09/30/2020 8:42 AM 

## 2020-11-05 ENCOUNTER — Ambulatory Visit (INDEPENDENT_AMBULATORY_CARE_PROVIDER_SITE_OTHER): Payer: Medicaid Other | Admitting: Advanced Practice Midwife

## 2020-11-05 ENCOUNTER — Other Ambulatory Visit: Payer: Self-pay

## 2020-11-05 ENCOUNTER — Encounter: Payer: Self-pay | Admitting: Advanced Practice Midwife

## 2020-11-05 VITALS — BP 120/85 | HR 87 | Wt 175.8 lb

## 2020-11-05 DIAGNOSIS — O209 Hemorrhage in early pregnancy, unspecified: Secondary | ICD-10-CM | POA: Diagnosis not present

## 2020-11-05 DIAGNOSIS — O3680X0 Pregnancy with inconclusive fetal viability, not applicable or unspecified: Secondary | ICD-10-CM | POA: Diagnosis not present

## 2020-11-05 LAB — POCT URINE PREGNANCY: Preg Test, Ur: POSITIVE — AB

## 2020-11-05 NOTE — Patient Instructions (Signed)
Vaginal Bleeding During Pregnancy, First Trimester A small amount of bleeding from the vagina is common during early pregnancy. This kind of bleeding is also called spotting. Sometimes the bleeding is normal and does not cause problems. At other times, though, bleeding may be a sign of something serious. Normal bleeding in pregnancy can happen:  When the fertilized egg attaches itself to your womb.  When blood vessels change because of the pregnancy.  When you have pelvic exams.  When you have sex. Abnormal bleeding can happen:  When you have an infection.  When you have growths in your womb. The growths are called polyps.  If you are having a miscarriage or at risk of having one.  If you have other problems in your pregnancy. Tell your doctor right away about any bleeding from your vagina. Follow these instructions at home: Watch your bleeding  Watch your condition for any changes. Let your doctor know if you are worried about something.  Try to know what causes your bleeding. Ask yourself these questions: ? Does the bleeding start on its own? ? Does the bleeding start after something is done, such as sex or a pelvic exam?  Use a diary to write the things you see about your bleeding. Write in your diary: ? If the bleeding flows freely without stopping, or if it starts and stops, and then starts again. ? If the bleeding is heavy or light. ? How many pads you use in a day and how much blood is in them.  Tell your doctor if you pass tissue. He or she may want to see it.   Activity  Follow your doctor's instructions about how active you can be. Ask what activities are safe for you.  Do not have sex or orgasms until your doctor says that this is safe.  If needed, make plans for someone to help with your normal activities. General instructions  Take over-the-counter and prescription medicines only as told by your doctor.  Do not take aspirin because it can cause  bleeding.  Do not use tampons.  Do not douche.  Keep all follow-up visits. Contact a doctor if:  You have vaginal bleeding at any time while you are pregnant.  You have cramps.  You have a fever or chills. Get help right away if:  You have very bad cramps in your back or belly (abdomen).  You pass large clots or a lot of tissue from your vagina.  Your bleeding gets worse.  You feel light-headed.  You feel weak.  You pass out (faint).  You have chills.  You are leaking fluid from your vagina.  You have a gush of fluid from your vagina. Summary  Sometimes vaginal bleeding during pregnancy is normal and does not cause problems. At other times, bleeding may be a sign of something serious.  Tell your doctor right away about any bleeding from your vagina.  Follow your doctor's instructions about how active you can be. You may need someone to help you with your normal activities.  Keep all follow-up visits. This information is not intended to replace advice given to you by your health care provider. Make sure you discuss any questions you have with your health care provider. Document Revised: 02/21/2020 Document Reviewed: 02/21/2020 Elsevier Patient Education  2021 Elsevier Inc.  

## 2020-11-05 NOTE — Progress Notes (Signed)
OB PROBLEM VISIT NOTE  History:     Yvonne Hood is a 25 y.o. [id number] female  She was originally scheduled for a New Ob appointment but endorses recent heavy vaginal bleeding and voices concern that she is no longer pregnant. Patient endorses LMP of 08/24/2020. She experienced new onset vaginal bleeding on 10/26/2020. At that time her bleeding was very heavy with visualization of multiple clots. Her heavy bleeding stopped about two days ago but she continues to see spotting throughout the day. She also reports irregular mild cramping, which stopped today.  Gynecologic History Patient's last menstrual period was 08/24/2020 (exact date). Contraception: none Last Pap: 06/04/2020. 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 Headache(784.0)   . Hearing loss    Bilateral aids    Past Surgical History:  Procedure Laterality Date  . CHOLECYSTECTOMY N/A 01/04/2018   Procedure: LAPAROSCOPIC CHOLECYSTECTOMY;  Surgeon: 01/06/2018, MD;  Location: [facility] ORS;  Service: General;  Laterality: N/A;  . INNER EAR SURGERY     left ear    Current Outpatient Medications on File Prior to Visit  Medication Sig Dispense Refill  . ALPRAZolam (XANAX) 0.5 MG tablet Take 1 tablet (0.5 mg total) by mouth at bedtime as needed for anxiety. (Patient not taking: Reported on 11/05/2020) 30 tablet 1  . norethindrone-ethinyl estradiol (JUNEL FE 1/20) 1-20 MG-MCG tablet Take 1 tablet by mouth daily. (Patient not taking: Reported on 11/05/2020) 30 tablet 12  . ondansetron (ZOFRAN ODT) 4 MG disintegrating tablet Take 1 tablet (4 mg  total) by mouth every 8 (eight) hours as needed for nausea or vomiting. (Patient not taking: Reported on 05/10/2018) 20 tablet 0  . oxyCODONE-acetaminophen (PERCOCET/ROXICET) 5-325 MG tablet Take 1 tablet by mouth every 4 (four) hours as needed for severe pain. (Patient not taking: Reported on 05/10/2018) 15 tablet 0  . Prenatal Vit-Fe Fumarate-FA (PREPLUS) 27-1 MG TABS Take 1 tablet by mouth daily. (Patient not taking: Reported on 11/05/2020) 30 tablet 13   No current facility-administered medications on file prior to visit.    Allergies  Allergen Reactions  . Bee Venom Shortness Of Breath  . Ibuprofen Hives, Swelling and Rash    Social History:  reports that she has never smoked. She has never used smokeless tobacco. She reports that she does not drink alcohol and does not use drugs.  Family History  Problem Relation Age of Onset  . Diabetes Mother   . COPD Mother   . Heart disease Father     The following portions of the patient's history were reviewed and updated as appropriate: allergies, current medications, past family history, past medical history, past social history, past surgical history and problem list.  Review of Systems Pertinent items noted in HPI and remainder of comprehensive ROS otherwise negative.  Physical Exam:  BP 120/85   Pulse 87   Wt 175 lb 12.8 oz (79.7 kg)   LMP 08/24/2020 (Exact Date)   BMI  35.51 kg/m  CONSTITUTIONAL: Well-developed, well-nourished female in no acute distress.  HENT:  Normocephalic, atraumatic, External right and left ear normal.  EYES: Conjunctivae and EOM are normal. Pupils are equal, round, and reactive to light. No scleral icterus.  NECK: Normal range of motion, supple, no masses.  SKIN: Skin is warm and dry. No rash noted. Not diaphoretic. No erythema. No pallor. MUSCULOSKELETAL: Normal range of motion. No tenderness.  No cyanosis, clubbing, or edema. NEUROLOGIC: Alert and oriented to person, place, and time. Normal reflexes,  muscle tone coordination.  PSYCHIATRIC: Normal mood and affect. Normal behavior. Normal judgment and thought content. CARDIOVASCULAR: Normal heart rate noted, regular rhythm RESPIRATORY: Clear to auscultation bilaterally. Effort and breath sounds normal, no problems with respiration noted. BREASTS: Symmetric in size. ABDOMEN: Soft, no distention noted.  No tenderness, rebound or guarding.  PELVIC: Deferred   Assessment and Plan:    1. Bleeding in early pregnancy - Blood type A POS - CBC - Beta hCG quant (ref lab) - POCT urine pregnancy  2. Pregnancy of unknown anatomic location - No staff in office today to perform ultrasound - Patient to return tomorrow for viability scan - Bleeding precautions, indications for evaluation in MAU reviewed with patient   Total visit time: 30 minutes. Greater than 50% of visit spent in counseling and coordination of care Clayton Bibles, MSN, CNM Certified Nurse Midwife, [employer] for [employer], [facility]

## 2020-11-06 ENCOUNTER — Telehealth: Payer: Self-pay

## 2020-11-06 LAB — CBC
Hematocrit: 41.9 % (ref 34.0–46.6)
Hemoglobin: 13.7 g/dL (ref 11.1–15.9)
MCH: 28 pg (ref 26.6–33.0)
MCHC: 32.7 g/dL (ref 31.5–35.7)
MCV: 86 fL (ref 79–97)
Platelets: 580 10*3/uL — ABNORMAL HIGH (ref 150–450)
RBC: 4.89 x10E6/uL (ref 3.77–5.28)
RDW: 13.1 % (ref 11.7–15.4)
WBC: 11.8 10*3/uL — ABNORMAL HIGH (ref 3.4–10.8)

## 2020-11-06 LAB — BETA HCG QUANT (REF LAB): hCG Quant: 64 m[IU]/mL

## 2020-11-06 NOTE — Telephone Encounter (Signed)
-----   Message from Calvert Cantor, [place] sent at 11/06/2020  6:45 AM EDT ----- Quant hCG of 64 at [redacted] weeks gestation and in setting of heavy bleeding. Concern for miscarriage discussed during visit yesterday. Please notify patient. We can continue with plan for ultrasound today or follow with weekly quants. SW

## 2020-11-06 NOTE — Telephone Encounter (Signed)
Pt given quant results. Advised pt she will need another quant tomorrow, pt can go to MAU or [employer] draw station. Pt states she will report to MAU on 11/07/2020 around 9am  Pt verbalizes understanding.

## 2021-03-03 ENCOUNTER — Other Ambulatory Visit (HOSPITAL_COMMUNITY)
Admission: RE | Admit: 2021-03-03 | Discharge: 2021-03-03 | Disposition: A | Discharge status: Home / Self Care | Payer: Medicaid Other | Source: Ambulatory Visit | Attending: Obstetrics and Gynecology | Admitting: Obstetrics and Gynecology

## 2021-03-03 ENCOUNTER — Ambulatory Visit (INDEPENDENT_AMBULATORY_CARE_PROVIDER_SITE_OTHER): Payer: Medicaid Other | Admitting: *Deleted

## 2021-03-03 ENCOUNTER — Other Ambulatory Visit: Payer: Self-pay

## 2021-03-03 VITALS — BP 109/71 | HR 101

## 2021-03-03 DIAGNOSIS — N898 Other specified noninflammatory disorders of vagina: Secondary | ICD-10-CM | POA: Diagnosis not present

## 2021-03-03 DIAGNOSIS — Z3202 Encounter for pregnancy test, result negative: Secondary | ICD-10-CM

## 2021-03-03 LAB — POCT URINE PREGNANCY: Preg Test, Ur: NEGATIVE

## 2021-03-03 NOTE — Progress Notes (Signed)
SUBJECTIVE:  25 y.o. female complains of thick vaginal discharge for few  day(s). Denies abnormal vaginal bleeding or significant pelvic pain or fever. No UTI symptoms. Denies history of known exposure to STD.  No LMP recorded. Pt requesting UPT.  OBJECTIVE:  She appears well, afebrile. Urine dipstick: not done. UPT -negative  ASSESSMENT:  Vaginal Discharge     PLAN:  GC, chlamydia, trichomonas, BVAG, CVAG probe sent to lab. Treatment: To be determined once lab results are received ROV prn if symptoms persist or worsen.

## 2021-03-04 LAB — CERVICOVAGINAL ANCILLARY ONLY
Bacterial Vaginitis (gardnerella): POSITIVE — AB
Candida Glabrata: NEGATIVE
Candida Vaginitis: POSITIVE — AB
Chlamydia: POSITIVE — AB
Comment: NEGATIVE
Comment: NEGATIVE
Comment: NEGATIVE
Comment: NEGATIVE
Comment: NEGATIVE
Comment: NORMAL
Neisseria Gonorrhea: NEGATIVE
Trichomonas: NEGATIVE

## 2021-03-06 ENCOUNTER — Encounter: Payer: Self-pay | Admitting: Obstetrics and Gynecology

## 2021-03-06 DIAGNOSIS — A749 Chlamydial infection, unspecified: Secondary | ICD-10-CM

## 2021-03-06 HISTORY — DX: Chlamydial infection, unspecified: A74.9

## 2021-03-06 MED ORDER — FLUCONAZOLE 150 MG PO TABS: 150.0000 mg | ORAL_TABLET | Freq: Once | ORAL | 0 refills | Status: AC

## 2021-03-06 MED ORDER — METRONIDAZOLE 500 MG PO TABS: 500.0000 mg | ORAL_TABLET | Freq: Two times a day (BID) | ORAL | 0 refills | Status: DC

## 2021-03-06 MED ORDER — AZITHROMYCIN 500 MG PO TABS: 1000.0000 mg | ORAL_TABLET | Freq: Once | ORAL | 0 refills | Status: AC

## 2021-03-06 NOTE — Progress Notes (Signed)
Patient was assessed and managed by nursing staff during this encounter. I have reviewed the chart and agree with the documentation and plan. I have also made any necessary editorial changes.  Karielle Davidow, MD 03/06/2021 10:19 AM   

## 2021-03-06 NOTE — Addendum Note (Signed)
Addended by: [place] Bing on: 03/06/2021 09:02 AM   Modules accepted: Orders

## 2021-03-09 ENCOUNTER — Telehealth: Payer: Self-pay | Admitting: *Deleted

## 2021-03-09 NOTE — Telephone Encounter (Signed)
Pt informed of results and medications have been sent in.Informed to let partner know so that they can be tested and treated as well and to not having any unprotected sex for at least 7-10 days after both have been treated.

## 2021-03-09 NOTE — Telephone Encounter (Signed)
Left message with pt make sure she saw her message from Dr Vergie Living and if not to please call back to go over results.

## 2021-04-15 ENCOUNTER — Other Ambulatory Visit (HOSPITAL_COMMUNITY)
Admission: RE | Admit: 2021-04-15 | Discharge: 2021-04-15 | Disposition: A | Discharge status: Home / Self Care | Payer: Medicaid Other | Source: Ambulatory Visit | Attending: Family Medicine | Admitting: Family Medicine

## 2021-04-15 ENCOUNTER — Ambulatory Visit (INDEPENDENT_AMBULATORY_CARE_PROVIDER_SITE_OTHER): Payer: Medicaid Other

## 2021-04-15 ENCOUNTER — Other Ambulatory Visit: Payer: Self-pay

## 2021-04-15 VITALS — BP 126/86 | HR 106 | Wt 155.0 lb

## 2021-04-15 DIAGNOSIS — Z113 Encounter for screening for infections with a predominantly sexual mode of transmission: Secondary | ICD-10-CM | POA: Diagnosis not present

## 2021-04-15 NOTE — Progress Notes (Signed)
SUBJECTIVE:  25 y.o. female complains of last STD Screening +CT  pt wants to be sure infection is gone. Pt partner has not been treated Denies abnormal vaginal bleeding or significant pelvic pain or fever. No UTI symptoms. No LMP recorded.  OBJECTIVE:  She appears well, afebrile. Urine dipstick: not done.  ASSESSMENT:  Vaginal Discharge : None per pt  Vaginal Odor: None per pt    PLAN:  GC, chlamydia, trichomonas, BVAG, CVAG probe sent to lab. Treatment: To be determined once lab results are received ROV prn if symptoms persist or worsen.

## 2021-04-15 NOTE — Progress Notes (Signed)
Attestation of Attending Supervision of clinical support staff: I agree with the care provided to this patient and was available for any consultation.  I have reviewed the CMA's note and chart, and I agree with the management and plan.  Evangelina Delancey MD MPH, ABFM Attending Physician Faculty Practice- [facility]  

## 2021-04-17 LAB — CERVICOVAGINAL ANCILLARY ONLY
Bacterial Vaginitis (gardnerella): NEGATIVE
Candida Glabrata: NEGATIVE
Candida Vaginitis: POSITIVE — AB
Chlamydia: NEGATIVE
Comment: NEGATIVE
Comment: NEGATIVE
Comment: NEGATIVE
Comment: NEGATIVE
Comment: NEGATIVE
Comment: NORMAL
Neisseria Gonorrhea: NEGATIVE
Trichomonas: NEGATIVE

## 2021-04-23 ENCOUNTER — Encounter: Payer: Self-pay | Admitting: Radiology

## 2021-09-07 ENCOUNTER — Other Ambulatory Visit: Payer: Self-pay

## 2021-09-07 ENCOUNTER — Ambulatory Visit (INDEPENDENT_AMBULATORY_CARE_PROVIDER_SITE_OTHER): Payer: Medicaid Other

## 2021-09-07 ENCOUNTER — Ambulatory Visit (INDEPENDENT_AMBULATORY_CARE_PROVIDER_SITE_OTHER): Payer: Medicaid Other | Admitting: *Deleted

## 2021-09-07 VITALS — BP 116/81 | HR 86 | Wt 165.0 lb

## 2021-09-07 DIAGNOSIS — Z3481 Encounter for supervision of other normal pregnancy, first trimester: Secondary | ICD-10-CM

## 2021-09-07 DIAGNOSIS — O3680X0 Pregnancy with inconclusive fetal viability, not applicable or unspecified: Secondary | ICD-10-CM

## 2021-09-07 DIAGNOSIS — Z3A01 Less than 8 weeks gestation of pregnancy: Secondary | ICD-10-CM

## 2021-09-07 NOTE — Progress Notes (Signed)
New OB Intake ? ?I explained I am completing New OB Intake today. We discussed her EDD of 04/14/22 that is based on LMP of 07/08/21. Pt is G3/P1. I reviewed her allergies, medications, Medical/Surgical/OB history, and appropriate screenings.   ? ?Patient Active Problem List  ? Diagnosis Date Noted  ? Chlamydia 03/06/2021  ? Abnormal Papanicolaou smear of cervix 07/22/2020  ? Anxiety 05/10/2018  ? Cholelithiasis 01/04/2018  ? Headache, rebound 02/11/2015  ? Rubella non-immune status, antepartum 12/19/2014  ? Migraine without aura 04/25/2012  ? ? ?Concerns addressed today ? ? Patient informed that the ultrasound is considered a limited obstetric ultrasound and is not intended to be a complete ultrasound exam.  Patient also informed that the ultrasound is not being completed with the intent of assessing for fetal or placental anomalies or any pelvic abnormalities. Explained that the purpose of today's ultrasound is to assess for dating and fetal heart rate.  Patient acknowledges the purpose of the exam and the limitations of the study.    ? ?Todays scan showed CRL at 6weeks 4days, not consistent with LMP provided. Discussed with patient this could be an early pregnancy, or a possible failed pregnancy.  Will repeat scan in 7-10 days, bleeding precautions given. Pt verbalizes and understands.   ? ?Scheryl Marten, RN ?09/07/2021  9:22 AM  ?

## 2021-09-16 ENCOUNTER — Ambulatory Visit (INDEPENDENT_AMBULATORY_CARE_PROVIDER_SITE_OTHER): Payer: Medicaid Other | Admitting: Obstetrics and Gynecology

## 2021-09-16 ENCOUNTER — Other Ambulatory Visit: Payer: Self-pay | Admitting: Obstetrics and Gynecology

## 2021-09-16 ENCOUNTER — Ambulatory Visit: Payer: Self-pay

## 2021-09-16 VITALS — BP 118/81 | HR 81 | Wt 163.2 lb

## 2021-09-16 DIAGNOSIS — O039 Complete or unspecified spontaneous abortion without complication: Secondary | ICD-10-CM

## 2021-09-16 DIAGNOSIS — O3680X0 Pregnancy with inconclusive fetal viability, not applicable or unspecified: Secondary | ICD-10-CM

## 2021-09-16 NOTE — Progress Notes (Signed)
Here for repeat scan, had some bleeding off and on since last visit. This morning she started having cramping and clots come out.  ? ?Has pt use bathroom in office to empty bladder prior to scan and pt had products of conception come out.  ?

## 2021-09-16 NOTE — Progress Notes (Signed)
Obstetrics and Gynecology Visit ?Return Patient Evaluation ? ?Appointment Date: 09/16/2021 ? ?OB[facility]N Clinic: [facility] for [facility] ? ?Chief Complaint: add on visit for SAB ? ?History of Present Illness:  KAYLOR MAIERS [id number] now [id number] with above CC. Patient here for u/s only visit to follow up viability. She had a 3/2 u/s that showed a CRL of 6/3 with LMP of 8/5 (pt states her LMP is sure with regular qmonth periods)  and with a FHR of 98. ? ?Interval History: While patient was in bathroom emptying her bladder for TVUS, she passed POCs and a sac. Intense cramping and bleeding is now subsiding and minimal.  ? ?Review of Systems: as noted in the History of Present Illness. ? ? ?Patient Active Problem List  ? Diagnosis Date Noted  ? Chlamydia 03/06/2021  ? Abnormal Papanicolaou smear of cervix 07/22/2020  ? Anxiety 05/10/2018  ? Cholelithiasis 01/04/2018  ? Headache, rebound 02/11/2015  ? Rubella non-immune status, antepartum 12/19/2014  ? Migraine without aura 04/25/2012  ? ?Medications:  ?Gabriel Carina had no medications administered during this visit. ?No current outpatient medications on file.  ? ?No current facility-administered medications for this visit.  ? ? ?Allergies: is allergic to bee venom and ibuprofen. ? ?Physical Exam:  ?BP 118/81   Pulse 81   Wt 163 lb 3.2 oz (74 kg)   LMP 07/08/2021   BMI 32.96 kg/m?  Body mass index is 32.96 kg/m?. ?General appearance: Well nourished, well developed female in no acute distress.  ?Abdomen: diffusely non tender to palpation, non distended, and no masses, hernias ?Neuro/Psych:  Normal mood and affect.   ? ?Radiology: see official report but stripe 10-64mm, no AV flow and no e/o previously seen IUP ? ? ?Assessment: pt stable ? ?Plan: ? ?1. Encounter to determine fetal viability of pregnancy, single or unspecified fetus ?- US OB Limited; Future ? ?2. Miscarriage ?Condolences given. Prior SAB a year ago was with different FOB. Pt is A  POS ?- Surgical pathology( [place]/ [facility]) ? ? ?RTC: 2wks for follow up ? ?Cornelia Copa MD ?Attending ?Center for [employer] Midwife) ? ? ?

## 2021-10-06 ENCOUNTER — Encounter: Payer: Medicaid Other | Admitting: Obstetrics & Gynecology

## 2021-11-19 ENCOUNTER — Ambulatory Visit: Payer: Medicaid Other | Admitting: Obstetrics and Gynecology

## 2022-06-14 NOTE — L&D Delivery Note (Signed)
OB/GYN Faculty Practice Delivery Note  Yvonne Hood is a 27 y.o. [phone] s/p SVD at [redacted]w[redacted]d. She was admitted for IOL 2/2 FGR (3%).   ROM: 3h 41m with clear fluid GBS Status:  Negative/-- (12/12 1340) Maximum Maternal Temperature: 98.64F  Labor Progress: Initial SVE: 1/thick/-3. She then progressed to complete.   Delivery Date/Time: 2216 06/02/23 Delivery: Called to room for precipitous delivery. When writer got to bed side, viable female infant on mom's chest in towel. Per nursing report head delivered ROA, no-nuchal cord, shoulder and body delivered in usual fashion, infant with spontaneous cry, placed on mother's abdomen, dried and stimulated. Cord clamped x 2 after 1-minute delay, and cut by FOB. Cord blood drawn. Placenta delivered spontaneously with gentle cord traction. Fundus firm with massage and Pitocin. Labia, perineum, vagina, and cervix inspected without any laceration. Mom and baby doing well.  Mom confirms she still wants PP BTL and understands it is permanent.     Baby Weight: pending  Placenta: 3 vessel, intact. Sent to pathology 2/2 FGR Complications: None Lacerations: None EBL: 51 mL Analgesia: Epidural   Infant:  APGAR (1 MIN): 9  APGAR (5 MINS): 9   Hessie Dibble, MD [facility] Family Medicine Fellow, [facility] for [facility], [facility] 06/02/2023, 10:36 PM

## 2022-10-19 ENCOUNTER — Telehealth: Payer: Self-pay | Admitting: *Deleted

## 2022-10-19 NOTE — Telephone Encounter (Signed)
Attempted to call pt to schedule OB interview, no answer and voicemail was full

## 2022-10-26 ENCOUNTER — Telehealth: Payer: Self-pay | Admitting: *Deleted

## 2022-10-26 NOTE — Telephone Encounter (Signed)
Attempted to call pt, no answer, voicemail full.

## 2022-12-10 ENCOUNTER — Other Ambulatory Visit (INDEPENDENT_AMBULATORY_CARE_PROVIDER_SITE_OTHER): Payer: Medicaid Other

## 2022-12-10 ENCOUNTER — Encounter: Payer: Self-pay | Admitting: Obstetrics and Gynecology

## 2022-12-10 ENCOUNTER — Other Ambulatory Visit (HOSPITAL_COMMUNITY): Admission: RE | Admit: 2022-12-10 | Payer: Medicaid Other | Source: Ambulatory Visit

## 2022-12-10 ENCOUNTER — Ambulatory Visit (INDEPENDENT_AMBULATORY_CARE_PROVIDER_SITE_OTHER): Payer: Medicaid Other | Admitting: Obstetrics and Gynecology

## 2022-12-10 VITALS — BP 113/77 | HR 87 | Wt 160.4 lb

## 2022-12-10 DIAGNOSIS — F419 Anxiety disorder, unspecified: Secondary | ICD-10-CM

## 2022-12-10 DIAGNOSIS — O3680X0 Pregnancy with inconclusive fetal viability, not applicable or unspecified: Secondary | ICD-10-CM | POA: Diagnosis not present

## 2022-12-10 DIAGNOSIS — R87619 Unspecified abnormal cytological findings in specimens from cervix uteri: Secondary | ICD-10-CM | POA: Insufficient documentation

## 2022-12-10 DIAGNOSIS — Z3481 Encounter for supervision of other normal pregnancy, first trimester: Secondary | ICD-10-CM

## 2022-12-10 DIAGNOSIS — Z3A12 12 weeks gestation of pregnancy: Secondary | ICD-10-CM

## 2022-12-10 DIAGNOSIS — O99210 Obesity complicating pregnancy, unspecified trimester: Secondary | ICD-10-CM | POA: Insufficient documentation

## 2022-12-10 DIAGNOSIS — Z6830 Body mass index (BMI) 30.0-30.9, adult: Secondary | ICD-10-CM

## 2022-12-10 DIAGNOSIS — O0990 Supervision of high risk pregnancy, unspecified, unspecified trimester: Secondary | ICD-10-CM | POA: Insufficient documentation

## 2022-12-10 DIAGNOSIS — F32A Depression, unspecified: Secondary | ICD-10-CM

## 2022-12-10 DIAGNOSIS — O0991 Supervision of high risk pregnancy, unspecified, first trimester: Secondary | ICD-10-CM | POA: Diagnosis not present

## 2022-12-10 NOTE — Progress Notes (Signed)
New OB    Pap: 12.22.21  Hx: LGSIL, HPV+  Had colpo

## 2022-12-10 NOTE — Progress Notes (Signed)
New OB Note  12/10/2022   Clinic: Center for [facility]  Chief Complaint: new OB  Transfer of Care Patient: no  History of Present Illness: Yvonne Hood is a 27 y.o. [id number] at 12/1 weeks (EDC 1/9, based on Patient's last menstrual period was 09/16/2022.=12wk u/s today ).  Preg complicated by has Migraine without aura; Rubella non-immune status, antepartum; Anxiety and depression; Abnormal Papanicolaou smear of cervix; Chlamydia; Supervision of high risk pregnancy in first trimester; Obesity in pregnancy; and BMI 30.0-30.9,adult on their problem list.   No SAB s/s, +mild nausea.   ROS: A 12-point review of systems was performed and negative, except as stated in the above HPI.  OBGYN History: As per HPI. OB History  Gravida Para Term Preterm AB Living  4 1 1  0 2 1  SAB IAB Ectopic Multiple Live Births  2 0 0 0 1    # Outcome Date GA Lbr Len/2nd Weight Sex Delivery Anes PTL Lv  4 Current           3 SAB 09/16/21 [redacted]w[redacted]d         2 SAB 10/2020          1 Term 07/07/15 [redacted]w[redacted]d 32:48 / 00:02 7 lb 8.3 oz (3.41 kg) M Vag-Spont EPI  LIV    Any issues with any prior pregnancies: no Prior children are healthy, doing well, and without any problems or issues: yes   Past Medical History: Past Medical History:  Diagnosis Date   Cholelithiasis 01/04/2018   Headache(784.0)    Headache, rebound 02/11/2015   Discontinued caffeine August 2016   Hearing loss    Bilateral aids    Past Surgical History: Past Surgical History:  Procedure Laterality Date   CHOLECYSTECTOMY N/A 01/04/2018   Procedure: LAPAROSCOPIC CHOLECYSTECTOMY;  Surgeon: Carolan Shiver, MD;  Location: [facility] ORS;  Service: General;  Laterality: N/A;   INNER EAR SURGERY     left ear    Family History:  Family History  Problem Relation Age of Onset   Diabetes Mother    COPD Mother    Heart disease Father     Social History:  Social History   Socioeconomic History   Marital status: Single     Spouse name: Not on file   Number of children: Not on file   Years of education: Not on file   Highest education level: Not on file  Occupational History   Occupation: homemaker  Tobacco Use   Smoking status: Never   Smokeless tobacco: Never  Substance and Sexual Activity   Alcohol use: No   Drug use: No   Sexual activity: Yes    Partners: Male    Birth control/protection: None  Other Topics Concern   Not on file  Social History Narrative   Not on file   Social Determinants of Health   Financial Resource Strain: Not on file  Food Insecurity: Not on file  Transportation Needs: Not on file  Physical Activity: Not on file  Stress: Not on file  Social Connections: Not on file  Intimate Partner Violence: Not on file    Allergy: Allergies  Allergen Reactions   Bee Venom Shortness Of Breath   Ibuprofen Hives, Swelling and Rash    Current Outpatient Medications: Prenatal vitamin   Physical Exam:   BP 113/77   Pulse 87   Wt 160 lb 6.4 oz (72.8 kg)   LMP 09/16/2022   BMI 32.40 kg/m  Body mass index is 32.4 kg/m. Contractions:  Not present Vag. Bleeding: None. FHTs: 150s  General appearance: Well nourished, well developed female in no acute distress.  Neck:  Supple, normal appearance, and no thyromegaly  Cardiovascular: S1, S2 normal, no murmur, rub or gallop, regular rate and rhythm Respiratory:  Clear to auscultation bilateral. Normal respiratory effort Abdomen: positive bowel sounds and no masses, hernias; diffusely non tender to palpation, non distended Breasts: no s/s  Neuro/Psych:  Normal mood and affect.  Skin:  Warm and dry.  Lymphatic:  No inguinal lymphadenopathy.   Pelvic exam: is not limited by body habitus EGBUS: within normal limits, Vagina: within normal limits and with no blood in the vault, Cervix: normal appearing cervix without discharge or lesions, closed/long/high, Uterus:  enlarged, c/w 12 week size, and Adnexa:  normal adnexa and no mass,  fullness, tenderness  Laboratory: none  Imaging:  U/s today c/w SLIUP at 12/4 by CRL, +FM, subj normal AF, FHR 150s  Assessment: patient doing well  Plan: 1. Encounter to determine fetal viability of pregnancy, single or unspecified fetus - US OB Limited; Future  2. [redacted] weeks gestation of pregnancy Offer afp next visit. Scheduled anatomy for 19wks - CBC/D/Plt+RPR+Rh+ABO+RubIgG... - Culture, OB Urine - Hemoglobin A1c - TSH Rfx on Abnormal to Free T4 - Cytology - PAP - Comprehensive metabolic panel - Protein / creatinine ratio, urine - [place] MFM OB DETAIL +14 WK; Future  3. Supervision of high risk pregnancy in first trimester - CBC/D/Plt+RPR+Rh+ABO+RubIgG... - Culture, OB Urine - Hemoglobin A1c - TSH Rfx on Abnormal to Free T4 - Cytology - PAP - Comprehensive metabolic panel - Protein / creatinine ratio, urine - [place] MFM OB DETAIL +14 WK; Future  4. Obesity in pregnancy  5. BMI 30.0-30.9,adult  6. Anxiety and depression No issues  7. Abnormal cervical Papanicolaou smear, unspecified abnormal pap finding F/u pap  Problem list reviewed and updated.  Follow up in 4 weeks.  >50% of 35 min visit spent on counseling and coordination of care.     Cornelia Copa MD Attending Center for [facility])

## 2022-12-11 LAB — COMPREHENSIVE METABOLIC PANEL
ALT: 25 IU/L (ref 0–32)
AST: 25 IU/L (ref 0–40)
Albumin: 4.2 g/dL (ref 4.0–5.0)
Alkaline Phosphatase: 100 IU/L (ref 44–121)
BUN/Creatinine Ratio: 15 (ref 9–23)
BUN: 10 mg/dL (ref 6–20)
Bilirubin Total: 0.2 mg/dL (ref 0.0–1.2)
CO2: 22 mmol/L (ref 20–29)
Calcium: 9.7 mg/dL (ref 8.7–10.2)
Chloride: 100 mmol/L (ref 96–106)
Creatinine, Ser: 0.65 mg/dL (ref 0.57–1.00)
Globulin, Total: 2.4 g/dL (ref 1.5–4.5)
Glucose: 80 mg/dL (ref 70–99)
Potassium: 4.3 mmol/L (ref 3.5–5.2)
Sodium: 136 mmol/L (ref 134–144)
Total Protein: 6.6 g/dL (ref 6.0–8.5)
eGFR: 123 mL/min/{1.73_m2} (ref >59)

## 2022-12-11 LAB — CBC/D/PLT+RPR+RH+ABO+RUBIGG...
Antibody Screen: NEGATIVE
Basophils Absolute: 0 10*3/uL (ref 0.0–0.2)
Basos: 0 %
EOS (ABSOLUTE): 0.2 10*3/uL (ref 0.0–0.4)
Eos: 2 %
HCV Ab: NONREACTIVE
HIV Screen 4th Generation wRfx: NONREACTIVE
Hematocrit: 41 % (ref 34.0–46.6)
Hemoglobin: 13.6 g/dL (ref 11.1–15.9)
Hepatitis B Surface Ag: NEGATIVE
Immature Grans (Abs): 0 10*3/uL (ref 0.0–0.1)
Immature Granulocytes: 0 %
Lymphocytes Absolute: 2.7 10*3/uL (ref 0.7–3.1)
Lymphs: 32 %
MCH: 27.4 pg (ref 26.6–33.0)
MCHC: 33.2 g/dL (ref 31.5–35.7)
MCV: 83 fL (ref 79–97)
Monocytes Absolute: 0.5 10*3/uL (ref 0.1–0.9)
Monocytes: 6 %
Neutrophils Absolute: 5 10*3/uL (ref 1.4–7.0)
Neutrophils: 60 %
Platelets: 380 10*3/uL (ref 150–450)
RBC: 4.97 x10E6/uL (ref 3.77–5.28)
RDW: 13.5 % (ref 11.7–15.4)
RPR Ser Ql: NONREACTIVE
Rh Factor: POSITIVE
Rubella Antibodies, IGG: <0.9 index — ABNORMAL LOW (ref >0.99)
WBC: 8.4 10*3/uL (ref 3.4–10.8)

## 2022-12-11 LAB — HEMOGLOBIN A1C
Est. average glucose Bld gHb Est-mCnc: 103 mg/dL
Hgb A1c MFr Bld: 5.2 % (ref 4.8–5.6)

## 2022-12-11 LAB — PROTEIN / CREATININE RATIO, URINE
Creatinine, Urine: 54.4 mg/dL
Protein, Ur: 11.5 mg/dL
Protein/Creat Ratio: 211 mg/g creat — ABNORMAL HIGH (ref 0–200)

## 2022-12-11 LAB — HCV INTERPRETATION

## 2022-12-11 LAB — TSH RFX ON ABNORMAL TO FREE T4: TSH: 1.27 u[IU]/mL (ref 0.450–4.500)

## 2022-12-14 LAB — URINE CULTURE, OB REFLEX

## 2022-12-14 LAB — CULTURE, OB URINE

## 2022-12-22 LAB — CYTOLOGY - PAP
Comment: NEGATIVE
Comment: NEGATIVE
Comment: NEGATIVE
Diagnosis: UNDETERMINED — AB
HPV 16: NEGATIVE
HPV 18 / 45: NEGATIVE
High risk HPV: POSITIVE — AB

## 2023-01-06 ENCOUNTER — Ambulatory Visit (INDEPENDENT_AMBULATORY_CARE_PROVIDER_SITE_OTHER): Payer: Medicaid Other | Admitting: Obstetrics & Gynecology

## 2023-01-06 ENCOUNTER — Encounter: Payer: Self-pay | Admitting: Obstetrics & Gynecology

## 2023-01-06 VITALS — BP 112/76 | HR 90 | Wt 157.0 lb

## 2023-01-06 DIAGNOSIS — O26892 Other specified pregnancy related conditions, second trimester: Secondary | ICD-10-CM

## 2023-01-06 DIAGNOSIS — O99210 Obesity complicating pregnancy, unspecified trimester: Secondary | ICD-10-CM

## 2023-01-06 DIAGNOSIS — R519 Headache, unspecified: Secondary | ICD-10-CM

## 2023-01-06 DIAGNOSIS — O0992 Supervision of high risk pregnancy, unspecified, second trimester: Secondary | ICD-10-CM

## 2023-01-06 DIAGNOSIS — Z3A16 16 weeks gestation of pregnancy: Secondary | ICD-10-CM

## 2023-01-06 MED ORDER — EXCEDRIN TENSION HEADACHE 500-65 MG PO TABS
2.0000 | ORAL_TABLET | Freq: Four times a day (QID) | ORAL | 0 refills | Status: AC | PRN
Start: 2023-01-06 — End: ?

## 2023-01-06 MED ORDER — SUMATRIPTAN SUCCINATE 100 MG PO TABS
100.0000 mg | ORAL_TABLET | Freq: Once | ORAL | 3 refills | Status: AC | PRN
Start: 2023-01-06 — End: ?

## 2023-01-06 NOTE — Progress Notes (Signed)
   PRENATAL VISIT NOTE  Subjective:  Yvonne Hood is a 27 y.o. [phone] at [redacted]w[redacted]d being seen today for ongoing prenatal care.  She is currently monitored for the following issues for this high-risk pregnancy and has Migraine without aura; Rubella non-immune status, antepartum; Anxiety and depression; Abnormal Papanicolaou smear of cervix; Supervision of high-risk pregnancy; Obesity in pregnancy; and BMI 30.0-30.9,adult on their problem list.  Patient reports headache. Has history of migraines. Not alleviated by Tylenol.  Contractions: Irritability. Vag. Bleeding: None.   . Denies leaking of fluid.   The following portions of the patient's history were reviewed and updated as appropriate: allergies, current medications, past family history, past medical history, past social history, past surgical history and problem list.   Objective:   Vitals:   01/06/23 1126  BP: 112/76  Pulse: 90  Weight: 157 lb (71.2 kg)    Fetal Status: Fetal Heart Rate (bpm): 150         General:  Alert, oriented and cooperative. Patient is in no acute distress.  Skin: Skin is warm and dry. No rash noted.   Cardiovascular: Normal heart rate noted  Respiratory: Normal respiratory effort, no problems with respiration noted  Abdomen: Soft, gravid, appropriate for gestational age.  Pain/Pressure: Absent     Pelvic: Cervical exam deferred        Extremities: Normal range of motion.     Mental Status: Normal mood and affect. Normal behavior. Normal judgment and thought content.   Assessment and Plan:  Pregnancy: G4P1021 at [redacted]w[redacted]d 1. Migraine headache in second trimester Recommended acetaminophen+caffeine combination.  Also prescribed Imitrex. Will monitor response. Adequate hydration recommended. - acetaminophen-caffeine (EXCEDRIN TENSION HEADACHE) 500-65 MG TABS per tablet; Take 2 tablets by mouth every 6 (six) hours as needed.  Dispense: 100 tablet; Refill: 0 - SUMAtriptan (IMITREX) 100 MG tablet; Take 1  tablet (100 mg total) by mouth once as needed for up to 1 dose for migraine. May repeat in 2 hours if headache persists or recurs.  Dispense: 36 tablet; Refill: 3  2. Obesity in pregnancy TWG 2 lb.  Unable to take ASA as has severe allergic reaction to NSAIDs (hives).  3. [redacted] weeks gestation of pregnancy 4. Supervision of high risk pregnancy in second trimester Declined NIPS, also declines AFP today. Anatomy scan already scheduled. No other complaints or concerns.  Routine obstetric precautions reviewed.  Please refer to After Visit Summary for other counseling recommendations.   Return in about 4 weeks (around 02/03/2023) for OFFICE OB VISIT (MD or APP).  Future Appointments  Date Time Provider Department Center  01/24/2023 10:00 AM [facility]  02/04/2023 10:35 AM Federico Flake, MD [facility]    Jaynie Collins, MD

## 2023-01-24 ENCOUNTER — Ambulatory Visit: Payer: Medicaid Other

## 2023-02-04 ENCOUNTER — Ambulatory Visit (INDEPENDENT_AMBULATORY_CARE_PROVIDER_SITE_OTHER): Payer: Medicaid Other | Admitting: Family Medicine

## 2023-02-04 VITALS — BP 107/72 | HR 80 | Wt 158.0 lb

## 2023-02-04 DIAGNOSIS — F32A Depression, unspecified: Secondary | ICD-10-CM

## 2023-02-04 DIAGNOSIS — O99210 Obesity complicating pregnancy, unspecified trimester: Secondary | ICD-10-CM

## 2023-02-04 DIAGNOSIS — Z2839 Other underimmunization status: Secondary | ICD-10-CM

## 2023-02-04 DIAGNOSIS — O0992 Supervision of high risk pregnancy, unspecified, second trimester: Secondary | ICD-10-CM

## 2023-02-04 DIAGNOSIS — F419 Anxiety disorder, unspecified: Secondary | ICD-10-CM

## 2023-02-04 DIAGNOSIS — O09899 Supervision of other high risk pregnancies, unspecified trimester: Secondary | ICD-10-CM

## 2023-02-04 NOTE — Progress Notes (Signed)
   PRENATAL VISIT NOTE  Subjective:  Yvonne Hood is a 27 y.o. [phone] at [redacted]w[redacted]d being seen today for ongoing prenatal care.  She is currently monitored for the following issues for this low-risk pregnancy and has Migraine without aura; Rubella non-immune status, antepartum; Anxiety and depression; Abnormal Papanicolaou smear of cervix; Supervision of high-risk pregnancy; Obesity in pregnancy; and BMI 30.0-30.9,adult on their problem list.  Patient reports no complaints.  Contractions: Not present. Vag. Bleeding: None.  Movement: Present. Denies leaking of fluid.   The following portions of the patient's history were reviewed and updated as appropriate: allergies, current medications, past family history, past medical history, past social history, past surgical history and problem list.   Objective:   Vitals:   02/04/23 1051  BP: 107/72  Pulse: 80  Weight: 188 lb (85.3 kg)    Fetal Status: Fetal Heart Rate (bpm): 142   Movement: Present     General:  Alert, oriented and cooperative. Patient is in no acute distress.  Skin: Skin is warm and dry. No rash noted.   Cardiovascular: Normal heart rate noted  Respiratory: Normal respiratory effort, no problems with respiration noted  Abdomen: Soft, gravid, appropriate for gestational age.  Pain/Pressure: Present     Pelvic: Cervical exam deferred        Extremities: Normal range of motion.  Edema: None  Mental Status: Normal mood and affect. Normal behavior. Normal judgment and thought content.   Assessment and Plan:  Pregnancy: G4P1021 at [redacted]w[redacted]d 1. Supervision of high risk pregnancy in second trimester UTD Feeling flutters having some back and abdomen pain, mostly at night and is intermittent, Related to sleeping position Has anatomy scan scheduled next week   2. Obesity in pregnancy TWG=33 lb (15 kg)   3. Rubella non-immune status, antepartum MMR PP  4. Anxiety and depression Up to date  Preterm labor symptoms and general  obstetric precautions including but not limited to vaginal bleeding, contractions, leaking of fluid and fetal movement were reviewed in detail with the patient. Please refer to After Visit Summary for other counseling recommendations.   Return in about 4 weeks (around 03/04/2023) for Routine prenatal care.  Future Appointments  Date Time Provider Department Center  02/09/2023 10:00 AM [facility]  03/03/2023 11:15 AM Erin Bing, MD [facility]    Federico Flake, MD

## 2023-02-04 NOTE — Progress Notes (Deleted)
   PRENATAL VISIT NOTE  Subjective:  Yvonne Hood is a 27 y.o. [phone] at [redacted]w[redacted]d being seen today for ongoing prenatal care.  She is currently monitored for the following issues for this {Blank single:19197::"high-risk","low-risk"} pregnancy and has Migraine without aura; Rubella non-immune status, antepartum; Anxiety and depression; Abnormal Papanicolaou smear of cervix; Supervision of high-risk pregnancy; Obesity in pregnancy; and BMI 30.0-30.9,adult on their problem list.  Patient reports {sx:[id number]}.  Contractions: Not present. Vag. Bleeding: None.  Movement: Present. Denies leaking of fluid.   The following portions of the patient's history were reviewed and updated as appropriate: allergies, current medications, past family history, past medical history, past social history, past surgical history and problem list.   Objective:   Vitals:   02/04/23 1051  BP: 107/72  Pulse: 80  Weight: 188 lb (85.3 kg)    Fetal Status: Fetal Heart Rate (bpm): 142   Movement: Present     General:  Alert, oriented and cooperative. Patient is in no acute distress.  Skin: Skin is warm and dry. No rash noted.   Cardiovascular: Normal heart rate noted  Respiratory: Normal respiratory effort, no problems with respiration noted  Abdomen: Soft, gravid, appropriate for gestational age.  Pain/Pressure: Present     Pelvic: {Blank single:19197::"Cervical exam performed in the presence of a chaperone","Cervical exam deferred"}        Extremities: Normal range of motion.  Edema: None  Mental Status: Normal mood and affect. Normal behavior. Normal judgment and thought content.   Assessment and Plan:  Pregnancy: G4P1021 at [redacted]w[redacted]d 1. Supervision of high risk pregnancy in second trimester ***  2. Obesity in pregnancy ***  3. Rubella non-immune status, antepartum ***  4. Anxiety and depression ***  {Blank single:19197::"Term","Preterm"} labor symptoms and general obstetric precautions including but  not limited to vaginal bleeding, contractions, leaking of fluid and fetal movement were reviewed in detail with the patient. Please refer to After Visit Summary for other counseling recommendations.   No follow-ups on file.  Future Appointments  Date Time Provider Department Center  02/09/2023 10:00 AM [facility]  03/03/2023 11:15 AM [place] Bing, MD [facility]    Federico Flake, MD

## 2023-02-04 NOTE — Progress Notes (Signed)
ROB   Anatomy Scan was R/S to 02/09/23  CC: when laying down and getting back up pt reports she will have pain and her body locks up to the point she is in tears  Will take tylenol.

## 2023-02-09 ENCOUNTER — Other Ambulatory Visit: Payer: Self-pay

## 2023-02-09 ENCOUNTER — Ambulatory Visit: Payer: Medicaid Other | Attending: Obstetrics

## 2023-02-09 DIAGNOSIS — E669 Obesity, unspecified: Secondary | ICD-10-CM

## 2023-02-09 DIAGNOSIS — O365920 Maternal care for other known or suspected poor fetal growth, second trimester, not applicable or unspecified: Secondary | ICD-10-CM | POA: Diagnosis not present

## 2023-02-09 DIAGNOSIS — O99212 Obesity complicating pregnancy, second trimester: Secondary | ICD-10-CM | POA: Insufficient documentation

## 2023-02-09 DIAGNOSIS — Z3A20 20 weeks gestation of pregnancy: Secondary | ICD-10-CM | POA: Insufficient documentation

## 2023-02-09 DIAGNOSIS — O0991 Supervision of high risk pregnancy, unspecified, first trimester: Secondary | ICD-10-CM

## 2023-02-09 DIAGNOSIS — Z3A12 12 weeks gestation of pregnancy: Secondary | ICD-10-CM

## 2023-02-09 DIAGNOSIS — O321XX0 Maternal care for breech presentation, not applicable or unspecified: Secondary | ICD-10-CM | POA: Diagnosis not present

## 2023-02-09 DIAGNOSIS — Z363 Encounter for antenatal screening for malformations: Secondary | ICD-10-CM | POA: Insufficient documentation

## 2023-02-11 ENCOUNTER — Encounter: Payer: Self-pay | Admitting: Obstetrics and Gynecology

## 2023-02-11 DIAGNOSIS — O365990 Maternal care for other known or suspected poor fetal growth, unspecified trimester, not applicable or unspecified: Secondary | ICD-10-CM | POA: Insufficient documentation

## 2023-03-03 ENCOUNTER — Ambulatory Visit (INDEPENDENT_AMBULATORY_CARE_PROVIDER_SITE_OTHER): Payer: Medicaid Other | Admitting: Obstetrics and Gynecology

## 2023-03-03 VITALS — BP 111/73 | HR 87 | Wt 161.0 lb

## 2023-03-03 DIAGNOSIS — O365920 Maternal care for other known or suspected poor fetal growth, second trimester, not applicable or unspecified: Secondary | ICD-10-CM

## 2023-03-03 DIAGNOSIS — R252 Cramp and spasm: Secondary | ICD-10-CM

## 2023-03-03 DIAGNOSIS — Z3A24 24 weeks gestation of pregnancy: Secondary | ICD-10-CM

## 2023-03-03 DIAGNOSIS — O99891 Other specified diseases and conditions complicating pregnancy: Secondary | ICD-10-CM

## 2023-03-03 DIAGNOSIS — O99210 Obesity complicating pregnancy, unspecified trimester: Secondary | ICD-10-CM

## 2023-03-03 NOTE — Progress Notes (Signed)
   PRENATAL VISIT NOTE  Subjective:  Yvonne Hood is a 27 y.o. [phone] at [redacted]w[redacted]d being seen today for ongoing prenatal care.  She is currently monitored for the following issues for this high-risk pregnancy and has Migraine without aura; Rubella non-immune status, antepartum; Anxiety and depression; Abnormal Papanicolaou smear of cervix; Supervision of high-risk pregnancy; Obesity in pregnancy; BMI 30.0-30.9,adult; and Poor fetal growth affecting management of mother on their problem list.  Patient reports  leg cramps .  Contractions: Not present. Vag. Bleeding: None.  Movement: Present. Denies leaking of fluid.   The following portions of the patient's history were reviewed and updated as appropriate: allergies, current medications, past family history, past medical history, past social history, past surgical history and problem list.   Objective:   Vitals:   03/03/23 1115  BP: 111/73  Pulse: 87  Weight: 161 lb (73 kg)    Fetal Status:     Movement: Present     General:  Alert, oriented and cooperative. Patient is in no acute distress.  Skin: Skin is warm and dry. No rash noted.   Cardiovascular: Normal heart rate noted  Respiratory: Normal respiratory effort, no problems with respiration noted  Abdomen: Soft, gravid, appropriate for gestational age.  Pain/Pressure: Present     Pelvic: Cervical exam deferred        Extremities: Normal range of motion.     Mental Status: Normal mood and affect. Normal behavior. Normal judgment and thought content.   Assessment and Plan:  Pregnancy: G4P1021 at [redacted]w[redacted]d 1. [redacted] weeks gestation of pregnancy 28wk labs next visit  2. Poor fetal growth affecting management of mother in second trimester, single or unspecified fetus Follow up mfm scan  3. Obesity in pregnancy 6 lbs total weight gain. Goals d/w her  4. Leg cramps in pregnancy OTC Mg, increase K in diet, compression stockings  Preterm labor symptoms and general obstetric  precautions including but not limited to vaginal bleeding, contractions, leaking of fluid and fetal movement were reviewed in detail with the patient. Please refer to After Visit Summary for other counseling recommendations.   No follow-ups on file.  Future Appointments  Date Time Provider Department Center  03/14/2023 10:00 AM [facility]  04/01/2023  8:30 AM [facility]  04/01/2023  8:55 AM Federico Flake, MD [facility]  04/14/2023 10:55 AM [place] Bing, MD [facility]  04/28/2023 11:15 AM Anyanwu, Jethro Bastos, MD [facility]  05/11/2023 11:15 AM [place] Bing, MD [facility]    Martin Bing, MD

## 2023-03-03 NOTE — Progress Notes (Signed)
Getting bad leg cramps at night

## 2023-03-09 ENCOUNTER — Other Ambulatory Visit: Payer: Self-pay | Admitting: Obstetrics

## 2023-03-09 DIAGNOSIS — O365920 Maternal care for other known or suspected poor fetal growth, second trimester, not applicable or unspecified: Secondary | ICD-10-CM

## 2023-03-14 ENCOUNTER — Ambulatory Visit: Payer: Medicaid Other | Attending: Maternal & Fetal Medicine

## 2023-03-14 ENCOUNTER — Other Ambulatory Visit: Payer: Self-pay

## 2023-03-14 ENCOUNTER — Other Ambulatory Visit: Payer: Self-pay | Admitting: Maternal & Fetal Medicine

## 2023-03-14 ENCOUNTER — Other Ambulatory Visit: Payer: Self-pay | Admitting: Obstetrics

## 2023-03-14 DIAGNOSIS — Z3A25 25 weeks gestation of pregnancy: Secondary | ICD-10-CM | POA: Insufficient documentation

## 2023-03-14 DIAGNOSIS — O365920 Maternal care for other known or suspected poor fetal growth, second trimester, not applicable or unspecified: Secondary | ICD-10-CM | POA: Diagnosis present

## 2023-03-14 DIAGNOSIS — Z362 Encounter for other antenatal screening follow-up: Secondary | ICD-10-CM | POA: Insufficient documentation

## 2023-03-14 DIAGNOSIS — E669 Obesity, unspecified: Secondary | ICD-10-CM | POA: Diagnosis not present

## 2023-03-14 DIAGNOSIS — O99212 Obesity complicating pregnancy, second trimester: Secondary | ICD-10-CM | POA: Diagnosis not present

## 2023-03-23 ENCOUNTER — Other Ambulatory Visit: Payer: Medicaid Other

## 2023-03-28 ENCOUNTER — Ambulatory Visit: Payer: Medicaid Other | Attending: Obstetrics

## 2023-03-28 ENCOUNTER — Other Ambulatory Visit: Payer: Self-pay

## 2023-03-28 ENCOUNTER — Ambulatory Visit: Payer: Medicaid Other | Admitting: Obstetrics

## 2023-03-28 VITALS — BP 101/70 | HR 89 | Temp 97.7°F | Resp 16 | Ht 59.0 in | Wt 162.0 lb

## 2023-03-28 DIAGNOSIS — Z3A27 27 weeks gestation of pregnancy: Secondary | ICD-10-CM | POA: Insufficient documentation

## 2023-03-28 DIAGNOSIS — O365920 Maternal care for other known or suspected poor fetal growth, second trimester, not applicable or unspecified: Secondary | ICD-10-CM

## 2023-03-28 DIAGNOSIS — O99212 Obesity complicating pregnancy, second trimester: Secondary | ICD-10-CM | POA: Insufficient documentation

## 2023-03-28 DIAGNOSIS — E669 Obesity, unspecified: Secondary | ICD-10-CM | POA: Diagnosis not present

## 2023-03-28 DIAGNOSIS — O321XX0 Maternal care for breech presentation, not applicable or unspecified: Secondary | ICD-10-CM | POA: Insufficient documentation

## 2023-03-28 DIAGNOSIS — G43009 Migraine without aura, not intractable, without status migrainosus: Secondary | ICD-10-CM

## 2023-03-28 DIAGNOSIS — O09292 Supervision of pregnancy with other poor reproductive or obstetric history, second trimester: Secondary | ICD-10-CM

## 2023-03-28 DIAGNOSIS — Z6830 Body mass index (BMI) 30.0-30.9, adult: Secondary | ICD-10-CM

## 2023-03-28 NOTE — Procedures (Signed)
Yvonne Hood 07-28-1995 [redacted]w[redacted]d  Fetus A Non-Stress Test Interpretation for 03/28/23  Indication:  obesity, SAB x2, FGR  Fetal Heart Rate A Mode: External Baseline Rate (A): 145 bpm Variability: Moderate Accelerations: 15 x 15 Decelerations: Variable  Uterine Activity Mode: Palpation, Toco Contraction Frequency (min): none noted Resting Tone Palpated: Relaxed Resting Time: Adequate  Interpretation (Fetal Testing) Nonstress Test Interpretation: Reactive Overall Impression: Reassuring for gestational age Comments: Dr. Parke Poisson

## 2023-04-01 ENCOUNTER — Other Ambulatory Visit: Payer: Medicaid Other

## 2023-04-01 ENCOUNTER — Ambulatory Visit (INDEPENDENT_AMBULATORY_CARE_PROVIDER_SITE_OTHER): Payer: Medicaid Other | Admitting: Family Medicine

## 2023-04-01 VITALS — BP 116/77 | HR 105 | Wt 163.4 lb

## 2023-04-01 DIAGNOSIS — O365930 Maternal care for other known or suspected poor fetal growth, third trimester, not applicable or unspecified: Secondary | ICD-10-CM

## 2023-04-01 DIAGNOSIS — O0993 Supervision of high risk pregnancy, unspecified, third trimester: Secondary | ICD-10-CM

## 2023-04-01 DIAGNOSIS — O0990 Supervision of high risk pregnancy, unspecified, unspecified trimester: Secondary | ICD-10-CM

## 2023-04-01 DIAGNOSIS — O99210 Obesity complicating pregnancy, unspecified trimester: Secondary | ICD-10-CM

## 2023-04-01 NOTE — Progress Notes (Signed)
Declines tdap/flu  Denies any concerns

## 2023-04-01 NOTE — Progress Notes (Signed)
   PRENATAL VISIT NOTE  Subjective:  Yvonne Hood is a 27 y.o. [phone] at [redacted]w[redacted]d being seen today for ongoing prenatal care.  She is currently monitored for the following issues for this high-risk pregnancy and has Migraine without aura; Rubella non-immune status, antepartum; Anxiety and depression; Abnormal Papanicolaou smear of cervix; Supervision of high-risk pregnancy; Obesity in pregnancy; BMI 30.0-30.9,adult; and Poor fetal growth affecting management of mother on their problem list.  Patient reports no complaints.  Contractions: Not present. Vag. Bleeding: None.  Movement: Present. Denies leaking of fluid.   The following portions of the patient's history were reviewed and updated as appropriate: allergies, current medications, past family history, past medical history, past social history, past surgical history and problem list.   Objective:   Vitals:   04/01/23 0909  BP: 116/77  Pulse: (!) 105  Weight: 163 lb 6.4 oz (74.1 kg)    Fetal Status: Fetal Heart Rate (bpm): 138 Fundal Height: 28 cm Movement: Present     General:  Alert, oriented and cooperative. Patient is in no acute distress.  Skin: Skin is warm and dry. No rash noted.   Cardiovascular: Normal heart rate noted  Respiratory: Normal respiratory effort, no problems with respiration noted  Abdomen: Soft, gravid, appropriate for gestational age.  Pain/Pressure: Absent     Pelvic: Cervical exam deferred        Extremities: Normal range of motion.  Edema: None  Mental Status: Normal mood and affect. Normal behavior. Normal judgment and thought content.   Assessment and Plan:  Pregnancy: G4P1021 at [redacted]w[redacted]d 1. Supervision of high risk pregnancy in third trimester Up to date Completing GTT today Vigorous movement today No concerns today Signed BTS consent today  2. Obesity in pregnancy TWG=8 lb 6.4 oz (3.81 kg)   3. Poor fetal growth affecting management of mother in third trimester, single or unspecified  fetus Has MFM follow up Last growth in Sept was 4.9th% Cord dopplers have been WNL Has follow up for growth next week  Preterm labor symptoms and general obstetric precautions including but not limited to vaginal bleeding, contractions, leaking of fluid and fetal movement were reviewed in detail with the patient. Please refer to After Visit Summary for other counseling recommendations.   Return in about 2 weeks (around 04/15/2023) for Routine prenatal care.  Future Appointments  Date Time Provider Department Center  04/04/2023  3:00 PM [facility] None  04/04/2023  3:30 PM [facility]  04/13/2023  2:00 PM [facility]ST [facility] None  04/13/2023  3:00 PM [facility]  04/14/2023 10:55 AM [place] Bing, MD [facility]  04/28/2023 11:15 AM Anyanwu, Jethro Bastos, MD [facility]  05/11/2023 11:15 AM Reva Bores, MD [facility]    Federico Flake, MD

## 2023-04-02 LAB — CBC
Hematocrit: 36.9 % (ref 34.0–46.6)
Hemoglobin: 11.7 g/dL (ref 11.1–15.9)
MCH: 28.6 pg (ref 26.6–33.0)
MCHC: 31.7 g/dL (ref 31.5–35.7)
MCV: 90 fL (ref 79–97)
Platelets: 314 10*3/uL (ref 150–450)
RBC: 4.09 x10E6/uL (ref 3.77–5.28)
RDW: 12.8 % (ref 11.7–15.4)
WBC: 10.8 10*3/uL (ref 3.4–10.8)

## 2023-04-02 LAB — GLUCOSE TOLERANCE, 2 HOURS W/ 1HR
Glucose, 1 hour: 122 mg/dL (ref 70–179)
Glucose, 2 hour: 106 mg/dL (ref 70–152)
Glucose, Fasting: 80 mg/dL (ref 70–91)

## 2023-04-02 LAB — RPR: RPR Ser Ql: NONREACTIVE

## 2023-04-02 LAB — HIV ANTIBODY (ROUTINE TESTING W REFLEX): HIV Screen 4th Generation wRfx: NONREACTIVE

## 2023-04-04 ENCOUNTER — Other Ambulatory Visit: Payer: Self-pay

## 2023-04-04 ENCOUNTER — Ambulatory Visit: Payer: Medicaid Other | Attending: Maternal & Fetal Medicine

## 2023-04-04 ENCOUNTER — Ambulatory Visit: Payer: Medicaid Other | Admitting: Maternal & Fetal Medicine

## 2023-04-04 VITALS — BP 114/73 | HR 107 | Temp 98.0°F | Resp 18 | Ht 59.0 in | Wt 163.5 lb

## 2023-04-04 DIAGNOSIS — O365920 Maternal care for other known or suspected poor fetal growth, second trimester, not applicable or unspecified: Secondary | ICD-10-CM

## 2023-04-04 DIAGNOSIS — O365930 Maternal care for other known or suspected poor fetal growth, third trimester, not applicable or unspecified: Secondary | ICD-10-CM | POA: Insufficient documentation

## 2023-04-04 DIAGNOSIS — O99213 Obesity complicating pregnancy, third trimester: Secondary | ICD-10-CM | POA: Insufficient documentation

## 2023-04-04 DIAGNOSIS — Z3A28 28 weeks gestation of pregnancy: Secondary | ICD-10-CM | POA: Insufficient documentation

## 2023-04-04 DIAGNOSIS — E669 Obesity, unspecified: Secondary | ICD-10-CM | POA: Diagnosis not present

## 2023-04-04 DIAGNOSIS — Z362 Encounter for other antenatal screening follow-up: Secondary | ICD-10-CM | POA: Diagnosis not present

## 2023-04-04 NOTE — Procedures (Signed)
Yvonne Hood [birth year] [redacted]w[redacted]d  Fetus A Non-Stress Test Interpretation for 04/04/23  Indication: IUGR  Fetal Heart Rate A Mode: External Baseline Rate (A): 125 bpm Variability: Moderate Accelerations: 15 x 15 Decelerations: None Multiple birth?: No  Uterine Activity Mode: Toco Contraction Frequency (min): none noted Resting Tone Palpated: Relaxed Resting Time: Adequate  Interpretation (Fetal Testing) Nonstress Test Interpretation: Reactive (Dr. Darra Lis Read NST)

## 2023-04-13 ENCOUNTER — Other Ambulatory Visit: Payer: Self-pay

## 2023-04-13 ENCOUNTER — Ambulatory Visit (HOSPITAL_BASED_OUTPATIENT_CLINIC_OR_DEPARTMENT_OTHER): Payer: Medicaid Other | Admitting: Maternal & Fetal Medicine

## 2023-04-13 ENCOUNTER — Ambulatory Visit: Payer: Medicaid Other | Attending: Maternal & Fetal Medicine

## 2023-04-13 VITALS — BP 128/67 | HR 100 | Temp 97.8°F | Resp 18 | Ht 59.0 in | Wt 163.6 lb

## 2023-04-13 DIAGNOSIS — Z3A29 29 weeks gestation of pregnancy: Secondary | ICD-10-CM | POA: Diagnosis not present

## 2023-04-13 DIAGNOSIS — O365930 Maternal care for other known or suspected poor fetal growth, third trimester, not applicable or unspecified: Secondary | ICD-10-CM | POA: Diagnosis not present

## 2023-04-13 DIAGNOSIS — E669 Obesity, unspecified: Secondary | ICD-10-CM | POA: Diagnosis not present

## 2023-04-13 DIAGNOSIS — F419 Anxiety disorder, unspecified: Secondary | ICD-10-CM

## 2023-04-13 DIAGNOSIS — O365990 Maternal care for other known or suspected poor fetal growth, unspecified trimester, not applicable or unspecified: Secondary | ICD-10-CM | POA: Insufficient documentation

## 2023-04-13 DIAGNOSIS — G43009 Migraine without aura, not intractable, without status migrainosus: Secondary | ICD-10-CM

## 2023-04-13 DIAGNOSIS — O99213 Obesity complicating pregnancy, third trimester: Secondary | ICD-10-CM

## 2023-04-13 DIAGNOSIS — O99210 Obesity complicating pregnancy, unspecified trimester: Secondary | ICD-10-CM

## 2023-04-13 DIAGNOSIS — O365920 Maternal care for other known or suspected poor fetal growth, second trimester, not applicable or unspecified: Secondary | ICD-10-CM

## 2023-04-13 DIAGNOSIS — R87619 Unspecified abnormal cytological findings in specimens from cervix uteri: Secondary | ICD-10-CM

## 2023-04-13 NOTE — Procedures (Signed)
Yvonne Hood December 18, 1995 [redacted]w[redacted]d  Fetus A Non-Stress Test Interpretation for 04/13/23  Indication: IUGR  Fetal Heart Rate A Mode: External Baseline Rate (A): 135 bpm Variability: Moderate Accelerations: 15 x 15 Decelerations: Variable Multiple birth?: No  Uterine Activity Mode: Toco, Palpation Contraction Frequency (min): none Resting Tone Palpated: Relaxed Resting Time: Adequate  Interpretation (Fetal Testing) Nonstress Test Interpretation: Reactive Overall Impression: Reassuring for gestational age Comments: Dr. Darra Lis

## 2023-04-14 ENCOUNTER — Other Ambulatory Visit: Payer: Self-pay | Admitting: Maternal & Fetal Medicine

## 2023-04-14 ENCOUNTER — Ambulatory Visit: Payer: Medicaid Other | Admitting: Obstetrics and Gynecology

## 2023-04-14 VITALS — BP 113/76 | HR 105 | Wt 163.0 lb

## 2023-04-14 DIAGNOSIS — O0993 Supervision of high risk pregnancy, unspecified, third trimester: Secondary | ICD-10-CM

## 2023-04-14 DIAGNOSIS — R87619 Unspecified abnormal cytological findings in specimens from cervix uteri: Secondary | ICD-10-CM

## 2023-04-14 DIAGNOSIS — O365920 Maternal care for other known or suspected poor fetal growth, second trimester, not applicable or unspecified: Secondary | ICD-10-CM

## 2023-04-14 DIAGNOSIS — O365930 Maternal care for other known or suspected poor fetal growth, third trimester, not applicable or unspecified: Secondary | ICD-10-CM

## 2023-04-14 DIAGNOSIS — O99210 Obesity complicating pregnancy, unspecified trimester: Secondary | ICD-10-CM

## 2023-04-14 DIAGNOSIS — Z3A30 30 weeks gestation of pregnancy: Secondary | ICD-10-CM

## 2023-04-14 MED ORDER — FAMOTIDINE 20 MG PO TABS
20.0000 mg | ORAL_TABLET | Freq: Two times a day (BID) | ORAL | 3 refills | Status: DC
Start: 2023-04-14 — End: 2023-08-24

## 2023-04-14 NOTE — Progress Notes (Signed)
   PRENATAL VISIT NOTE  Subjective:  Yvonne Hood is a 27 y.o. [phone] at [redacted]w[redacted]d being seen today for ongoing prenatal care.  She is currently monitored for the following issues for this high-risk pregnancy and has Migraine without aura; Rubella non-immune status, antepartum; Anxiety and depression; Abnormal Papanicolaou smear of cervix; Supervision of high-risk pregnancy; Obesity in pregnancy; BMI 30.0-30.9,adult; and Poor fetal growth affecting management of mother on their problem list.  Patient reports no complaints.  Contractions: Not present. Vag. Bleeding: None.  Movement: Present. Denies leaking of fluid.   The following portions of the patient's history were reviewed and updated as appropriate: allergies, current medications, past family history, past medical history, past social history, past surgical history and problem list.   Objective:   Vitals:   04/14/23 1119  BP: 113/76  Pulse: (!) 105  Weight: 163 lb (73.9 kg)    Fetal Status: Fetal Heart Rate (bpm): 141   Movement: Present     General:  Alert, oriented and cooperative. Patient is in no acute distress.  Skin: Skin is warm and dry. No rash noted.   Cardiovascular: Normal heart rate noted  Respiratory: Normal respiratory effort, no problems with respiration noted  Abdomen: Soft, gravid, appropriate for gestational age.  Pain/Pressure: Present     Pelvic: Cervical exam deferred        Extremities: Normal range of motion.     Mental Status: Normal mood and affect. Normal behavior. Normal judgment and thought content.   Assessment and Plan:  Pregnancy: G4P1021 at [redacted]w[redacted]d 1. [redacted] weeks gestation of pregnancy Normal 28wk labs  2. Supervision of high risk pregnancy in third trimester  3. Poor fetal growth affecting management of mother in third trimester, single or unspecified fetus Normal UA dopplers and AFI today (afi 9.6, ua wnl). Continue weekly testing 10/21: 3.5%, 1005gm, ac 5%, afi 8  4. Obesity in  pregnancy Weight stable  5. Abnormal cervical Papanicolaou smear, unspecified abnormal pap finding Needs pp colpo  Preterm labor symptoms and general obstetric precautions including but not limited to vaginal bleeding, contractions, leaking of fluid and fetal movement were reviewed in detail with the patient. Please refer to After Visit Summary for other counseling recommendations.   No follow-ups on file.  Future Appointments  Date Time Provider Department Center  04/20/2023  1:30 PM [facility]  04/27/2023  3:00 PM [facility]  04/28/2023 11:15 AM Anyanwu, Jethro Bastos, MD [facility]  05/04/2023 10:00 AM [facility]  05/11/2023 11:15 AM Reva Bores, MD [facility]    [place] Bing, MD

## 2023-04-15 NOTE — Progress Notes (Signed)
Nst reactive

## 2023-04-20 ENCOUNTER — Other Ambulatory Visit: Payer: Medicaid Other

## 2023-04-20 ENCOUNTER — Other Ambulatory Visit: Payer: Self-pay

## 2023-04-20 ENCOUNTER — Ambulatory Visit: Payer: Medicaid Other | Admitting: Obstetrics and Gynecology

## 2023-04-20 ENCOUNTER — Ambulatory Visit: Payer: Medicaid Other | Attending: Obstetrics and Gynecology

## 2023-04-20 VITALS — BP 114/66 | HR 95 | Temp 97.5°F | Resp 18 | Ht 59.0 in | Wt 164.0 lb

## 2023-04-20 DIAGNOSIS — O365930 Maternal care for other known or suspected poor fetal growth, third trimester, not applicable or unspecified: Secondary | ICD-10-CM

## 2023-04-20 DIAGNOSIS — Z3A30 30 weeks gestation of pregnancy: Secondary | ICD-10-CM | POA: Diagnosis not present

## 2023-04-20 DIAGNOSIS — O99213 Obesity complicating pregnancy, third trimester: Secondary | ICD-10-CM | POA: Diagnosis not present

## 2023-04-20 DIAGNOSIS — E669 Obesity, unspecified: Secondary | ICD-10-CM

## 2023-04-20 DIAGNOSIS — O321XX0 Maternal care for breech presentation, not applicable or unspecified: Secondary | ICD-10-CM | POA: Insufficient documentation

## 2023-04-20 DIAGNOSIS — O365920 Maternal care for other known or suspected poor fetal growth, second trimester, not applicable or unspecified: Secondary | ICD-10-CM

## 2023-04-20 NOTE — Procedures (Signed)
Yvonne Hood 1996-03-30 [redacted]w[redacted]d  Fetus A Non-Stress Test Interpretation for 04/20/23  Indication: IUGR  Fetal Heart Rate A Mode: External Baseline Rate (A): 125 bpm Variability: Moderate Accelerations: 15 x 15 Decelerations: None Multiple birth?: No     Interpretation (Fetal Testing) Nonstress Test Interpretation: Reactive Overall Impression: Reassuring for gestational age Comments: Dr. Judeth Cornfield

## 2023-04-27 ENCOUNTER — Other Ambulatory Visit: Payer: Self-pay

## 2023-04-27 ENCOUNTER — Ambulatory Visit: Payer: Medicaid Other | Attending: Maternal & Fetal Medicine

## 2023-04-27 ENCOUNTER — Other Ambulatory Visit: Payer: Medicaid Other

## 2023-04-27 DIAGNOSIS — O365930 Maternal care for other known or suspected poor fetal growth, third trimester, not applicable or unspecified: Secondary | ICD-10-CM | POA: Diagnosis not present

## 2023-04-27 DIAGNOSIS — Z3A31 31 weeks gestation of pregnancy: Secondary | ICD-10-CM | POA: Diagnosis not present

## 2023-04-27 DIAGNOSIS — Z362 Encounter for other antenatal screening follow-up: Secondary | ICD-10-CM | POA: Diagnosis not present

## 2023-04-27 DIAGNOSIS — E669 Obesity, unspecified: Secondary | ICD-10-CM | POA: Diagnosis not present

## 2023-04-27 DIAGNOSIS — O365920 Maternal care for other known or suspected poor fetal growth, second trimester, not applicable or unspecified: Secondary | ICD-10-CM

## 2023-04-27 DIAGNOSIS — O99213 Obesity complicating pregnancy, third trimester: Secondary | ICD-10-CM | POA: Insufficient documentation

## 2023-04-28 ENCOUNTER — Encounter: Payer: Self-pay | Admitting: Obstetrics & Gynecology

## 2023-04-28 ENCOUNTER — Ambulatory Visit (INDEPENDENT_AMBULATORY_CARE_PROVIDER_SITE_OTHER): Payer: Medicaid Other | Admitting: Obstetrics & Gynecology

## 2023-04-28 VITALS — BP 111/74 | HR 96 | Wt 164.0 lb

## 2023-04-28 DIAGNOSIS — O365990 Maternal care for other known or suspected poor fetal growth, unspecified trimester, not applicable or unspecified: Secondary | ICD-10-CM

## 2023-04-28 DIAGNOSIS — O0993 Supervision of high risk pregnancy, unspecified, third trimester: Secondary | ICD-10-CM

## 2023-04-28 DIAGNOSIS — Z3A32 32 weeks gestation of pregnancy: Secondary | ICD-10-CM

## 2023-04-28 NOTE — Patient Instructions (Signed)

## 2023-04-28 NOTE — Progress Notes (Signed)
PRENATAL VISIT NOTE  Subjective:  Yvonne Hood is a 27 y.o. [phone] at [redacted]w[redacted]d being seen today for ongoing prenatal care.  She is currently monitored for the following issues for this high-risk pregnancy and has Migraine without aura; Rubella non-immune status, antepartum; Anxiety and depression; Abnormal Papanicolaou smear of cervix; Supervision of high-risk pregnancy; Obesity in pregnancy; BMI 30.0-30.9,adult; and Poor fetal growth affecting management of mother on their problem list.  Patient reports no complaints.  Contractions: Not present. Vag. Bleeding: None.  Movement: Present. Denies leaking of fluid.   The following portions of the patient's history were reviewed and updated as appropriate: allergies, current medications, past family history, past medical history, past social history, past surgical history and problem list.   Objective:   Vitals:   04/28/23 1105  BP: 111/74  Pulse: 96  Weight: 164 lb (74.4 kg)    Fetal Status: Fetal Heart Rate (bpm): 146   Movement: Present     General:  Alert, oriented and cooperative. Patient is in no acute distress.  Skin: Skin is warm and dry. No rash noted.   Cardiovascular: Normal heart rate noted  Respiratory: Normal respiratory effort, no problems with respiration noted  Abdomen: Soft, gravid, appropriate for gestational age.  Pain/Pressure: Present     Pelvic: Cervical exam deferred        Extremities: Normal range of motion.  Edema: None  Mental Status: Normal mood and affect. Normal behavior. Normal judgment and thought content.   Imaging: [place] MFM UA CORD DOPPLER  Result Date: 04/27/2023 ----------------------------------------------------------------------  OBSTETRICS REPORT                       (Signed Final 04/27/2023 05:07 pm) ---------------------------------------------------------------------- Patient Info  ID #:       [id number]                          D.O.B.:  1996/03/19 (27 yrs)  Name:       Yvonne Hood              Visit Date: 04/27/2023 04:34 pm ---------------------------------------------------------------------- Performed By  Attending:        Ma Rings MD         Ref. Address:     [address]                                                             [place], [place]                                                             [address]  Performed By:     Eden Lathe BS      [facility]                    RD[facility] RVT  F[facility] at                                                             [facility]  Referred By:      [place] Bing MD ---------------------------------------------------------------------- Orders  #  Description                           Code        Ordered By  1  [place] MFM UA CORD DOPPLER                76820.02    BURK SCHAIBLE  2  [place] MFM FETAL BPP WO NON               76819.01    [facility]     STRESS ----------------------------------------------------------------------  #  Order #                     Accession #                Episode #  1  [id number]                   [id number]                 [id number]  2  [id number]                   [id number]                 [id number] ---------------------------------------------------------------------- Indications  Maternal care for known or suspected poor      O36.5930  fetal growth, third trimester, not applicable or  unspecified IUGR  Obesity complicating pregnancy, third          O99.213  trimester  Encounter for other antenatal screening        Z36.2  follow-up  Declined testing  [redacted] weeks gestation of pregnancy                Z3A.31 ---------------------------------------------------------------------- Fetal Evaluation  Num Of Fetuses:         1  Fetal Heart Rate(bpm):  146  Cardiac Activity:       Observed  Presentation:           Cephalic  Placenta:               Posterior  P. Cord Insertion:      Previously seen  Amniotic Fluid  AFI FV:      Within normal limits   AFI Sum(cm)     %Tile       Largest Pocket(cm)  12.31           34          4.43  RUQ(cm)       RLQ(cm)       LUQ(cm)        LLQ(cm)  2.2           2.58          3.1            4.43 ---------------------------------------------------------------------- Biophysical Evaluation  Amniotic F.V:   Within normal limits       F. Tone:        Observed  F. Movement:    Observed                   Score:          8/8  F. Breathing:   Observed ---------------------------------------------------------------------- OB History  Gravidity:    4         Term:   1        Prem:   0        SAB:   2  TOP:          0       Ectopic:  0        Living: 1 ---------------------------------------------------------------------- Gestational Age  LMP:           31w 6d        Date:  09/16/22                   EDD:   06/23/23  Best:          Bobbye Riggs 6d     Det. By:  LMP  (09/16/22)          EDD:   06/23/23 ---------------------------------------------------------------------- Anatomy  Ventricles:            Appears normal         Kidneys:                Appear normal  Stomach:               Appears normal, left   Bladder:                Appears normal                         sided ---------------------------------------------------------------------- Doppler - Fetal Vessels  Umbilical Artery   S/D     %tile      RI    %tile      PI    %tile     PSV    ADFV    RDFV                                                     (cm/s)   3.53       88    0.72       90    1.19       92    49.53      No      No ---------------------------------------------------------------------- Cervix Uterus Adnexa  Cervix  Not visualized (advanced GA >24wks)  Uterus  No abnormality visualized.  Right Ovary  Not visualized.  Left Ovary  Not visualized.  Cul De Sac  No free fluid seen.  Adnexa  No abnormality visualized ---------------------------------------------------------------------- Comments  This patient was seen due to an IUGR fetus.  She denies any  problems since her last exam.   She reports feeling vigorous  fetal movements throughout the day.  A BPP performed today was 8/8.  The total AFI was 12.31 cm (within normal limits).  Doppler studies of the umbilical arteries performed due to  fetal growth restriction showed a normal S/D ratio of 3.53.  There were  no signs of absent or reversed end-diastolic flow  noted today.  She will return in 1 week for a BPP, growth scan, and  umbilical artery Doppler study. ----------------------------------------------------------------------                   Ma Rings, MD Electronically Signed Final Report   04/27/2023 05:07 pm ----------------------------------------------------------------------   [place] MFM FETAL BPP WO NON STRESS  Result Date: 04/27/2023 ----------------------------------------------------------------------  OBSTETRICS REPORT                       (Signed Final 04/27/2023 05:07 pm) ---------------------------------------------------------------------- Patient Info  ID #:       [id number]                          D.O.B.:  1996/04/06 (27 yrs)  Name:       Yvonne Hood             Visit Date: 04/27/2023 04:34 pm ---------------------------------------------------------------------- Performed By  Attending:        Ma Rings MD         Ref. Address:     [address]                                                             [place], [place]                                                             [address]  Performed By:     Eden Lathe BS      [facility]         [facility]                    RDMS RVT                                 Fetal Care at                                                             [facility]  Referred By:      [place] Bing MD ---------------------------------------------------------------------- Orders  #  Description                           Code        Ordered By  1  [place] MFM UA CORD DOPPLER                76820.02    BURK SCHAIBLE  2  [place] MFM FETAL BPP WO NON  69629.52    [facility]     STRESS ----------------------------------------------------------------------  #  Order #                     Accession #                Episode #  1  [id number]                   [id number]                 [id number]  2  [id number]                   [id number]                 [id number] ---------------------------------------------------------------------- Indications  Maternal care for known or suspected poor      O36.5930  fetal growth, third trimester, not applicable or  unspecified IUGR  Obesity complicating pregnancy, third          O99.213  trimester  Encounter for other antenatal screening        Z36.2  follow-up  Declined testing  [redacted] weeks gestation of pregnancy                Z3A.31 ---------------------------------------------------------------------- Fetal Evaluation  Num Of Fetuses:         1  Fetal Heart Rate(bpm):  146  Cardiac Activity:       Observed  Presentation:           Cephalic  Placenta:               Posterior  P. Cord Insertion:      Previously seen  Amniotic Fluid  AFI FV:      Within normal limits  AFI Sum(cm)     %Tile       Largest Pocket(cm)  12.31           34          4.43  RUQ(cm)       RLQ(cm)       LUQ(cm)        LLQ(cm)  2.2           2.58          3.1            4.43 ---------------------------------------------------------------------- Biophysical Evaluation  Amniotic F.V:   Within normal limits       F. Tone:        Observed  F. Movement:    Observed                   Score:          8/8  F. Breathing:   Observed ---------------------------------------------------------------------- OB History  Gravidity:    4         Term:   1        Prem:   0        SAB:   2  TOP:          0       Ectopic:  0        Living: 1 ---------------------------------------------------------------------- Gestational Age  LMP:           31w 6d        Date:  09/16/22                   EDD:   06/23/23  Best:  31w 6d     Det. By:  LMP  (09/16/22)          EDD:   06/23/23  ---------------------------------------------------------------------- Anatomy  Ventricles:            Appears normal         Kidneys:                Appear normal  Stomach:               Appears normal, left   Bladder:                Appears normal                         sided ---------------------------------------------------------------------- Doppler - Fetal Vessels  Umbilical Artery   S/D     %tile      RI    %tile      PI    %tile     PSV    ADFV    RDFV                                                     (cm/s)   3.53       88    0.72       90    1.19       92    49.53      No      No ---------------------------------------------------------------------- Cervix Uterus Adnexa  Cervix  Not visualized (advanced GA >24wks)  Uterus  No abnormality visualized.  Right Ovary  Not visualized.  Left Ovary  Not visualized.  Cul De Sac  No free fluid seen.  Adnexa  No abnormality visualized ---------------------------------------------------------------------- Comments  This patient was seen due to an IUGR fetus.  She denies any  problems since her last exam.  She reports feeling vigorous  fetal movements throughout the day.  A BPP performed today was 8/8.  The total AFI was 12.31 cm (within normal limits).  Doppler studies of the umbilical arteries performed due to  fetal growth restriction showed a normal S/D ratio of 3.53.  There were no signs of absent or reversed end-diastolic flow  noted today.  She will return in 1 week for a BPP, growth scan, and  umbilical artery Doppler study. ----------------------------------------------------------------------                   Ma Rings, MD Electronically Signed Final Report   04/27/2023 05:07 pm ----------------------------------------------------------------------    Assessment and Plan:  Pregnancy: [id number] at [redacted]w[redacted]d 1. Poor fetal growth affecting management of mother, antepartum, single or unspecified fetus EFW 3.5% at 28 weeks, nml AFV, nml UAD, BPP 8/8 on  04/26/13.  Growth scan next visit, Will follow up MFM recommendations regarding delivery.  2. [redacted] weeks gestation of pregnancy 3. Supervision of high risk pregnancy in third trimester No other concerns.  Preterm labor symptoms and general obstetric precautions including but not limited to vaginal bleeding, contractions, leaking of fluid and fetal movement were reviewed in detail with the patient. Please refer to After Visit Summary for other counseling recommendations.   Return in about 2 weeks (around 05/12/2023) for OFFICE OB VISIT (MD only) - can be virtual.  Future Appointments  Date Time Provider Department Center  05/04/2023 10:00 AM [facility]  05/11/2023 11:15 AM Reva Bores, MD [facility]    Jaynie Collins, MD

## 2023-05-04 ENCOUNTER — Other Ambulatory Visit: Payer: Self-pay

## 2023-05-04 ENCOUNTER — Ambulatory Visit: Payer: Medicaid Other | Attending: Maternal & Fetal Medicine

## 2023-05-04 DIAGNOSIS — O365920 Maternal care for other known or suspected poor fetal growth, second trimester, not applicable or unspecified: Secondary | ICD-10-CM

## 2023-05-04 DIAGNOSIS — O99213 Obesity complicating pregnancy, third trimester: Secondary | ICD-10-CM | POA: Insufficient documentation

## 2023-05-04 DIAGNOSIS — E669 Obesity, unspecified: Secondary | ICD-10-CM

## 2023-05-04 DIAGNOSIS — O365930 Maternal care for other known or suspected poor fetal growth, third trimester, not applicable or unspecified: Secondary | ICD-10-CM | POA: Diagnosis not present

## 2023-05-04 DIAGNOSIS — Z3A32 32 weeks gestation of pregnancy: Secondary | ICD-10-CM | POA: Insufficient documentation

## 2023-05-06 ENCOUNTER — Other Ambulatory Visit: Payer: Self-pay | Admitting: *Deleted

## 2023-05-06 ENCOUNTER — Other Ambulatory Visit: Payer: Self-pay | Admitting: Obstetrics and Gynecology

## 2023-05-06 ENCOUNTER — Telehealth: Payer: Self-pay

## 2023-05-06 DIAGNOSIS — O365930 Maternal care for other known or suspected poor fetal growth, third trimester, not applicable or unspecified: Secondary | ICD-10-CM

## 2023-05-06 DIAGNOSIS — O99213 Obesity complicating pregnancy, third trimester: Secondary | ICD-10-CM

## 2023-05-06 NOTE — Telephone Encounter (Signed)
Left message for patient about BPP & UA Doppler scheduled in [facility] next Tuesday 11/26.  Patient will call back if questions

## 2023-05-09 ENCOUNTER — Other Ambulatory Visit: Payer: Self-pay | Admitting: *Deleted

## 2023-05-09 DIAGNOSIS — O365930 Maternal care for other known or suspected poor fetal growth, third trimester, not applicable or unspecified: Secondary | ICD-10-CM

## 2023-05-10 ENCOUNTER — Other Ambulatory Visit: Payer: Self-pay | Admitting: Maternal & Fetal Medicine

## 2023-05-10 ENCOUNTER — Other Ambulatory Visit: Payer: Self-pay

## 2023-05-10 ENCOUNTER — Ambulatory Visit: Payer: Medicaid Other | Attending: Maternal & Fetal Medicine

## 2023-05-10 DIAGNOSIS — O365930 Maternal care for other known or suspected poor fetal growth, third trimester, not applicable or unspecified: Secondary | ICD-10-CM | POA: Diagnosis present

## 2023-05-10 DIAGNOSIS — Z3A33 33 weeks gestation of pregnancy: Secondary | ICD-10-CM

## 2023-05-10 DIAGNOSIS — O99213 Obesity complicating pregnancy, third trimester: Secondary | ICD-10-CM | POA: Diagnosis not present

## 2023-05-10 DIAGNOSIS — E669 Obesity, unspecified: Secondary | ICD-10-CM | POA: Diagnosis not present

## 2023-05-11 ENCOUNTER — Telehealth: Payer: Medicaid Other | Admitting: Obstetrics and Gynecology

## 2023-05-11 DIAGNOSIS — O365930 Maternal care for other known or suspected poor fetal growth, third trimester, not applicable or unspecified: Secondary | ICD-10-CM

## 2023-05-11 DIAGNOSIS — Z3A33 33 weeks gestation of pregnancy: Secondary | ICD-10-CM

## 2023-05-11 NOTE — Patient Instructions (Signed)
Continue to check your blood pressures twice a week Call the office for blood pressures that are consistently above 140 for the top number or 90 for the bottom number   Hypertension During Pregnancy Hypertension is also called high blood pressure. High blood pressure means that the force of your blood moving in your body is too strong. It can cause problems for you and your baby. Different types of high blood pressure can happen during pregnancy. The types are: High blood pressure before you got pregnant. This is called chronic hypertension.  This can continue during your pregnancy. Your doctor will want to keep checking your blood pressure. You may need medicine to keep your blood pressure under control while you are pregnant. You will need follow-up visits after you have your baby. High blood pressure that goes up during pregnancy when it was normal before. This is called gestational hypertension. It will usually get better after you have your baby, but your doctor will need to watch your blood pressure to make sure that it is getting better. Very high blood pressure during pregnancy. This is called preeclampsia. Very high blood pressure is an emergency that needs to be checked and treated right away. You may develop very high blood pressure after giving birth. This is called postpartum preeclampsia. This usually occurs within 48 hours after childbirth but may occur up to 6 weeks after giving birth. This is rare. How does this affect me? If you have high blood pressure during pregnancy, you have a higher chance of developing high blood pressure: As you get older. If you get pregnant again. In some cases, high blood pressure during pregnancy can cause: Stroke. Heart attack. Damage to the kidneys, lungs, or liver. Preeclampsia. Jerky movements you cannot control (convulsions or seizures). Problems with the placenta.  What can I do to lower my risk?  Keep a healthy weight. Eat a healthy  diet. Follow what your doctor tells you about treating any medical problems that you had before becoming pregnant. It is very important to go to all of your doctor visits. Your doctor will check your blood pressure and make sure that your pregnancy is progressing as it should. Treatment should start early if a problem is found.  Follow these instructions at home:  Take your blood pressure 1-2 times per day. Call the office if your blood pressure is 155 or higher for the top number or 105 or higher for the bottom number.    Eating and drinking  Drink enough fluid to keep your pee (urine) pale yellow. Avoid caffeine. Lifestyle Do not use any products that contain nicotine or tobacco, such as cigarettes, e-cigarettes, and chewing tobacco. If you need help quitting, ask your doctor. Do not use alcohol or drugs. Avoid stress. Rest and get plenty of sleep. Regular exercise can help. Ask your doctor what kinds of exercise are best for you. General instructions Take over-the-counter and prescription medicines only as told by your doctor. Keep all prenatal and follow-up visits as told by your doctor. This is important. Contact a doctor if: You have symptoms that your doctor told you to watch for, such as: Headaches. Nausea. Vomiting. Belly (abdominal) pain. Dizziness. Light-headedness. Get help right away if: You have: Very bad belly pain that does not get better with treatment. A very bad headache that does not get better. Vomiting that does not get better. Sudden, fast weight gain. Sudden swelling in your hands, ankles, or face. Blood in your pee. Blurry vision. Double vision.  Shortness of breath. Chest pain. Weakness on one side of your body. Trouble talking. Summary High blood pressure is also called hypertension. High blood pressure means that the force of your blood moving in your body is too strong. High blood pressure can cause problems for you and your baby. Keep all  follow-up visits as told by your doctor. This is important. This information is not intended to replace advice given to you by your health care provider. Make sure you discuss any questions you have with your health care provider. Document Released: 07/03/2010 Document Revised: 09/21/2018 Document Reviewed: 06/27/2018 Elsevier Patient Education  2020 [employer].

## 2023-05-11 NOTE — Progress Notes (Signed)
    TELEHEALTH OBSTETRICS VISIT ENCOUNTER NOTE  Provider location: [facility]   Patient location: Home  I connected with Gabriel Carina on 05/11/23 at 11:15 AM EST by telephone at home and verified that I am speaking with the correct person using two identifiers. Of note, unable to do video encounter due to technical difficulties.    I discussed the limitations, risks, security and privacy concerns of performing an evaluation and management service by telephone and the availability of in person appointments. I also discussed with the patient that there may be a patient responsible charge related to this service. The patient expressed understanding and agreed to proceed.  Subjective:  Yvonne Hood is a 27 y.o. [phone] at [redacted]w[redacted]d being followed for ongoing prenatal care.  She is currently monitored for the following issues for this high-risk pregnancy and has Migraine without aura; Rubella non-immune status, antepartum; Anxiety and depression; Abnormal Papanicolaou smear of cervix; Supervision of high-risk pregnancy; Obesity in pregnancy; BMI 30.0-30.9,adult; and IUGR (intrauterine growth restriction) affecting care of mother on their problem list.  Patient reports no complaints. Reports fetal movement. Denies any contractions, bleeding or leaking of fluid.   The following portions of the patient's history were reviewed and updated as appropriate: allergies, current medications, past family history, past medical history, past social history, past surgical history and problem list.   Objective:  Last menstrual period 09/16/2022, unknown if currently breastfeeding. General:  Alert, oriented and cooperative.   Mental Status: Normal mood and affect perceived. Normal judgment and thought content.  Rest of physical exam deferred due to type of encounter  Assessment and Plan:  Pregnancy: G4P1021 at [redacted]w[redacted]d 1. [redacted] weeks gestation of pregnancy  2. Poor  fetal growth affecting management of mother in third trimester, single or unspecified fetus Continue weekly monitoring. I d/w her delivery timing will be based on 12/11 repeat growth but likely around 37-38wks 11/26: cephalic, afi 12, 8/8, UA wnl 78/46: 8%, ac 19%, 8/8, afi 12, UA wnl  Preterm labor symptoms and general obstetric precautions including but not limited to vaginal bleeding, contractions, leaking of fluid and fetal movement were reviewed in detail with the patient.  I discussed the assessment and treatment plan with the patient. The patient was provided an opportunity to ask questions and all were answered. The patient agreed with the plan and demonstrated an understanding of the instructions. The patient was advised to call back or seek an in-person office evaluation/go to [facility] at [facility] for any urgent or concerning symptoms. Please refer to After Visit Summary for other counseling recommendations.   I provided 7 minutes of non-face-to-face time during this encounter.  Return in about 15 days (around 05/26/2023) for in person, md visit, high risk ob.  Future Appointments  Date Time Provider Department Center  05/18/2023  9:00 AM [facility]  05/25/2023  2:30 PM [facility]  05/26/2023 10:55 AM Constant, Gigi Gin, MD [facility]  06/01/2023  2:30 PM [facility]  06/13/2023  8:00 AM [facility]     Bing, MD [facility] [employer], [facility]

## 2023-05-18 ENCOUNTER — Ambulatory Visit: Payer: Medicaid Other | Attending: Maternal & Fetal Medicine

## 2023-05-18 ENCOUNTER — Other Ambulatory Visit: Payer: Self-pay

## 2023-05-18 DIAGNOSIS — O365930 Maternal care for other known or suspected poor fetal growth, third trimester, not applicable or unspecified: Secondary | ICD-10-CM | POA: Diagnosis not present

## 2023-05-18 DIAGNOSIS — O99213 Obesity complicating pregnancy, third trimester: Secondary | ICD-10-CM | POA: Insufficient documentation

## 2023-05-18 DIAGNOSIS — E669 Obesity, unspecified: Secondary | ICD-10-CM

## 2023-05-18 DIAGNOSIS — Z3A34 34 weeks gestation of pregnancy: Secondary | ICD-10-CM | POA: Diagnosis not present

## 2023-05-25 ENCOUNTER — Other Ambulatory Visit: Payer: Self-pay

## 2023-05-25 ENCOUNTER — Ambulatory Visit: Payer: Medicaid Other | Attending: Obstetrics

## 2023-05-25 DIAGNOSIS — O99213 Obesity complicating pregnancy, third trimester: Secondary | ICD-10-CM | POA: Diagnosis not present

## 2023-05-25 DIAGNOSIS — Z3A35 35 weeks gestation of pregnancy: Secondary | ICD-10-CM | POA: Diagnosis not present

## 2023-05-25 DIAGNOSIS — Z362 Encounter for other antenatal screening follow-up: Secondary | ICD-10-CM | POA: Insufficient documentation

## 2023-05-25 DIAGNOSIS — O365930 Maternal care for other known or suspected poor fetal growth, third trimester, not applicable or unspecified: Secondary | ICD-10-CM | POA: Diagnosis not present

## 2023-05-25 DIAGNOSIS — E669 Obesity, unspecified: Secondary | ICD-10-CM | POA: Diagnosis not present

## 2023-05-26 ENCOUNTER — Encounter: Payer: Self-pay | Admitting: Obstetrics and Gynecology

## 2023-05-26 ENCOUNTER — Ambulatory Visit: Payer: Medicaid Other | Admitting: Obstetrics and Gynecology

## 2023-05-26 ENCOUNTER — Other Ambulatory Visit (HOSPITAL_COMMUNITY)
Admission: RE | Admit: 2023-05-26 | Discharge: 2023-05-26 | Disposition: A | Discharge status: Home / Self Care | Payer: Medicaid Other | Source: Ambulatory Visit | Attending: Obstetrics and Gynecology | Admitting: Obstetrics and Gynecology

## 2023-05-26 VITALS — BP 130/86 | HR 80 | Wt 166.0 lb

## 2023-05-26 DIAGNOSIS — O365930 Maternal care for other known or suspected poor fetal growth, third trimester, not applicable or unspecified: Secondary | ICD-10-CM

## 2023-05-26 DIAGNOSIS — Z2839 Other underimmunization status: Secondary | ICD-10-CM | POA: Diagnosis not present

## 2023-05-26 DIAGNOSIS — Z3A36 36 weeks gestation of pregnancy: Secondary | ICD-10-CM | POA: Insufficient documentation

## 2023-05-26 DIAGNOSIS — E669 Obesity, unspecified: Secondary | ICD-10-CM

## 2023-05-26 DIAGNOSIS — O99213 Obesity complicating pregnancy, third trimester: Secondary | ICD-10-CM

## 2023-05-26 DIAGNOSIS — Z2911 Encounter for prophylactic immunotherapy for respiratory syncytial virus (RSV): Secondary | ICD-10-CM | POA: Diagnosis not present

## 2023-05-26 DIAGNOSIS — Z01812 Encounter for preprocedural laboratory examination: Secondary | ICD-10-CM

## 2023-05-26 DIAGNOSIS — O0993 Supervision of high risk pregnancy, unspecified, third trimester: Secondary | ICD-10-CM

## 2023-05-26 DIAGNOSIS — O09899 Supervision of other high risk pregnancies, unspecified trimester: Secondary | ICD-10-CM

## 2023-05-26 DIAGNOSIS — O99210 Obesity complicating pregnancy, unspecified trimester: Secondary | ICD-10-CM

## 2023-05-26 NOTE — Addendum Note (Signed)
Addended by: Kennon Portela on: 05/26/2023 11:40 AM   Modules accepted: Orders

## 2023-05-26 NOTE — Progress Notes (Signed)
   PRENATAL VISIT NOTE  Subjective:  Yvonne Hood is a 27 y.o. [phone] at [redacted]w[redacted]d being seen today for ongoing prenatal care.  She is currently monitored for the following issues for this high-risk pregnancy and has Migraine without aura; Rubella non-immune status, antepartum; Anxiety and depression; Abnormal Papanicolaou smear of cervix; Supervision of high-risk pregnancy; Obesity in pregnancy; BMI 30.0-30.9,adult; and IUGR (intrauterine growth restriction) affecting care of mother on their problem list.  Patient reports no complaints.  Contractions: Irritability. Vag. Bleeding: None.  Movement: Present. Denies leaking of fluid.   The following portions of the patient's history were reviewed and updated as appropriate: allergies, current medications, past family history, past medical history, past social history, past surgical history and problem list.   Objective:   Vitals:   05/26/23 1112  BP: 130/86  Pulse: 80  Weight: 166 lb (75.3 kg)    Fetal Status: Fetal Heart Rate (bpm): 126 Fundal Height: 33 cm Movement: Present     General:  Alert, oriented and cooperative. Patient is in no acute distress.  Skin: Skin is warm and dry. No rash noted.   Cardiovascular: Normal heart rate noted  Respiratory: Normal respiratory effort, no problems with respiration noted  Abdomen: Soft, gravid, appropriate for gestational age.  Pain/Pressure: Present     Pelvic: Cervical exam performed in the presence of a chaperone Dilation: 1 Effacement (%): Thick Station: -3  Extremities: Normal range of motion.  Edema: None  Mental Status: Normal mood and affect. Normal behavior. Normal judgment and thought content.   Assessment and Plan:  Pregnancy: G4P1021 at [redacted]w[redacted]d 1. [redacted] weeks gestation of pregnancy (Primary) - Cervicovaginal ancillary only - Strep Gp B NAA  2. Supervision of high risk pregnancy in third trimester Patient is doing well without complaints Cultures today Patient plans BTL Patient  desires RSV vaccine today. Patient made aware that she may not have time to get fetal protection benefits given plan for IOL next week - Cervicovaginal ancillary only - Strep Gp B NAA  3. Poor fetal growth affecting management of mother in third trimester, single or unspecified fetus 05/25/23 EFW 2117 gm 3%tile with BPP 8/8 Scheduled for IOL at 37 weeks Follow BPP next week  4. Obesity in pregnancy   5. Rubella non-immune status, antepartum Will offer pp  Preterm labor symptoms and general obstetric precautions including but not limited to vaginal bleeding, contractions, leaking of fluid and fetal movement were reviewed in detail with the patient. Please refer to After Visit Summary for other counseling recommendations.   Return in about 6 weeks (around 07/07/2023) for postpartum.  Future Appointments  Date Time Provider Department Center  06/01/2023  2:30 PM [facility]  06/02/2023  6:30 AM [facility] SCHED ROOM [facility] None  06/06/2023 10:45 AM [facility] NST [facility]    Catalina Antigua, MD

## 2023-05-27 ENCOUNTER — Encounter (HOSPITAL_COMMUNITY): Payer: Self-pay | Admitting: *Deleted

## 2023-05-27 ENCOUNTER — Telehealth (HOSPITAL_COMMUNITY): Payer: Self-pay | Admitting: *Deleted

## 2023-05-27 LAB — CERVICOVAGINAL ANCILLARY ONLY
Chlamydia: NEGATIVE
Comment: NEGATIVE
Comment: NORMAL
Neisseria Gonorrhea: NEGATIVE

## 2023-05-27 NOTE — Telephone Encounter (Signed)
Preadmission screen  

## 2023-05-28 LAB — STREP GP B NAA: Strep Gp B NAA: NEGATIVE

## 2023-05-29 ENCOUNTER — Other Ambulatory Visit: Payer: Self-pay | Admitting: Advanced Practice Midwife

## 2023-06-01 ENCOUNTER — Other Ambulatory Visit: Payer: Self-pay | Admitting: Advanced Practice Midwife

## 2023-06-01 ENCOUNTER — Other Ambulatory Visit: Payer: Medicaid Other

## 2023-06-02 ENCOUNTER — Encounter (HOSPITAL_COMMUNITY): Payer: Self-pay | Admitting: Obstetrics and Gynecology

## 2023-06-02 ENCOUNTER — Inpatient Hospital Stay (HOSPITAL_COMMUNITY): Payer: Medicaid Other | Admitting: Anesthesiology

## 2023-06-02 ENCOUNTER — Inpatient Hospital Stay (HOSPITAL_COMMUNITY): Payer: Medicaid Other

## 2023-06-02 ENCOUNTER — Inpatient Hospital Stay (HOSPITAL_COMMUNITY)
Admission: RE | Admit: 2023-06-02 | Discharge: 2023-06-05 | DRG: 798 | Disposition: A | Discharge status: Home / Self Care | Payer: Medicaid Other | Attending: Obstetrics and Gynecology | Admitting: Obstetrics and Gynecology

## 2023-06-02 DIAGNOSIS — O9902 Anemia complicating childbirth: Secondary | ICD-10-CM | POA: Diagnosis present

## 2023-06-02 DIAGNOSIS — Z833 Family history of diabetes mellitus: Secondary | ICD-10-CM

## 2023-06-02 DIAGNOSIS — O99214 Obesity complicating childbirth: Secondary | ICD-10-CM | POA: Diagnosis present

## 2023-06-02 DIAGNOSIS — Z886 Allergy status to analgesic agent status: Secondary | ICD-10-CM | POA: Diagnosis not present

## 2023-06-02 DIAGNOSIS — Z9851 Tubal ligation status: Secondary | ICD-10-CM

## 2023-06-02 DIAGNOSIS — Z01812 Encounter for preprocedural laboratory examination: Secondary | ICD-10-CM

## 2023-06-02 DIAGNOSIS — Z23 Encounter for immunization: Secondary | ICD-10-CM

## 2023-06-02 DIAGNOSIS — Z302 Encounter for sterilization: Secondary | ICD-10-CM

## 2023-06-02 DIAGNOSIS — Z3A37 37 weeks gestation of pregnancy: Secondary | ICD-10-CM

## 2023-06-02 DIAGNOSIS — Z3490 Encounter for supervision of normal pregnancy, unspecified, unspecified trimester: Principal | ICD-10-CM | POA: Diagnosis present

## 2023-06-02 DIAGNOSIS — N871 Moderate cervical dysplasia: Secondary | ICD-10-CM

## 2023-06-02 DIAGNOSIS — O99344 Other mental disorders complicating childbirth: Secondary | ICD-10-CM | POA: Diagnosis not present

## 2023-06-02 DIAGNOSIS — Z8249 Family history of ischemic heart disease and other diseases of the circulatory system: Secondary | ICD-10-CM | POA: Diagnosis not present

## 2023-06-02 DIAGNOSIS — O365930 Maternal care for other known or suspected poor fetal growth, third trimester, not applicable or unspecified: Principal | ICD-10-CM | POA: Diagnosis present

## 2023-06-02 LAB — CBC
HCT: 33.8 % — ABNORMAL LOW (ref 36.0–46.0)
Hemoglobin: 10.8 g/dL — ABNORMAL LOW (ref 12.0–15.0)
MCH: 26.9 pg (ref 26.0–34.0)
MCHC: 32 g/dL (ref 30.0–36.0)
MCV: 84.1 fL (ref 80.0–100.0)
Platelets: 302 10*3/uL (ref 150–400)
RBC: 4.02 MIL/uL (ref 3.87–5.11)
RDW: 14 % (ref 11.5–15.5)
WBC: 10.5 10*3/uL (ref 4.0–10.5)
nRBC: 0 % (ref 0.0–0.2)

## 2023-06-02 LAB — TYPE AND SCREEN
ABO/RH(D): A POS
Antibody Screen: NEGATIVE

## 2023-06-02 LAB — RPR: RPR Ser Ql: NONREACTIVE

## 2023-06-02 MED ORDER — LACTATED RINGERS IV SOLN: INTRAVENOUS | Status: DC

## 2023-06-02 MED ORDER — FENTANYL-BUPIVACAINE-NACL 0.5-0.125-0.9 MG/250ML-% EP SOLN
EPIDURAL | Status: DC | PRN
  Administered 2023-06-02: 12 mL/h via EPIDURAL

## 2023-06-02 MED ORDER — EPHEDRINE 5 MG/ML INJ: 10.0000 mg | INTRAVENOUS | Status: DC | PRN

## 2023-06-02 MED ORDER — ACETAMINOPHEN 325 MG PO TABS: 650.0000 mg | ORAL_TABLET | ORAL | Status: DC | PRN

## 2023-06-02 MED ORDER — PHENYLEPHRINE 80 MCG/ML (10ML) SYRINGE FOR IV PUSH (FOR BLOOD PRESSURE SUPPORT)
80.0000 ug | PREFILLED_SYRINGE | INTRAVENOUS | Status: DC | PRN
  Filled 2023-06-02: qty 10

## 2023-06-02 MED ORDER — OXYCODONE-ACETAMINOPHEN 5-325 MG PO TABS: 2.0000 | ORAL_TABLET | ORAL | Status: DC | PRN

## 2023-06-02 MED ORDER — OXYTOCIN-SODIUM CHLORIDE 30-0.9 UT/500ML-% IV SOLN
1.0000 m[IU]/min | INTRAVENOUS | Status: DC
  Administered 2023-06-02: 4 m[IU]/min via INTRAVENOUS
  Administered 2023-06-02: 2 m[IU]/min via INTRAVENOUS
  Filled 2023-06-02: qty 500

## 2023-06-02 MED ORDER — FENTANYL-BUPIVACAINE-NACL 0.5-0.125-0.9 MG/250ML-% EP SOLN
12.0000 mL/h | EPIDURAL | Status: DC | PRN
  Filled 2023-06-02: qty 250

## 2023-06-02 MED ORDER — OXYTOCIN-SODIUM CHLORIDE 30-0.9 UT/500ML-% IV SOLN
2.5000 [IU]/h | INTRAVENOUS | Status: DC
  Administered 2023-06-02: 2.5 [IU]/h via INTRAVENOUS

## 2023-06-02 MED ORDER — PHENYLEPHRINE 80 MCG/ML (10ML) SYRINGE FOR IV PUSH (FOR BLOOD PRESSURE SUPPORT)
80.0000 ug | PREFILLED_SYRINGE | INTRAVENOUS | Status: DC | PRN
Start: 2023-06-02 — End: 2023-06-03

## 2023-06-02 MED ORDER — FENTANYL CITRATE (PF) 100 MCG/2ML IJ SOLN: 100.0000 ug | INTRAMUSCULAR | Status: DC | PRN

## 2023-06-02 MED ORDER — OXYTOCIN BOLUS FROM INFUSION
333.0000 mL | Freq: Once | INTRAVENOUS | Status: AC
  Administered 2023-06-02: 333 mL via INTRAVENOUS

## 2023-06-02 MED ORDER — DIPHENHYDRAMINE HCL 50 MG/ML IJ SOLN: 12.5000 mg | INTRAMUSCULAR | Status: DC | PRN

## 2023-06-02 MED ORDER — OXYCODONE-ACETAMINOPHEN 5-325 MG PO TABS: 1.0000 | ORAL_TABLET | ORAL | Status: DC | PRN

## 2023-06-02 MED ORDER — SOD CITRATE-CITRIC ACID 500-334 MG/5ML PO SOLN: 30.0000 mL | ORAL | Status: DC | PRN

## 2023-06-02 MED ORDER — TERBUTALINE SULFATE 1 MG/ML IJ SOLN: 0.2500 mg | Freq: Once | INTRAMUSCULAR | Status: DC | PRN

## 2023-06-02 MED ORDER — MISOPROSTOL 25 MCG QUARTER TABLET
25.0000 ug | ORAL_TABLET | Freq: Once | ORAL | Status: AC
  Administered 2023-06-02: 25 ug via ORAL
  Filled 2023-06-02: qty 1

## 2023-06-02 MED ORDER — LACTATED RINGERS IV SOLN
500.0000 mL | INTRAVENOUS | Status: DC | PRN
Start: 2023-06-02 — End: 2023-06-03
  Administered 2023-06-02: 500 mL via INTRAVENOUS

## 2023-06-02 MED ORDER — LIDOCAINE HCL (PF) 1 % IJ SOLN
INTRAMUSCULAR | Status: DC | PRN
  Administered 2023-06-02: 2 mL via EPIDURAL
  Administered 2023-06-02: 8 mL via EPIDURAL

## 2023-06-02 MED ORDER — LIDOCAINE HCL (PF) 1 % IJ SOLN: 30.0000 mL | INTRAMUSCULAR | Status: DC | PRN

## 2023-06-02 MED ORDER — ONDANSETRON HCL 4 MG/2ML IJ SOLN: 4.0000 mg | Freq: Four times a day (QID) | INTRAMUSCULAR | Status: DC | PRN

## 2023-06-02 NOTE — Progress Notes (Signed)
Labor Progress Note Yvonne Hood is a 27 y.o. [phone] at [redacted]w[redacted]d presented for IOL for IUGR (3%)  S: Patient feeling more pressure. Has epidural.   O:  BP 118/71   Pulse 79   Temp 98 F (36.7 C) (Oral)   Resp 16   Ht 4\' 11"  (1.499 m)   Wt 75.8 kg   LMP 09/16/2022   SpO2 100%   BMI 33.73 kg/m  EFM: 125bpm/mod variability/+accels/no decels  CVE: Dilation: 5 Effacement (%): 70 Cervical Position: Middle Station: -2 Presentation: Vertex Exam by:: Dr. Sherron Ales   A&P: 27 y.o. [id number] [redacted]w[redacted]d here for IOL for IUGR  #Labor: Progressing well. Feeling some more pressure.  #Pain: Epidural #FWB: Cat 1 #GBS negative   Gwenlyn Perking, MD 9:42 PM

## 2023-06-02 NOTE — Anesthesia Preprocedure Evaluation (Signed)
Anesthesia Evaluation  Patient identified by MRN, date of birth, ID band Patient awake    Reviewed: Allergy & Precautions, Patient's Chart, lab work & pertinent test results  Airway Mallampati: II  TM Distance: >3 FB Neck ROM: Full    Dental no notable dental hx.    Pulmonary neg pulmonary ROS   Pulmonary exam normal breath sounds clear to auscultation       Cardiovascular negative cardio ROS Normal cardiovascular exam Rhythm:Regular Rate:Normal     Neuro/Psych  Headaches PSYCHIATRIC DISORDERS Anxiety Depression       GI/Hepatic negative GI ROS, Neg liver ROS,,,  Endo/Other  Obesity BMI 34  Renal/GU negative Renal ROS  negative genitourinary   Musculoskeletal negative musculoskeletal ROS (+)    Abdominal   Peds negative pediatric ROS (+)  Hematology  (+) Blood dyscrasia, anemia Hb 10.8, plt 302   Anesthesia Other Findings   Reproductive/Obstetrics (+) Pregnancy                             Anesthesia Physical Anesthesia Plan  ASA: 2  Anesthesia Plan: Epidural   Post-op Pain Management:    Induction:   PONV Risk Score and Plan: 2  Airway Management Planned: Natural Airway  Additional Equipment: None  Intra-op Plan:   Post-operative Plan:   Informed Consent: I have reviewed the patients History and Physical, chart, labs and discussed the procedure including the risks, benefits and alternatives for the proposed anesthesia with the patient or authorized representative who has indicated his/her understanding and acceptance.       Plan Discussed with:   Anesthesia Plan Comments:        Anesthesia Quick Evaluation

## 2023-06-02 NOTE — Discharge Summary (Signed)
Postpartum Discharge Summary  Date of Service updated 06/05/2023     Patient Name: Yvonne Hood DOB: 07-May-1996 MRN: [id number]  Date of admission: 06/02/2023 Delivery date:06/02/2023 Delivering provider: CHUBB, CASEY C Date of discharge: 06/05/2023  Admitting diagnosis: Encounter for induction of labor [Z34.90] Intrauterine pregnancy: [redacted]w[redacted]d     Secondary diagnosis:  Principal Problem:   Encounter for induction of labor Active Problems:   NSVD (normal spontaneous vaginal delivery)   Encounter for female sterilization procedure   S/P tubal ligation  Additional problems: Postpartum BTL completed    Discharge diagnosis: Term Pregnancy Delivered and FGR                                               Post partum procedures:postpartum tubal ligation Augmentation: AROM, Pitocin, Cytotec, and IP Foley Complications: None  Hospital course: Induction of Labor With Vaginal Delivery   27 y.o. yo [id number] at [redacted]w[redacted]d was admitted to the hospital 06/02/2023 for induction of labor.  Indication for induction:  FGR (3%) .  Patient had an labor course complicated by precipitous delivery.  Per nursing report one practice push and baby was delivered.   Membrane Rupture Time/Date: 6:24 PM,06/02/2023  Delivery Method:Vaginal, Spontaneous Operative Delivery:N/A Episiotomy: None Lacerations:  None Details of delivery can be found in separate delivery note.  Patient had a postpartum course uncomplicated. Patient is discharged home 06/05/23.  Newborn Data: Birth date:06/02/2023 Birth time:10:16 PM Gender:Female Living status:Living Apgars:9 ,9  Weight:2300 g  Magnesium Sulfate received: No BMZ received: No Rhophylac:N/A JYN:[id number] T-DaP:declined Flu: declined prenatally  RSV Vaccine received: Yes, 05/26/2023 Transfusion:No  Immunizations received: Immunization History  Administered Date(s) Administered   Influenza Split 07/13/2011, 04/25/2012   Influenza,inj,Quad PF,6+ Mos  04/11/2015, 05/10/2018, 06/04/2020   Influenza-Unspecified 03/13/2014   Rsv, Bivalent, Protein Subunit Rsvpref,pf Verdis Frederickson) 05/26/2023   Tdap 05/02/2015    Physical exam  Vitals:   06/04/23 1630 06/04/23 2030 06/05/23 0030 06/05/23 0510  BP: 132/78 102/68 123/79 115/75  Pulse: 79 95 68 85  Resp: 17 17 18 18   Temp: 99.1 F (37.3 C) 98.7 F (37.1 C) 98.7 F (37.1 C) 97.9 F (36.6 C)  TempSrc: Oral Oral Oral Oral  SpO2: 97% 95% 97% 99%  Weight:      Height:       General: alert, cooperative, and no distress Lochia: appropriate Uterine Fundus: firm Incision: Healing well with no significant drainage, No significant erythema, Dressing is clean, dry, and intact DVT Evaluation: No evidence of DVT seen on physical exam. Negative Homan's sign. No cords or calf tenderness. Labs: Lab Results  Component Value Date   WBC 19.8 (H) 06/03/2023   HGB 11.3 (L) 06/03/2023   HCT 34.9 (L) 06/03/2023   MCV 83.3 06/03/2023   PLT 311 06/03/2023      Latest Ref Rng & Units 12/10/2022   11:05 AM  CMP  Glucose 70 - 99 mg/dL 80   BUN 6 - 20 mg/dL 10   Creatinine 0.86 - 1.00 mg/dL 5.78   Sodium 469 - 629 mmol/L 136   Potassium 3.5 - 5.2 mmol/L 4.3   Chloride 96 - 106 mmol/L 100   CO2 20 - 29 mmol/L 22   Calcium 8.7 - 10.2 mg/dL 9.7   Total Protein 6.0 - 8.5 g/dL 6.6   Total Bilirubin 0.0 - 1.2 mg/dL 0.2   Alkaline Phos  44 - 121 IU/L 100   AST 0 - 40 IU/L 25   ALT 0 - 32 IU/L 25    Edinburgh Score:    06/03/2023    3:28 PM  Edinburgh Postnatal Depression Scale Screening Tool  I have been able to laugh and see the funny side of things. 0  I have looked forward with enjoyment to things. 1  I have blamed myself unnecessarily when things went wrong. 2  I have been anxious or worried for no good reason. 2  I have felt scared or panicky for no good reason. 0  Things have been getting on top of me. 0  I have been so unhappy that I have had difficulty sleeping. 0  I have felt sad or  miserable. 1  I have been so unhappy that I have been crying. 0  The thought of harming myself has occurred to me. 0  Edinburgh Postnatal Depression Scale Total 6   No data recorded  After visit meds:  Allergies as of 06/05/2023       Reactions   Bee Venom Shortness Of Breath   Ibuprofen Hives, Swelling, Rash        Medication List     TAKE these medications    acetaminophen 325 MG tablet Commonly known as: TYLENOL Take 650 mg by mouth every 6 (six) hours as needed for headache. What changed: Another medication with the same name was added. Make sure you understand how and when to take each.   acetaminophen 325 MG tablet Commonly known as: Tylenol Take 2 tablets (650 mg total) by mouth every 4 (four) hours as needed (for pain scale < 4). What changed: You were already taking a medication with the same name, and this prescription was added. Make sure you understand how and when to take each.   coconut oil Oil Apply 1 Application topically as needed.   Excedrin Tension Headache 500-65 MG Tabs per tablet Generic drug: acetaminophen-caffeine Take 2 tablets by mouth every 6 (six) hours as needed.   famotidine 20 MG tablet Commonly known as: PEPCID Take 1 tablet (20 mg total) by mouth 2 (two) times daily.   Oxycodone HCl 10 MG Tabs Take 1 tablet (10 mg total) by mouth every 4 (four) hours as needed for severe pain (pain score 7-10) or breakthrough pain.   simethicone 80 MG chewable tablet Commonly known as: MYLICON Chew 1 tablet (80 mg total) by mouth as needed for flatulence.   SUMAtriptan 100 MG tablet Commonly known as: IMITREX Take 1 tablet (100 mg total) by mouth once as needed for up to 1 dose for migraine. May repeat in 2 hours if headache persists or recurs.   witch hazel-glycerin pad Commonly known as: TUCKS Apply 1 Application topically as needed for hemorrhoids.         Discharge home in stable condition Infant Feeding: Bottle Infant  Disposition:home with mother Discharge instruction: per After Visit Summary and Postpartum booklet. Activity: Advance as tolerated. Pelvic rest for 6 weeks.  Diet: routine diet Future Appointments: Future Appointments  Date Time Provider Department Center  06/09/2023  2:50 PM [facility] NURSE [facility]  07/14/2023  3:10 PM Anyanwu, Jethro Bastos, MD [facility]   Follow up Visit:  Follow-up Information     [facility] Follow up.   Specialty: Obstetrics and Gynecology Contact information: [address] [place] [address] [phone]  Message sent to [facility] 12/19  Please schedule this patient for a In person postpartum visit in 4 weeks with the following provider: Any provider. 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

## 2023-06-02 NOTE — Progress Notes (Signed)
Labor Progress Note Yvonne Hood is a 27 y.o. [phone] at [redacted]w[redacted]d presented for IOL for IUGR (3%ile)  S: Foley bulb only in for about an hour. Seems to be progressing, very comfortable with epidural. Not feeling contractions.   O:  BP (!) 118/102   Pulse 95   Temp 98.7 F (37.1 C) (Oral)   Resp 18   Ht 4\' 11"  (1.499 m)   Wt 75.8 kg   LMP 09/16/2022   BMI 33.73 kg/m  EFM: 125/moderate/15x15  CVE: Dilation: 4 Effacement (%): 70 Cervical Position: Middle Station: -3 Presentation: Vertex Exam by:: Dr. Lucianne Muss   A&P: 27 y.o. [id number] [redacted]w[redacted]d IOL for IUGR (3%ile) #Labor: Progressing well. AROM (clear fluid) and IUPC placed. Patient tolerated both procedures well.  #Pain: Epidural in place. Remains comfortable.  #FWB: Cat 1 #GBS negative   J Dorothyann Gibbs, MD 6:34 PM

## 2023-06-02 NOTE — Progress Notes (Signed)
Spoke with patient at beside who still desires PP BTL understanding not reversible.  Spoke with OB OR Charge nurse who states cannot be done tonight and unable to post time given day shift OR schedule.  Pt will be NPO after midnight and will check with day team about getting pt posted as soon as possible.   Dr. Judd Lien

## 2023-06-02 NOTE — Progress Notes (Addendum)
Labor Progress Note Yvonne Hood is a 27 y.o. [phone] at 108w0d presented for IOL due to IUGR (3%ile).  S: Feeling well, some cramping with contractions but generally comfortable s/p cytotec.    O:  BP (!) 105/58   Pulse 76   Temp 98.7 F (37.1 C) (Oral)   Resp 18   Ht 4\' 11"  (1.499 m)   Wt 75.8 kg   LMP 09/16/2022   BMI 33.73 kg/m  EFM: 120/moderate/15x15 accels/-d  CVE: Dilation: 1 Effacement (%): 50 Cervical Position: Middle Station: -3 Presentation: Vertex Exam by:: Dr. Lucianne Muss   A&P: 27 y.o. A6T0160 [redacted]w[redacted]d IOL for IUGR #Labor: Some effacement with cytotec but minimal cervical dilation. Foley balloon (40mL) placed by Dr. Lucianne Muss #Pain: IV fentanyl PRN, planning epidural #FWB: Cat 1 #GBS negative   J Dorothyann Gibbs, MD 1:48 PM  Attestation of Supervision of Resident:  I confirm that I have verified the information documented in the resident's note and that I have also personally placed FB.  I have verified that all services and findings are accurately documented in this resident's note; and I agree with management and plan as outlined in the documentation. I have also made any necessary editorial changes.  Sundra Aland, MD OB Fellow, Faculty Practice [facility]

## 2023-06-02 NOTE — Anesthesia Procedure Notes (Signed)
Epidural Patient location during procedure: OB Start time: 06/02/2023 3:13 PM End time: 06/02/2023 3:23 PM  Staffing Anesthesiologist: Lannie Fields, DO Performed: anesthesiologist   Preanesthetic Checklist Completed: patient identified, IV checked, risks and benefits discussed, monitors and equipment checked, pre-op evaluation and timeout performed  Epidural Patient position: sitting Prep: DuraPrep and site prepped and draped Patient monitoring: continuous pulse ox, blood pressure, heart rate and cardiac monitor Approach: midline Location: L3-L4 Injection technique: LOR air  Needle:  Needle type: Tuohy  Needle gauge: 17 G Needle length: 9 cm Needle insertion depth: 5 cm Catheter type: closed end flexible Catheter size: 19 Gauge Catheter at skin depth: 10 cm Test dose: negative  Assessment Sensory level: T8 Events: blood not aspirated, no cerebrospinal fluid, injection not painful, no injection resistance, no paresthesia and negative IV test  Additional Notes Patient identified. Risks/Benefits/Options discussed with patient including but not limited to bleeding, infection, nerve damage, paralysis, failed block, incomplete pain control, headache, blood pressure changes, nausea, vomiting, reactions to medication both or allergic, itching and postpartum back pain. Confirmed with bedside nurse the patient's most recent platelet count. Confirmed with patient that they are not currently taking any anticoagulation, have any bleeding history or any family history of bleeding disorders. Patient expressed understanding and wished to proceed. All questions were answered. Sterile technique was used throughout the entire procedure. Please see nursing notes for vital signs. Test dose was given through epidural catheter and negative prior to continuing to dose epidural or start infusion. Warning signs of high block given to the patient including shortness of breath, tingling/numbness in  hands, complete motor block, or any concerning symptoms with instructions to call for help. Patient was given instructions on fall risk and not to get out of bed. All questions and concerns addressed with instructions to call with any issues or inadequate analgesia.  Reason for block:procedure for pain

## 2023-06-02 NOTE — H&P (Addendum)
OBSTETRIC ADMISSION HISTORY AND PHYSICAL  Yvonne Hood is a 27 y.o. female [phone] with IUP at [redacted]w[redacted]d by LMP c/w 12 week [place] presenting for IOL for IUGR (3 %ile). She reports +FMs, No LOF, no VB, no blurry vision, headaches or peripheral edema, and RUQ pain.  She plans on bottle feeding. She request BTL for birth control. She received her prenatal care at  [facility]    Dating: By LMP c/w 12 wk [place] --->  Estimated Date of Delivery: 06/23/23  Sono:    @[redacted]w[redacted]d , CWD, normal anatomy, cephalic presentation, 2117g, 3% EFW, normal dopplers with S/D 2.73, no e/o AEDF or REDF   Prenatal History/Complications:  IUGR, maternal obesity, anxiety and depression, rubella non-immune   Past Medical History: Past Medical History:  Diagnosis Date   Chlamydia 03/06/2021   Cholelithiasis 01/04/2018   Headache(784.0)    Headache, rebound 02/11/2015   Discontinued caffeine August 2016   Hearing loss    Bilateral aids    Past Surgical History: Past Surgical History:  Procedure Laterality Date   CHOLECYSTECTOMY N/A 01/04/2018   Procedure: LAPAROSCOPIC CHOLECYSTECTOMY;  Surgeon: Carolan Shiver, MD;  Location: [facility] ORS;  Service: General;  Laterality: N/A;   INNER EAR SURGERY     left ear    Obstetrical History: OB History     Gravida  4   Para  1   Term  1   Preterm  0   AB  2   Living  1      SAB  2   IAB  0   Ectopic  0   Multiple  0   Live Births  1           Social History Social History   Socioeconomic History   Marital status: Single    Spouse name: John-has 2 kids also   Number of children: 1   Years of education: Not on file   Highest education level: Not on file  Occupational History   Occupation: Lobbyist: [employer]  Tobacco Use   Smoking status: Never   Smokeless tobacco: Never  Vaping Use   Vaping status: Never Used  Substance and Sexual Activity   Alcohol use: No   Drug use: No   Sexual activity: Yes    Partners: Male    Birth  control/protection: None  Other Topics Concern   Not on file  Social History Narrative   Not on file   Social Drivers of Health   Financial Resource Strain: Not on file  Food Insecurity: No Food Insecurity (06/02/2023)   Hunger Vital Sign    Worried About Running Out of Food in the Last Year: Never true    Ran Out of Food in the Last Year: Never true  Transportation Needs: No Transportation Needs (06/02/2023)   PRAPARE - Administrator, Civil Service (Medical): No    Lack of Transportation (Non-Medical): No  Physical Activity: Not on file  Stress: Not on file  Social Connections: Not on file    Family History: Family History  Problem Relation Age of Onset   Diabetes Mother    COPD Mother    Heart disease Father    Asthma Brother    Cardiomyopathy Brother    Other Brother        Mental delayed. Superfical tumors on head.   ADD / ADHD Brother    Asthma Brother     Allergies: Allergies  Allergen Reactions   Bee Venom  Shortness Of Breath   Ibuprofen Hives, Swelling and Rash    Medications Prior to Admission  Medication Sig Dispense Refill Last Dose/Taking   acetaminophen (TYLENOL) 325 MG tablet Take 650 mg by mouth every 6 (six) hours as needed for headache. (Patient not taking: Reported on 05/26/2023)      acetaminophen-caffeine (EXCEDRIN TENSION HEADACHE) 500-65 MG TABS per tablet Take 2 tablets by mouth every 6 (six) hours as needed. (Patient not taking: Reported on 05/26/2023) 100 tablet 0    famotidine (PEPCID) 20 MG tablet Take 1 tablet (20 mg total) by mouth 2 (two) times daily. (Patient not taking: Reported on 05/26/2023) 60 tablet 3    SUMAtriptan (IMITREX) 100 MG tablet Take 1 tablet (100 mg total) by mouth once as needed for up to 1 dose for migraine. May repeat in 2 hours if headache persists or recurs. (Patient not taking: Reported on 05/26/2023) 36 tablet 3      Review of Systems   All systems reviewed and negative except as stated in  HPI  Blood pressure 111/66, pulse (!) 119, temperature 98.7 F (37.1 C), temperature source Oral, resp. rate 18, height 4\' 11"  (1.499 m), weight 75.8 kg, last menstrual period 09/16/2022, unknown if currently breastfeeding. General appearance: alert, cooperative, and no distress Lungs: clear to auscultation bilaterally Heart: regular rate and rhythm Abdomen: soft, non-tender; bowel sounds normal Pelvic: Exam deferred, SVE by  Extremities: Homans sign is negative, no sign of DVT DTR's WNL Presentation: cephalic Fetal monitoringBaseline: 130 bpm, Variability: Good {> 6 bpm), and Accelerations: Reactive Uterine activityNone Dilation: 1 Effacement (%): Thick Station: -3 Exam by:: Bryna Colander RN   Prenatal labs: ABO, Rh: --/--/PENDING (12/19 8119) Antibody: PENDING (12/19 0739) Rubella: <0.90 (06/28 1105) RPR: Non Reactive (10/18 0858)  HBsAg: Negative (06/28 1105)  HIV: Non Reactive (10/18 0858)  GBS: Negative/-- (12/12 1340)  1 hr Glucola: WNL Genetic screening: declined Anatomy US: WNL but EFW 10%  Prenatal Transfer Tool  Maternal Diabetes: No Genetic Screening: Declined Maternal Ultrasounds/Referrals: Normal Fetal Ultrasounds or other Referrals:  None Maternal Substance Abuse:  No Significant Maternal Medications:  Meds include: Other: Sumatriptan for migraine with aura Significant Maternal Lab Results:  Group B Strep negative Number of Prenatal Visits:greater than 3 verified prenatal visits Other Comments:  None  Results for orders placed or performed during the hospital encounter of 06/02/23 (from the past 24 hours)  Type and screen   Collection Time: 06/02/23  7:39 AM  Result Value Ref Range   ABO/RH(D) PENDING    Antibody Screen PENDING    Sample Expiration      06/05/2023,2359 Performed at [facility], [address] [place], [place] [address]   CBC   Collection Time: 06/02/23  7:40 AM  Result Value Ref Range   WBC 10.5 4.0 - 10.5 K/uL   RBC 4.02  3.87 - 5.11 MIL/uL   Hemoglobin 10.8 (L) 12.0 - 15.0 g/dL   HCT 95.6 (L) 21.3 - 08.6 %   MCV 84.1 80.0 - 100.0 fL   MCH 26.9 26.0 - 34.0 pg   MCHC 32.0 30.0 - 36.0 g/dL   RDW 57.8 46.9 - 62.9 %   Platelets 302 150 - 400 K/uL   nRBC 0.0 0.0 - 0.2 %    Patient Active Problem List   Diagnosis Date Noted   Encounter for induction of labor 06/02/2023   IUGR (intrauterine growth restriction) affecting care of mother 02/11/2023   Supervision of high-risk pregnancy 12/10/2022  Obesity in pregnancy 12/10/2022   BMI 30.0-30.9,adult 12/10/2022   Abnormal Papanicolaou smear of cervix 07/22/2020   Anxiety and depression 05/10/2018   Rubella non-immune status, antepartum 12/19/2014   Migraine without aura 04/25/2012    Assessment/Plan:  Yvonne Hood is a 27 y.o. [id number] at [redacted]w[redacted]d here for IOL for IUGR (3%ile)  #Labor:Received of PO cytotec on arrival, will monitor response and reassess around noon for more cytotec vs FB placement #Pain: None at present  #FWB: Cat 1 #ID:  GBS negative #MOF: Bottle #MOC:BTL  #Circ:  N/a girl   #Rubella NI: PP vaccination  #Anxiety/depression: early PP depression screening   J Dorothyann Gibbs, MD  06/02/2023, 8:32 AM

## 2023-06-03 LAB — CBC
HCT: 34.9 % — ABNORMAL LOW (ref 36.0–46.0)
Hemoglobin: 11.3 g/dL — ABNORMAL LOW (ref 12.0–15.0)
MCH: 27 pg (ref 26.0–34.0)
MCHC: 32.4 g/dL (ref 30.0–36.0)
MCV: 83.3 fL (ref 80.0–100.0)
Platelets: 311 10*3/uL (ref 150–400)
RBC: 4.19 MIL/uL (ref 3.87–5.11)
RDW: 13.9 % (ref 11.5–15.5)
WBC: 19.8 10*3/uL — ABNORMAL HIGH (ref 4.0–10.5)
nRBC: 0 % (ref 0.0–0.2)

## 2023-06-03 MED ORDER — SODIUM CHLORIDE 0.9 % IV SOLN: 250.0000 mL | INTRAVENOUS | Status: DC | PRN

## 2023-06-03 MED ORDER — PRENATAL MULTIVITAMIN CH: 1.0000 | ORAL_TABLET | Freq: Every day | ORAL | Status: DC

## 2023-06-03 MED ORDER — DIBUCAINE (PERIANAL) 1 % EX OINT: 1.0000 | TOPICAL_OINTMENT | CUTANEOUS | Status: DC | PRN

## 2023-06-03 MED ORDER — DIPHENHYDRAMINE HCL 25 MG PO CAPS: 25.0000 mg | ORAL_CAPSULE | Freq: Four times a day (QID) | ORAL | Status: DC | PRN

## 2023-06-03 MED ORDER — SODIUM CHLORIDE 0.9% FLUSH
3.0000 mL | Freq: Two times a day (BID) | INTRAVENOUS | Status: DC
  Administered 2023-06-03 – 2023-06-04 (×2): 3 mL via INTRAVENOUS

## 2023-06-03 MED ORDER — BENZOCAINE-MENTHOL 20-0.5 % EX AERO
1.0000 | INHALATION_SPRAY | CUTANEOUS | Status: DC | PRN
  Administered 2023-06-03: 1 via TOPICAL
  Filled 2023-06-03: qty 56

## 2023-06-03 MED ORDER — ONDANSETRON HCL 4 MG/2ML IJ SOLN: 4.0000 mg | INTRAMUSCULAR | Status: DC | PRN

## 2023-06-03 MED ORDER — LACTATED RINGERS IV SOLN: INTRAVENOUS | Status: AC

## 2023-06-03 MED ORDER — WITCH HAZEL-GLYCERIN EX PADS: 1.0000 | MEDICATED_PAD | CUTANEOUS | Status: DC | PRN

## 2023-06-03 MED ORDER — OXYCODONE HCL 5 MG PO TABS
10.0000 mg | ORAL_TABLET | ORAL | Status: DC | PRN
  Administered 2023-06-03 – 2023-06-05 (×4): 10 mg via ORAL
  Filled 2023-06-03 (×4): qty 2

## 2023-06-03 MED ORDER — COCONUT OIL OIL: 1.0000 | TOPICAL_OIL | Status: DC | PRN

## 2023-06-03 MED ORDER — ZOLPIDEM TARTRATE 5 MG PO TABS: 5.0000 mg | ORAL_TABLET | Freq: Every evening | ORAL | Status: DC | PRN

## 2023-06-03 MED ORDER — SENNOSIDES-DOCUSATE SODIUM 8.6-50 MG PO TABS
2.0000 | ORAL_TABLET | ORAL | Status: DC
  Filled 2023-06-03: qty 2

## 2023-06-03 MED ORDER — MEASLES, MUMPS & RUBELLA VAC IJ SOLR
0.5000 mL | Freq: Once | INTRAMUSCULAR | Status: AC
  Administered 2023-06-05: 0.5 mL via SUBCUTANEOUS
  Filled 2023-06-03: qty 0.5

## 2023-06-03 MED ORDER — ONDANSETRON HCL 4 MG PO TABS
4.0000 mg | ORAL_TABLET | ORAL | Status: DC | PRN
Start: 2023-06-03 — End: 2023-06-05

## 2023-06-03 MED ORDER — SIMETHICONE 80 MG PO CHEW: 80.0000 mg | CHEWABLE_TABLET | ORAL | Status: DC | PRN

## 2023-06-03 MED ORDER — SODIUM CHLORIDE 0.9% FLUSH: 3.0000 mL | INTRAVENOUS | Status: DC | PRN

## 2023-06-03 MED ORDER — TETANUS-DIPHTH-ACELL PERTUSSIS 5-2.5-18.5 LF-MCG/0.5 IM SUSY
0.5000 mL | PREFILLED_SYRINGE | Freq: Once | INTRAMUSCULAR | Status: AC
  Administered 2023-06-05: 0.5 mL via INTRAMUSCULAR
  Filled 2023-06-03: qty 0.5

## 2023-06-03 MED ORDER — FAMOTIDINE 20 MG PO TABS
40.0000 mg | ORAL_TABLET | Freq: Once | ORAL | Status: AC
  Administered 2023-06-03: 40 mg via ORAL
  Filled 2023-06-03: qty 2

## 2023-06-03 MED ORDER — METOCLOPRAMIDE HCL 10 MG PO TABS
10.0000 mg | ORAL_TABLET | Freq: Once | ORAL | Status: AC
  Administered 2023-06-03: 10 mg via ORAL
  Filled 2023-06-03: qty 1

## 2023-06-03 MED ORDER — ACETAMINOPHEN 10 MG/ML IV SOLN
1000.0000 mg | Freq: Once | INTRAVENOUS | Status: AC
  Administered 2023-06-03: 1000 mg via INTRAVENOUS
  Filled 2023-06-03: qty 100

## 2023-06-03 MED ORDER — ACETAMINOPHEN 325 MG PO TABS
650.0000 mg | ORAL_TABLET | ORAL | Status: DC | PRN
Start: 2023-06-03 — End: 2023-06-05
  Administered 2023-06-03 – 2023-06-05 (×8): 650 mg via ORAL
  Filled 2023-06-03 (×10): qty 2

## 2023-06-03 NOTE — Anesthesia Postprocedure Evaluation (Signed)
Anesthesia Post Note  Patient: Yvonne Hood  Procedure(s) Performed: AN AD HOC LABOR EPIDURAL     Patient location during evaluation: Mother Baby Anesthesia Type: Epidural Level of consciousness: awake and alert and oriented Pain management: satisfactory to patient Vital Signs Assessment: post-procedure vital signs reviewed and stable Respiratory status: respiratory function stable Cardiovascular status: stable Postop Assessment: no headache, no backache, epidural receding, patient able to bend at knees, no signs of nausea or vomiting, adequate PO intake and able to ambulate Anesthetic complications: no   No notable events documented.  Last Vitals:  Vitals:   06/03/23 0130 06/03/23 0430  BP: 113/72 124/72  Pulse: 99 85  Resp: 16 16  Temp: 37.1 C 36.7 C  SpO2: 97% 99%    Last Pain:  Vitals:   06/03/23 0832  TempSrc:   PainSc: 7    Pain Goal:                   Magie Ciampa

## 2023-06-03 NOTE — Progress Notes (Addendum)
POSTPARTUM PROGRESS NOTE  Post Partum Day 1  Subjective:  Yvonne Hood is a 27 y.o. [phone] s/p SVD at [redacted]w[redacted]d.  She reports she is doing well. No acute events overnight. She denies any problems with ambulating, voiding or po intake. Denies nausea or vomiting.  Pain is well controlled.  Lochia is appropriate.  Objective: Blood pressure 124/72, pulse 85, temperature 98.1 F (36.7 C), temperature source Oral, resp. rate 16, height 4\' 11"  (1.499 m), weight 75.8 kg, last menstrual period 09/16/2022, SpO2 99%, unknown if currently breastfeeding.  Physical Exam:  General: alert, cooperative and no distress Chest: no respiratory distress Heart:regular rate, distal pulses intact Uterine Fundus: firm, appropriately tender DVT Evaluation: No calf swelling or tenderness Extremities: trace edema Skin: warm, dry  Recent Labs    06/02/23 0740 06/03/23 0444  HGB 10.8* 11.3*  HCT 33.8* 34.9*    Assessment/Plan: Yvonne Hood is a 27 y.o. [phone] s/p SVD at [redacted]w[redacted]d   PPD#1 - Doing well  Routine postpartum care Contraception: Desires BTL, NPO since MN, unposted at this time pending OR availability today  RNI -  ordered MMR  Feeding: bottle Dispo: Plan for discharge 12/21 following BTL and if meeting goals.   LOS: 1 day   Hessie Dibble, MD OB Fellow  06/03/2023, 7:11 AM

## 2023-06-03 NOTE — Social Work (Signed)
CSW received consult for hx of Anxiety and Depression.  CSW met with MOB to offer support and complete assessment. CSW entered the room, and observed MOB in bed resting the infant in the bassinet and FOB at bedside. CSW introduced self, CSW role and reason for visit. MOB was agreeable to visit and allowed FOB to remain in the room. CSW inquired about how MOB was feeling, MOB reported good. CSW inquired about MOB MH hx, MOB reported she has been diagnosed with Anxiety and depression for over 5 years. MOB reported her symptoms are manageable without medication or therapy. CSW assessed for safety, MOB denied any SI or HI. CSW provided education regarding the baby blues period vs. perinatal mood disorders, discussed treatment and gave resources for mental health follow up if concerns arise.  CSW recommends self-evaluation during the postpartum time period using the New Mom Checklist from Postpartum Progress and encouraged MOB to contact a medical professional if symptoms are noted at any time.  MOB identifier FOB as her support.    CSW provided review of Sudden Infant Death Syndrome (SIDS) precautions. MOB identified [facility] p[facility] for infant follow up care, MOB reported they have all necessary items for the infant including a bassinet and car seat.   CSW identifies no further need for intervention and no barriers to discharge at this time.  Wende Neighbors, LCSWA Clinical Social Worker [phone]

## 2023-06-03 NOTE — Progress Notes (Signed)
Called 1st call L&D at 2020.  Dr was in a delivery asked me to call back in 15 min b/c not emergency.   Called 1st call L&D at 2043.  A different dr answered, asked me to call back in 10 min.  Explained I had already called and waited.  Patient is painful, had 1000mg  IV tylenol at 1521.  Has tylenol 650mg  orders q4prn, computer is saying that it is too soon to give the tylenol.  Patient is saying that her pain is not being controlled by tylenol.  Chubbs called back at 2049, added oxy PO orders, did comform that patient is to still be NPO after 12am for BTL tomorrow.

## 2023-06-04 ENCOUNTER — Encounter (HOSPITAL_COMMUNITY)
Admission: RE | Disposition: A | Discharge status: Home / Self Care | Payer: Self-pay | Source: Home / Self Care | Attending: Obstetrics and Gynecology

## 2023-06-04 ENCOUNTER — Encounter (HOSPITAL_COMMUNITY): Payer: Self-pay | Admitting: Obstetrics and Gynecology

## 2023-06-04 ENCOUNTER — Inpatient Hospital Stay (HOSPITAL_COMMUNITY): Payer: Medicaid Other | Admitting: Certified Registered"

## 2023-06-04 ENCOUNTER — Other Ambulatory Visit: Payer: Self-pay

## 2023-06-04 DIAGNOSIS — Z302 Encounter for sterilization: Secondary | ICD-10-CM

## 2023-06-04 HISTORY — PX: TUBAL LIGATION: SHX77

## 2023-06-04 SURGERY — LIGATION, FALLOPIAN TUBE, POSTPARTUM
Anesthesia: Epidural

## 2023-06-04 MED ORDER — LIDOCAINE-EPINEPHRINE (PF) 2 %-1:200000 IJ SOLN
INTRAMUSCULAR | Status: DC | PRN
  Administered 2023-06-04: 5 mL via EPIDURAL
  Administered 2023-06-04: 10 mL via EPIDURAL

## 2023-06-04 MED ORDER — FENTANYL CITRATE (PF) 100 MCG/2ML IJ SOLN
25.0000 ug | INTRAMUSCULAR | Status: DC | PRN
  Administered 2023-06-04 (×2): 25 ug via INTRAVENOUS

## 2023-06-04 MED ORDER — MIDAZOLAM HCL 2 MG/2ML IJ SOLN
INTRAMUSCULAR | Status: DC | PRN
  Administered 2023-06-04: 1 mg via INTRAVENOUS
  Administered 2023-06-04: .5 mg via INTRAVENOUS

## 2023-06-04 MED ORDER — FENTANYL CITRATE (PF) 100 MCG/2ML IJ SOLN
INTRAMUSCULAR | Status: AC
  Filled 2023-06-04: qty 2

## 2023-06-04 MED ORDER — FAMOTIDINE 20 MG PO TABS
40.0000 mg | ORAL_TABLET | Freq: Once | ORAL | Status: AC
  Administered 2023-06-04: 40 mg via ORAL
  Filled 2023-06-04: qty 2

## 2023-06-04 MED ORDER — FENTANYL CITRATE (PF) 100 MCG/2ML IJ SOLN
INTRAMUSCULAR | Status: DC | PRN
  Administered 2023-06-04 (×3): 25 ug via INTRAVENOUS

## 2023-06-04 MED ORDER — DEXAMETHASONE SODIUM PHOSPHATE 10 MG/ML IJ SOLN
INTRAMUSCULAR | Status: DC | PRN
  Administered 2023-06-04: 10 mg via INTRAVENOUS

## 2023-06-04 MED ORDER — LACTATED RINGERS IV SOLN: INTRAVENOUS | Status: AC

## 2023-06-04 MED ORDER — DEXAMETHASONE SODIUM PHOSPHATE 10 MG/ML IJ SOLN
INTRAMUSCULAR | Status: AC
  Filled 2023-06-04: qty 1

## 2023-06-04 MED ORDER — ONDANSETRON HCL 4 MG/2ML IJ SOLN
INTRAMUSCULAR | Status: DC | PRN
  Administered 2023-06-04: 4 mg via INTRAVENOUS

## 2023-06-04 MED ORDER — METOCLOPRAMIDE HCL 10 MG PO TABS
10.0000 mg | ORAL_TABLET | Freq: Once | ORAL | Status: AC
  Administered 2023-06-04: 10 mg via ORAL
  Filled 2023-06-04: qty 1

## 2023-06-04 MED ORDER — MIDAZOLAM HCL 2 MG/2ML IJ SOLN
INTRAMUSCULAR | Status: AC
  Filled 2023-06-04: qty 2

## 2023-06-04 SURGICAL SUPPLY — 23 items
BLADE SURG 15 STRL LF C SS BP (BLADE) ×1 IMPLANT
CLOTH BEACON ORANGE TIMEOUT ST (SAFETY) ×1 IMPLANT
DERMABOND ADVANCED .7 DNX12 (GAUZE/BANDAGES/DRESSINGS) ×1 IMPLANT
DRSG OPSITE POSTOP 3X4 (GAUZE/BANDAGES/DRESSINGS) ×1 IMPLANT
ELECT REM PT RETURN 9FT ADLT (ELECTROSURGICAL)
ELECTRODE REM PT RTRN 9FT ADLT (ELECTROSURGICAL) IMPLANT
GLOVE BIOGEL PI IND STRL 7.0 (GLOVE) ×1 IMPLANT
GLOVE BIOGEL PI IND STRL 8 (GLOVE) ×1 IMPLANT
GLOVE ECLIPSE 8.0 STRL XLNG CF (GLOVE) ×1 IMPLANT
GOWN STRL REUS W/TWL LRG LVL3 (GOWN DISPOSABLE) ×2 IMPLANT
NEEDLE HYPO 22GX1.5 SAFETY (NEEDLE) IMPLANT
NS IRRIG 1000ML POUR BTL (IV SOLUTION) ×1 IMPLANT
PACK ABDOMINAL MINOR (CUSTOM PROCEDURE TRAY) ×1 IMPLANT
PENCIL BUTTON HOLSTER BLD 10FT (ELECTRODE) IMPLANT
PROTECTOR NERVE ULNAR (MISCELLANEOUS) ×1 IMPLANT
SPONGE LAP 4X18 RFD (DISPOSABLE) IMPLANT
SUT PLAIN ABS 2-0 CT1 27XMFL (SUTURE) ×2 IMPLANT
SUT VIC AB 0 CT1 27XBRD ANBCTR (SUTURE) ×1 IMPLANT
SUT VIC AB 3-0 SH 27XBRD (SUTURE) IMPLANT
SUT VIC AB 4-0 KS 27 (SUTURE) ×1 IMPLANT
SYR CONTROL 10ML LL (SYRINGE) IMPLANT
TOWEL OR 17X24 6PK STRL BLUE (TOWEL DISPOSABLE) ×2 IMPLANT
TRAY FOLEY CATH SILVER 14FR (SET/KITS/TRAYS/PACK) ×1 IMPLANT

## 2023-06-04 NOTE — Transfer of Care (Signed)
Immediate Anesthesia Transfer of Care Note  Patient: Yvonne Hood  Procedure(s) Performed: POST PARTUM TUBAL LIGATION  Patient Location: PACU  Anesthesia Type:Epidural  Level of Consciousness: awake, alert , and oriented  Airway & Oxygen Therapy: Patient Spontanous Breathing and Patient connected to nasal cannula oxygen  Post-op Assessment: Report given to RN and Post -op Vital signs reviewed and stable  Post vital signs: Reviewed and stable  Last Vitals:  Vitals Value Taken Time  BP 118/79 06/04/23 1346  Temp    Pulse 102 06/04/23 1352  Resp 14 06/04/23 1352  SpO2 98 % 06/04/23 1352  Vitals shown include unfiled device data.  Last Pain:  Vitals:   06/04/23 1103  TempSrc:   PainSc: 0-No pain         Complications: No notable events documented.

## 2023-06-04 NOTE — Anesthesia Preprocedure Evaluation (Signed)
Anesthesia Evaluation  Patient identified by MRN, date of birth, ID band Patient awake    Reviewed: Allergy & Precautions, NPO status , Patient's Chart, lab work & pertinent test results  Airway Mallampati: II  TM Distance: >3 FB Neck ROM: Full    Dental no notable dental hx.    Pulmonary neg pulmonary ROS   Pulmonary exam normal breath sounds clear to auscultation       Cardiovascular negative cardio ROS Normal cardiovascular exam Rhythm:Regular Rate:Normal     Neuro/Psych  Headaches PSYCHIATRIC DISORDERS Anxiety Depression       GI/Hepatic negative GI ROS, Neg liver ROS,,,  Endo/Other  negative endocrine ROS    Renal/GU negative Renal ROS  negative genitourinary   Musculoskeletal negative musculoskeletal ROS (+)    Abdominal   Peds  Hematology negative hematology ROS (+)   Anesthesia Other Findings PPTL, epidural worked well for labor  Reproductive/Obstetrics                             Anesthesia Physical Anesthesia Plan  ASA: 2  Anesthesia Plan: Epidural   Post-op Pain Management:    Induction:   PONV Risk Score and Plan: 2 and Treatment may vary due to age or medical condition, Ondansetron and Midazolam  Airway Management Planned: Natural Airway and Simple Face Mask  Additional Equipment:   Intra-op Plan:   Post-operative Plan:   Informed Consent: I have reviewed the patients History and Physical, chart, labs and discussed the procedure including the risks, benefits and alternatives for the proposed anesthesia with the patient or authorized representative who has indicated his/her understanding and acceptance.       Plan Discussed with: Anesthesiologist and CRNA  Anesthesia Plan Comments: (Plan to use pre-existing epidural. )       Anesthesia Quick Evaluation

## 2023-06-04 NOTE — Anesthesia Postprocedure Evaluation (Signed)
Anesthesia Post Note  Patient: Yvonne Hood  Procedure(s) Performed: POST PARTUM TUBAL LIGATION     Patient location during evaluation: PACU Anesthesia Type: Epidural Level of consciousness: oriented and awake and alert Pain management: pain level controlled Vital Signs Assessment: post-procedure vital signs reviewed and stable Respiratory status: spontaneous breathing, respiratory function stable and patient connected to nasal cannula oxygen Cardiovascular status: blood pressure returned to baseline and stable Postop Assessment: no headache, no backache, no apparent nausea or vomiting and epidural receding Anesthetic complications: no  No notable events documented.  Last Vitals:  Vitals:   06/04/23 1528 06/04/23 1630  BP: 127/81 132/78  Pulse: 97 79  Resp: 16 17  Temp: 37.1 C 37.3 C  SpO2: 97% 97%    Last Pain:  Vitals:   06/04/23 1710  TempSrc:   PainSc: 1    Pain Goal: Patients Stated Pain Goal: 4 (06/04/23 1445)                 Child Campoy L Laporcha Marchesi

## 2023-06-04 NOTE — Progress Notes (Signed)
POSTPARTUM PROGRESS NOTE  Subjective: Yvonne Hood is a 27 y.o. [phone] s/p SVD at [redacted]w[redacted]d.  She reports she doing well. No acute events overnight. She denies any problems with ambulating, voiding or po intake. Denies nausea or vomiting. She has  passed flatus. Pain is well controlled.  Lochia is appropriate "like a period".  She desires PP BTL and is scheduled and RN has prepped her for the procedure.   Objective: Blood pressure 118/82, pulse 82, temperature 98.1 F (36.7 C), temperature source Oral, resp. rate 18, height 4\' 11"  (1.499 m), weight 75.8 kg, last menstrual period 09/16/2022, SpO2 100%, unknown if currently breastfeeding.  Physical Exam:  General: alert, cooperative and no distress Chest: no respiratory distress Abdomen: soft, non-tender  Uterine Fundus: firm, appropriately tender Extremities: No calf swelling or tenderness  trace edema  Recent Labs    06/02/23 0740 06/03/23 0444  HGB 10.8* 11.3*  HCT 33.8* 34.9*    Assessment/Plan: Yvonne Hood is a 27 y.o. [id number] s/p SVD at [redacted]w[redacted]d.  Routine Postpartum Care: Doing well, pain well-controlled.  -- Continue routine care, lactation support  -- Contraception: BTL, to be completed today -- Feeding: Bottle  Dispo: Plan for discharge today vs tomorrow.  LOS 2 days Lamont Snowball, MSN, CNM, RNC-OB Certified Nurse Midwife, [facility] 06/04/2023 11:18 AM

## 2023-06-04 NOTE — Op Note (Signed)
  Preoperative diagnosis:  Multiparous female who desires permanent sterilization  Postoperative diagnosis:  Same as above  Procedure:  Postpartum partum bilateral tubal ligation, nearly total salpingectomy bilaterally certainly the distal 80% of the tube + all fimbria  Surgeon:  Lazaro Arms    Anesthesia:    Findings:  Patient had a normal postpartum uterus tubes and ovaries.  Description of operation:  Patient was taken to the operating room where she had her epidural dosed.  She was then placed in the supine position.  She was then prepped and draped in the usual sterile fashion..  A semilunar incision was made just below the umbilicus and taken down sharply to the fascia which was incised.  The peritoneum was then entered manually.  The right fallopian tube was identified including the fimbriated end.  The right fallopian tube was grasped and removed distally nearly in total, 80% including the fimbriated end  Attention was then turned to the left fallopian tube which was identified including the fimbriated end. Same technique applied, distal 80% removed including all fimbria.  There was good hemostasis.    The subcutaneous tissue fascia and peritoneum were closed using 0 vicryl in a running fashion.  The subcutaneous tissue was reapproximated with interrupted 3-0 Monocryl sutures.  The skin was closed using 4-0 Vicryl on a Keith needle in a subcuticular fashion. Dermabond was placed for additional wound integrity and also to serve as a bandage.  The patient was taken to the recovery room in good stable condition.  All counts were correct.  Blood loss was minimal.   Lazaro Arms, MD 06/04/2023 1:34 PM

## 2023-06-05 DIAGNOSIS — Z9851 Tubal ligation status: Secondary | ICD-10-CM

## 2023-06-05 DIAGNOSIS — N871 Moderate cervical dysplasia: Secondary | ICD-10-CM

## 2023-06-05 MED ORDER — ACETAMINOPHEN 325 MG PO TABS: 650.0000 mg | ORAL_TABLET | ORAL | Status: DC | PRN

## 2023-06-05 MED ORDER — COCONUT OIL OIL: 1.0000 | TOPICAL_OIL | Status: DC | PRN

## 2023-06-05 MED ORDER — INFLUENZA VIRUS VACC SPLIT PF (FLUZONE) 0.5 ML IM SUSY
0.5000 mL | PREFILLED_SYRINGE | INTRAMUSCULAR | Status: AC
  Administered 2023-06-05: 0.5 mL via INTRAMUSCULAR
  Filled 2023-06-05: qty 0.5

## 2023-06-05 MED ORDER — WITCH HAZEL-GLYCERIN EX PADS: 1.0000 | MEDICATED_PAD | CUTANEOUS | Status: DC | PRN

## 2023-06-05 MED ORDER — SIMETHICONE 80 MG PO CHEW: 80.0000 mg | CHEWABLE_TABLET | ORAL | Status: DC | PRN

## 2023-06-05 MED ORDER — OXYCODONE HCL 10 MG PO TABS: 10.0000 mg | ORAL_TABLET | ORAL | 0 refills | Status: AC | PRN

## 2023-06-05 NOTE — Lactation Note (Signed)
This note was copied from a baby's chart. Lactation Consultation Note  Patient Name: Yvonne Hood [id number] Date: 06/05/2023 Age:28 hours Reason for consult: Initial assessment;Early term 37-38.6wks;Infant < 6lbs;Infant weight loss  P2- MOB is formula feeding only. Reception informed LC that this MOB was waiting for a feeding evaluation in order to be discharged. When LC entered the room, MOB reported that she was waiting on a speech evaluation because she is formula feeding only. LC called SLP to inform her, but SLP was not able to see this feeding due to conflicting scheduling. LC offered to assist with the evaluation.  LC placed infant in a side-lying pace feeding position. Infant was in a semi-alert mode, but took to the bottle immediately. LC offered the Similac Neosure 22 kcal with a white Nfant nipple. Infant had an appropriate suck, swallow breathe rhythm. No formula was leaking for infant's mouth. Infant did not cough, choke or act like the flow was too much for her. In the 20 minute feeding, infant took a total of 25 mL. Infant was then placed in MOB's arms. LC informed the SLP and NP of this assessment.   Feeding Mother's Current Feeding Choice: Formula Nipple Type: Nfant Standard Flow (white)  Consult Status Consult Status: Complete Date: 06/05/23    Dema Severin BS, IBCLC 06/05/2023, 3:28 PM

## 2023-06-06 ENCOUNTER — Other Ambulatory Visit: Payer: Medicaid Other

## 2023-06-07 LAB — SURGICAL PATHOLOGY

## 2023-06-09 ENCOUNTER — Telehealth (HOSPITAL_COMMUNITY): Payer: Self-pay | Admitting: *Deleted

## 2023-06-09 ENCOUNTER — Ambulatory Visit: Payer: Medicaid Other

## 2023-06-09 NOTE — Telephone Encounter (Signed)
06/09/2023  Name: Yvonne Hood MRN: [id number] DOB: [birth year]  Reason for Call:  Transition of Care Hospital Discharge Call  Contact Status: Patient Contact Status: Complete  Language assistant needed: Interpreter Mode: Interpreter Not Needed        Follow-Up Questions: Do You Have Any Concerns About Your Health As You Heal From Delivery?: No Do You Have Any Concerns About Your Infants Health?: No  Edinburgh Postnatal Depression Scale:  In the Past 7 Days: I have been able to laugh and see the funny side of things.: As much as I always could I have looked forward with enjoyment to things.: Rather less than I used to I have blamed myself unnecessarily when things went wrong.: Not very often I have been anxious or worried for no good reason.: Yes, sometimes I have felt scared or panicky for no good reason.: No, not much Things have been getting on top of me.: No, I have been coping as well as ever I have been so unhappy that I have had difficulty sleeping.: Not at all I have felt sad or miserable.: No, not at all I have been so unhappy that I have been crying.: No, never The thought of harming myself has occurred to me.: Never Edinburgh Postnatal Depression Scale Total: 5  PHQ2-9 Depression Scale:     Discharge Follow-up: Edinburgh score requires follow up?: No Patient was advised of the following resources:: Support Group, Breastfeeding Support Group  Post-discharge interventions: Reviewed Newborn Safe Sleep Practices  Salena Saner, RN 06/09/2023 17:01

## 2023-06-13 ENCOUNTER — Other Ambulatory Visit: Payer: Medicaid Other

## 2023-07-14 ENCOUNTER — Ambulatory Visit: Payer: Medicaid Other | Admitting: Obstetrics & Gynecology

## 2023-07-14 ENCOUNTER — Encounter: Payer: Self-pay | Admitting: Obstetrics & Gynecology

## 2023-07-14 DIAGNOSIS — R87610 Atypical squamous cells of undetermined significance on cytologic smear of cervix (ASC-US): Secondary | ICD-10-CM

## 2023-07-14 DIAGNOSIS — Z9851 Tubal ligation status: Secondary | ICD-10-CM

## 2023-07-14 DIAGNOSIS — R87810 Cervical high risk human papillomavirus (HPV) DNA test positive: Secondary | ICD-10-CM | POA: Diagnosis not present

## 2023-07-14 NOTE — Progress Notes (Signed)
Post Partum Visit Note  Yvonne Hood is a 28 y.o. [phone] female who presents for a postpartum visit. She is 6 weeks postpartum following a normal spontaneous vaginal delivery.  I have fully reviewed the prenatal and intrapartum course. The delivery was at 37 gestational weeks.  Anesthesia: epidural. Postpartum course has been uncomplicated. Baby is doing well. Baby is feeding by bottle - Similac Neosure. Bleeding moderate- heavy, currently on her menstrual period. Bowel function is normal. Bladder function is normal. Patient is not sexually active. Contraception method is tubal ligation.   Postpartum depression screening: negative, score 2.  The pregnancy intention screening data noted above was reviewed. Potential methods of contraception were discussed. The patient elected to proceed with No data recorded.   Edinburgh Postnatal Depression Scale - 07/14/23 1455       Edinburgh Postnatal Depression Scale:  In the Past 7 Days   I have been able to laugh and see the funny side of things. 0    I have looked forward with enjoyment to things. 0    I have blamed myself unnecessarily when things went wrong. 1    I have been anxious or worried for no good reason. 0    I have felt scared or panicky for no good reason. 0    Things have been getting on top of me. 1    I have been so unhappy that I have had difficulty sleeping. 0    I have felt sad or miserable. 0    I have been so unhappy that I have been crying. 0    The thought of harming myself has occurred to me. 0    Edinburgh Postnatal Depression Scale Total 2             Health Maintenance Due  Topic Date Due   COVID-19 Vaccine (1 - 2024-25 season) Never done    The following portions of the patient's history were reviewed and updated as appropriate: allergies, current medications, past family history, past medical history, past social history, past surgical history, and problem list.  Review of Systems Pertinent items  noted in HPI and remainder of comprehensive ROS otherwise negative.  Objective:  BP 119/82   Pulse 76   Wt 155 lb (70.3 kg)   LMP 07/06/2023   Breastfeeding No   BMI 31.31 kg/m    General:  alert and no distress   Breasts:  not indicated  Lungs: clear to auscultation bilaterally  Heart:  regular rate and rhythm  Abdomen: soft, non-tender; bowel sounds normal; no masses,  no organomegaly   Wound well approximated incision, no erythema, no induration,   GU exam:  not indicated       Assessment:   1. 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 S/P tubal ligation 3. Postpartum care following vaginal delivery   Plan:   Essential components of care per ACOG recommendations:  1.  Mood and well being: Patient with negative depression screening today. Reviewed local resources for support.  - Patient tobacco use? No.   - hx of drug use? No.    2. Infant care and feeding:  -Patient currently breastmilk feeding? No.  -Social determinants of health (SDOH) reviewed in EPIC. No concerns.  3. Sexuality, contraception and birth spacing - Patient underwent permanent sterilization.  4. Sleep and fatigue -Encouraged family/partner/community support of 4 hrs of uninterrupted sleep to help with mood and fatigue  5. Physical Recovery  - Discussed  patients delivery and complications. She describes her labor as good. - Patient had a Vaginal, no problems at delivery. Patient had no laceration. Perineal healing reviewed. Patient expressed understanding - Patient has urinary incontinence? No. - Patient is safe to resume physical and sexual activity  6.  Health Maintenance - HM due items addressed Yes - Last pap smear  Diagnosis  Date Value Ref Range Status  12/10/2022 (A)  Final   - Atypical squamous cells of undetermined significance (ASC-US)  - Had positive HRHPV, deferred colposcopy in pregnancy. Since pap is over 6 months ago and was ASCUS, can repeat pap now in lieu of  colposcopy.  Unfortunately, pap smear not done at today's visit due to reported heavy menstrual bleeding, patient to return for pap smear. - If next pap smear is abnormal, plan to proceed to colposcopy    Return in about 2 weeks (around 07/28/2023) for Pap smear only - had ASCUS +HPV 12/10/22.    Jaynie Collins, MD [facility] [employer], [facility]

## 2023-07-20 ENCOUNTER — Ambulatory Visit: Payer: Medicaid Other

## 2023-08-02 ENCOUNTER — Ambulatory Visit: Payer: Medicaid Other | Admitting: Obstetrics and Gynecology

## 2023-08-15 ENCOUNTER — Encounter: Payer: Self-pay | Admitting: *Deleted

## 2023-08-24 ENCOUNTER — Other Ambulatory Visit (HOSPITAL_COMMUNITY)
Admission: RE | Admit: 2023-08-24 | Discharge: 2023-08-24 | Disposition: A | Discharge status: Home / Self Care | Source: Ambulatory Visit | Attending: Obstetrics and Gynecology | Admitting: Obstetrics and Gynecology

## 2023-08-24 ENCOUNTER — Ambulatory Visit: Payer: Medicaid Other | Admitting: Obstetrics and Gynecology

## 2023-08-24 VITALS — BP 125/74 | HR 83 | Wt 161.2 lb

## 2023-08-24 DIAGNOSIS — N871 Moderate cervical dysplasia: Secondary | ICD-10-CM | POA: Insufficient documentation

## 2023-08-24 DIAGNOSIS — Z8742 Personal history of other diseases of the female genital tract: Secondary | ICD-10-CM

## 2023-08-24 DIAGNOSIS — R87810 Cervical high risk human papillomavirus (HPV) DNA test positive: Secondary | ICD-10-CM

## 2023-08-24 NOTE — Procedures (Signed)
 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 Cervical exam performed in the presence of a chaperone The risks (including infection, bleeding, pain) and benefits of the procedure were explained to the patient and written informed consent was obtained.  The patient was placed in the dorsal lithotomy position. A Graves was speculum inserted in the vagina, and the cervix was visualized.  Acetic acid staining was done and the cervix was viewed with green filter.  Biopsy from all four quadrants done and then single toothed tenaculum applied and endocervical curettage in all four quadrants done. There was no bleeding after procedure.   Findings: diffuse awe changes diffusely and circumferentially and increased vascularity diffusely and circumferentially, as well.   Adequate: yes  Specimens: 10, 2, 4 and 8 o'clock cervical biopsies (sent together) and endocervical curettage  Condition: Stable  Complications: None  Plan: The patient was advised to call for any fever or for prolonged or severe pain or bleeding. She was advised to use OTC analgesics as needed for mild to moderate pain. She was advised to avoid vaginal intercourse for 48 hours or until the bleeding has completely stopped.   Cornelia Copa MD Attending [facility] [employer] [facility])

## 2023-08-24 NOTE — Progress Notes (Signed)
 CC: pap abnormal 2024   Birth control: tubal

## 2023-08-29 ENCOUNTER — Encounter: Payer: Self-pay | Admitting: Obstetrics and Gynecology

## 2023-08-29 HISTORY — PX: COLPOSCOPY: PRO47

## 2023-08-29 LAB — SURGICAL PATHOLOGY

## 2023-08-29 NOTE — Progress Notes (Signed)
 Obstetrics and Gynecology Visit Return Patient Evaluation  Appointment Date: 08/24/2023  Primary Care Provider: Delton Prairie  OBGYN Clinic: [facility]  Chief Complaint: follow up new OB ASCUS/HPV+ June 2024 pap smear  History of Present Illness:  Yvonne Hood is a 28 y.o. with above chief complaint. Patient delivered on 12/19 and had PP visit on 1/30 with Dr. Macon Large with plan for 2 week RTC visit for colpo.    12/10/2022 pregnancy pap smear: ascus/hpv+, 16-18-45 neg>>plan for postpartum colpo 07/09/2020 colpo (adequate): CIN 1 biopsy with focal features suggestive of CIN 2, ECC negative Cervix, biopsy 06/04/2020 pap: LSIL, HR HPV positive, subtyping not done  Interval History: Since that time, she states that she's doing well and w/o any issues.  Review of Systems:  as noted in the History of Present Illness.  Patient Active Problem List   Diagnosis Date Noted   BMI 30.0-30.9,adult 12/10/2022   Abnormal Papanicolaou smear of cervix 07/22/2020   Anxiety and depression 05/10/2018   Medications:  Gabriel Carina had no medications administered during this visit. Current Outpatient Medications  Medication Sig Dispense Refill   acetaminophen-caffeine (EXCEDRIN TENSION HEADACHE) 500-65 MG TABS per tablet Take 2 tablets by mouth every 6 (six) hours as needed. (Patient not taking: Reported on 07/14/2023) 100 tablet 0   SUMAtriptan (IMITREX) 100 MG tablet Take 1 tablet (100 mg total) by mouth once as needed for up to 1 dose for migraine. May repeat in 2 hours if headache persists or recurs. (Patient not taking: Reported on 07/14/2023) 36 tablet 3   No current facility-administered medications for this visit.    Allergies: is allergic to bee venom and ibuprofen.  Physical Exam:  BP 125/74   Pulse 83   Wt 161 lb 3.2 oz (73.1 kg)   LMP 08/05/2023 (Approximate)   BMI 32.56 kg/m  Body mass index is 32.56 kg/m. General appearance: Well  nourished, well developed female in no acute distress.  Neuro/Psych:  Normal mood and affect.    Pelvic exam:  See procedure visit for colpo  Assessment: patient doing well  Plan:  1. Dysplasia of cervix, high grade CIN 2 (Primary) D/w her re: dysplasia and I recommend doing another colpo which she is amenable to.  - Surgical pathology( [facility]/ [facility])   RTC: follow up final pathology   Cornelia Copa MD Attending Center for [facility])

## 2023-08-30 ENCOUNTER — Telehealth: Payer: Self-pay | Admitting: *Deleted

## 2023-08-30 NOTE — Telephone Encounter (Signed)
 Called pt to inform her of colpo results and that Dr Vergie Living recommends a LEEP, appt scheduled for 09/22/23.

## 2023-08-30 NOTE — Telephone Encounter (Signed)
-----   Message from [place] sent at 08/29/2023 10:38 AM EDT ----- Can you let her know that I recommend she undergo a LEEP procedure and set that up for her? thanks

## 2023-09-22 ENCOUNTER — Encounter: Admitting: Obstetrics and Gynecology

## 2023-10-03 ENCOUNTER — Other Ambulatory Visit (HOSPITAL_COMMUNITY)
Admission: RE | Admit: 2023-10-03 | Discharge: 2023-10-03 | Disposition: A | Discharge status: Home / Self Care | Source: Ambulatory Visit | Attending: Obstetrics and Gynecology | Admitting: Obstetrics and Gynecology

## 2023-10-03 ENCOUNTER — Encounter: Payer: Self-pay | Admitting: Obstetrics and Gynecology

## 2023-10-03 ENCOUNTER — Ambulatory Visit (INDEPENDENT_AMBULATORY_CARE_PROVIDER_SITE_OTHER): Admitting: Obstetrics and Gynecology

## 2023-10-03 VITALS — BP 122/83 | HR 81 | Wt 161.0 lb

## 2023-10-03 DIAGNOSIS — N871 Moderate cervical dysplasia: Secondary | ICD-10-CM

## 2023-10-03 HISTORY — PX: LEEP: PRO1013

## 2023-10-03 NOTE — Procedures (Signed)
 Loop Electrosurgical Excisional Procedure Note  Pre-operative Diagnosis:  History of BTL 08/24/2023 colpo (adequate) focal cin 2. Cin 1. ECC cin1 12/10/2022 pregnancy pap smear: ascus/hpv+, 16-18-45 neg>>plan for postpartum colpo 07/09/2020 colpo (adequate): CIN 1 biopsy with focal features suggestive of CIN 2, ECC negative Cervix, biopsy 06/04/2020 pap: LSIL, HR HPV positive, subtyping not done  Post-operative Diagnosis: CIN 2  Procedure Details    The risks (including infection, bleeding, pain, preterm birth, cervical stenosis) and benefits of the procedure were explained to the patient and written informed consent was obtained.  The patient was placed in the dorsal lithotomy position. A coated Graves was speculum inserted in the vagina, and the cervix was visualized.  A colposcopy was performed with acetic acid and lugol's staining, with the below noted findings. The cervical stromal bed was injected with 10mL of 1% lidocaine  with epinephrine . Entering at 12 o'clock on the cervix and using a large shallow Fischer Loop and a setting of 50/50 blend current, a LEEP was done, in on circumferential fashion.  Next, an ECC was done and hemostasis achieved with the ball electrode on 50 coagulation current and application of Monsel's. On coagulation, the entire LEEP bed and surgical margins were thoroughly cauterized  Findings: diffuse awe changes diffusely and circumferentially and increased vascularity diffusely and circumferentially, as well.   Adequate: Yes  Specimens: LEEP specimen (circumferential, 360 degrees) and ECC   Condition: Stable  Complications: None  Plan: The patient was advised to call for any fever or for prolonged or severe pain or bleeding. She was advised to use OTC analgesics as needed for mild to moderate pain. Pelvic rest was advised until after her four week post operative visit.    Tyler Gallant MD Attending [facility] for [employer] [facility])

## 2023-10-06 LAB — SURGICAL PATHOLOGY

## 2023-10-31 ENCOUNTER — Other Ambulatory Visit (HOSPITAL_COMMUNITY)
Admission: RE | Admit: 2023-10-31 | Discharge: 2023-10-31 | Disposition: A | Discharge status: Home / Self Care | Source: Ambulatory Visit | Attending: Obstetrics and Gynecology | Admitting: Obstetrics and Gynecology

## 2023-10-31 ENCOUNTER — Ambulatory Visit: Admitting: Obstetrics and Gynecology

## 2023-10-31 VITALS — BP 109/72 | HR 84 | Wt 163.0 lb

## 2023-10-31 DIAGNOSIS — N898 Other specified noninflammatory disorders of vagina: Secondary | ICD-10-CM | POA: Diagnosis present

## 2023-10-31 DIAGNOSIS — N920 Excessive and frequent menstruation with regular cycle: Secondary | ICD-10-CM | POA: Diagnosis not present

## 2023-10-31 DIAGNOSIS — N871 Moderate cervical dysplasia: Secondary | ICD-10-CM

## 2023-10-31 NOTE — Progress Notes (Signed)
 Obstetrics and Gynecology Visit Return Patient Evaluation  Appointment Date: 10/31/2023  Primary Care Provider: Annell Barrow  OBGYN Clinic: [facility]  Chief Complaint: follow up LEEP  History of Present Illness:  Yvonne Hood is a 28 y.o. s/p 4/21 LEEP and ECC for prior colpo   Interval History: Since that time, she states that she's had a normal period since the LEEP and no new problems or concerns. 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   Periods: Patient states that since her December 2024 SVD and PP BTL her periods have been heavy. They are a week long, heavy, somewhat painful and helped with midol and regular. She is formula feeding.   Review of Systems:  as noted in the History of Present Illness.  Patient Active Problem List   Diagnosis Date Noted   Menorrhagia with regular cycle 10/31/2023   History of loop electrical excision procedure (LEEP) 06/05/2023   BMI 30.0-30.9,adult 12/10/2022   Abnormal Papanicolaou smear of cervix 07/22/2020   Anxiety and depression 05/10/2018   Medications:  Lamar Pillar had no medications administered during this visit. Current Outpatient Medications  Medication Sig Dispense Refill   acetaminophen -caffeine (EXCEDRIN  TENSION HEADACHE) 500-65 MG TABS per tablet Take 2 tablets by mouth every 6 (six) hours as needed. (Patient not taking: Reported on 05/26/2023) 100 tablet 0   oxyCODONE  10 MG TABS Take 1 tablet (10 mg total) by mouth every 4 (four) hours as needed for severe pain (pain score 7-10) or breakthrough pain. (Patient not taking: Reported on 10/31/2023) 30 tablet 0   SUMAtriptan  (IMITREX ) 100 MG tablet Take 1 tablet (100 mg total) by mouth once as needed for up to 1 dose for migraine. May repeat in 2 hours if headache persists or recurs. (Patient not taking: Reported on 05/26/2023) 36 tablet 3   No current facility-administered  medications for this visit.    Allergies: is allergic to bee venom and ibuprofen.  Physical Exam:  BP 109/72   Pulse 84   Wt 163 lb (73.9 kg)   LMP 10/14/2023 (Exact Date)   BMI 32.92 kg/m  Body mass index is 32.92 kg/m. General appearance: Well nourished, well developed female in no acute distress.  Neuro/Psych:  Normal mood and affect.    Pelvic exam:  Cervical exam performed in the presence of a chaperone EGBUS: wnl Vaginal vault: scant yellowish discharge Cervix:  well healed, nttp Bimanual: deferred   Assessment: patient doing well  Plan:  1. Menorrhagia with regular cycle (Primary) D/w her re: options, u/s; pt has nsaid allergy. D/w her and pt candidate for Lysteda. Pt to monitor s/s and let us  know if she'd like to do anything in the future 10 minutes  2. 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

## 2023-10-31 NOTE — Progress Notes (Signed)
 RGYN here for F/U LEEP.  LMP: 10/14/23  CC: Heavy periods lasting 1 wks soaking a pad in 1 hr or less since BTL.

## 2023-10-31 NOTE — Patient Instructions (Signed)
Lysteda

## 2023-11-01 LAB — CERVICOVAGINAL ANCILLARY ONLY
Bacterial Vaginitis (gardnerella): NEGATIVE
Candida Glabrata: NEGATIVE
Candida Vaginitis: NEGATIVE
Comment: NEGATIVE
Comment: NEGATIVE
Comment: NEGATIVE

## 2023-11-02 ENCOUNTER — Ambulatory Visit: Payer: Self-pay | Admitting: Obstetrics and Gynecology

## 2024-03-12 ENCOUNTER — Ambulatory Visit: Admitting: *Deleted

## 2024-03-12 ENCOUNTER — Other Ambulatory Visit (HOSPITAL_COMMUNITY)
Admission: RE | Admit: 2024-03-12 | Discharge: 2024-03-12 | Disposition: A | Discharge status: Home / Self Care | Source: Ambulatory Visit | Attending: Obstetrics & Gynecology | Admitting: Obstetrics & Gynecology

## 2024-03-12 DIAGNOSIS — Z113 Encounter for screening for infections with a predominantly sexual mode of transmission: Secondary | ICD-10-CM | POA: Insufficient documentation

## 2024-03-12 NOTE — Progress Notes (Signed)
 SUBJECTIVE:  28 y.o. female who desires a STI screen. Denies abnormal vaginal discharge, bleeding or significant pelvic pain. No UTI symptoms. Denies history of known exposure to STD.  No LMP recorded.  OBJECTIVE:  She appears well.   ASSESSMENT:  STI Screen   PLAN:  Pt offered STI blood screening-requested GC, chlamydia, and trichomonas probe sent to lab.  Treatment: To be determined once lab results are received.  Pt follow up as needed.  Wanda Buckles, RN

## 2024-03-13 ENCOUNTER — Ambulatory Visit: Payer: Self-pay | Admitting: Obstetrics & Gynecology

## 2024-03-13 LAB — RPR+HBSAG+HCVAB+...
HIV Screen 4th Generation wRfx: NONREACTIVE
Hep C Virus Ab: NONREACTIVE
Hepatitis B Surface Ag: NEGATIVE
RPR Ser Ql: NONREACTIVE

## 2024-03-13 LAB — CERVICOVAGINAL ANCILLARY ONLY
Chlamydia: NEGATIVE
Comment: NEGATIVE
Comment: NEGATIVE
Comment: NORMAL
Neisseria Gonorrhea: NEGATIVE
Trichomonas: NEGATIVE
